# Patient Record
Sex: Female | Born: 1935
Health system: Southern US, Community
[De-identification: ages and names within clinical notes are randomized; demographics above are authoritative.]

## PROBLEM LIST (undated history)

## (undated) DIAGNOSIS — D219 Benign neoplasm of connective and other soft tissue, unspecified: Secondary | ICD-10-CM

## (undated) DIAGNOSIS — E559 Vitamin D deficiency, unspecified: Secondary | ICD-10-CM

## (undated) DIAGNOSIS — M1990 Unspecified osteoarthritis, unspecified site: Secondary | ICD-10-CM

## (undated) DIAGNOSIS — I10 Essential (primary) hypertension: Secondary | ICD-10-CM

## (undated) DIAGNOSIS — K5790 Diverticulosis of intestine, part unspecified, without perforation or abscess without bleeding: Secondary | ICD-10-CM

## (undated) DIAGNOSIS — E875 Hyperkalemia: Secondary | ICD-10-CM

## (undated) DIAGNOSIS — K2970 Gastritis, unspecified, without bleeding: Secondary | ICD-10-CM

## (undated) DIAGNOSIS — M25569 Pain in unspecified knee: Secondary | ICD-10-CM

## (undated) DIAGNOSIS — K219 Gastro-esophageal reflux disease without esophagitis: Secondary | ICD-10-CM

## (undated) HISTORY — DX: Vitamin D deficiency, unspecified: E55.9

## (undated) HISTORY — PX: CHOLECYSTECTOMY: SHX55

## (undated) HISTORY — DX: Gastro-esophageal reflux disease without esophagitis: K21.9

## (undated) HISTORY — PX: LITHOTRIPSY: SUR834

## (undated) HISTORY — DX: Hyperkalemia: E87.5

## (undated) HISTORY — DX: Unspecified osteoarthritis, unspecified site: M19.90

## (undated) HISTORY — PX: OTHER SURGICAL HISTORY: SHX169

## (undated) HISTORY — DX: Morbid (severe) obesity due to excess calories: E66.01

## (undated) HISTORY — DX: Diverticulosis of intestine, part unspecified, without perforation or abscess without bleeding: K57.90

## (undated) HISTORY — PX: TOTAL HIP ARTHROPLASTY: SHX124

## (undated) HISTORY — DX: Gastritis, unspecified, without bleeding: K29.70

## (undated) HISTORY — DX: Pain in unspecified knee: M25.569

## (undated) HISTORY — DX: Benign neoplasm of connective and other soft tissue, unspecified: D21.9

## (undated) HISTORY — DX: Essential (primary) hypertension: I10

---

## 1998-10-14 ENCOUNTER — Ambulatory Visit (HOSPITAL_COMMUNITY): Admission: RE | Admit: 1998-10-14 | Discharge: 1998-10-14 | Payer: Self-pay | Admitting: Internal Medicine

## 1998-10-14 ENCOUNTER — Encounter: Payer: Self-pay | Admitting: Internal Medicine

## 1999-11-24 ENCOUNTER — Other Ambulatory Visit: Admission: RE | Admit: 1999-11-24 | Discharge: 1999-11-24 | Payer: Self-pay | Admitting: *Deleted

## 2000-07-08 DIAGNOSIS — D219 Benign neoplasm of connective and other soft tissue, unspecified: Secondary | ICD-10-CM

## 2000-07-08 HISTORY — DX: Benign neoplasm of connective and other soft tissue, unspecified: D21.9

## 2000-08-02 ENCOUNTER — Encounter (INDEPENDENT_AMBULATORY_CARE_PROVIDER_SITE_OTHER): Payer: Self-pay | Admitting: Gastroenterology

## 2000-08-02 ENCOUNTER — Other Ambulatory Visit: Admission: RE | Admit: 2000-08-02 | Discharge: 2000-08-02 | Payer: Self-pay | Admitting: Gastroenterology

## 2001-10-06 ENCOUNTER — Encounter: Payer: Self-pay | Admitting: Gastroenterology

## 2001-10-06 ENCOUNTER — Ambulatory Visit (HOSPITAL_COMMUNITY): Admission: RE | Admit: 2001-10-06 | Discharge: 2001-10-06 | Payer: Self-pay | Admitting: Gastroenterology

## 2001-11-02 ENCOUNTER — Ambulatory Visit (HOSPITAL_COMMUNITY): Admission: RE | Admit: 2001-11-02 | Discharge: 2001-11-02 | Payer: Self-pay | Admitting: Gastroenterology

## 2001-11-02 ENCOUNTER — Encounter (INDEPENDENT_AMBULATORY_CARE_PROVIDER_SITE_OTHER): Payer: Self-pay | Admitting: Gastroenterology

## 2002-02-12 ENCOUNTER — Encounter (INDEPENDENT_AMBULATORY_CARE_PROVIDER_SITE_OTHER): Payer: Self-pay | Admitting: Gastroenterology

## 2002-08-29 ENCOUNTER — Encounter: Payer: Self-pay | Admitting: Orthopaedic Surgery

## 2002-09-04 ENCOUNTER — Inpatient Hospital Stay (HOSPITAL_COMMUNITY): Admission: RE | Admit: 2002-09-04 | Discharge: 2002-09-10 | Payer: Self-pay | Admitting: Orthopaedic Surgery

## 2002-09-10 ENCOUNTER — Inpatient Hospital Stay (HOSPITAL_COMMUNITY)
Admission: RE | Admit: 2002-09-10 | Discharge: 2002-09-17 | Payer: Self-pay | Admitting: Physical Medicine & Rehabilitation

## 2003-11-20 ENCOUNTER — Ambulatory Visit (HOSPITAL_COMMUNITY): Admission: RE | Admit: 2003-11-20 | Discharge: 2003-11-20 | Payer: Self-pay | Admitting: Internal Medicine

## 2004-03-16 ENCOUNTER — Ambulatory Visit (HOSPITAL_COMMUNITY): Admission: RE | Admit: 2004-03-16 | Discharge: 2004-03-16 | Payer: Self-pay | Admitting: Internal Medicine

## 2004-05-03 ENCOUNTER — Emergency Department (HOSPITAL_COMMUNITY): Admission: EM | Admit: 2004-05-03 | Discharge: 2004-05-03 | Payer: Self-pay | Admitting: Emergency Medicine

## 2004-05-18 ENCOUNTER — Ambulatory Visit: Payer: Self-pay | Admitting: Internal Medicine

## 2004-07-21 ENCOUNTER — Ambulatory Visit: Payer: Self-pay | Admitting: Internal Medicine

## 2004-08-26 ENCOUNTER — Ambulatory Visit: Payer: Self-pay | Admitting: Internal Medicine

## 2004-11-19 ENCOUNTER — Ambulatory Visit: Payer: Self-pay | Admitting: Internal Medicine

## 2004-11-26 ENCOUNTER — Ambulatory Visit: Payer: Self-pay | Admitting: Internal Medicine

## 2004-12-30 ENCOUNTER — Ambulatory Visit: Payer: Self-pay | Admitting: Internal Medicine

## 2005-01-05 ENCOUNTER — Ambulatory Visit: Payer: Self-pay

## 2005-01-12 ENCOUNTER — Ambulatory Visit: Payer: Self-pay

## 2005-02-23 ENCOUNTER — Inpatient Hospital Stay (HOSPITAL_COMMUNITY): Admission: RE | Admit: 2005-02-23 | Discharge: 2005-02-26 | Payer: Self-pay | Admitting: Orthopaedic Surgery

## 2005-02-26 ENCOUNTER — Ambulatory Visit: Payer: Self-pay | Admitting: Physical Medicine & Rehabilitation

## 2005-02-26 ENCOUNTER — Inpatient Hospital Stay
Admission: RE | Admit: 2005-02-26 | Discharge: 2005-03-06 | Payer: Self-pay | Admitting: Physical Medicine & Rehabilitation

## 2005-06-21 ENCOUNTER — Ambulatory Visit: Payer: Self-pay | Admitting: Internal Medicine

## 2005-06-21 ENCOUNTER — Inpatient Hospital Stay (HOSPITAL_COMMUNITY): Admission: EM | Admit: 2005-06-21 | Discharge: 2005-06-24 | Payer: Self-pay | Admitting: Emergency Medicine

## 2005-06-30 ENCOUNTER — Ambulatory Visit: Payer: Self-pay | Admitting: Internal Medicine

## 2005-08-04 ENCOUNTER — Ambulatory Visit: Payer: Self-pay | Admitting: Internal Medicine

## 2005-09-14 ENCOUNTER — Ambulatory Visit: Payer: Self-pay | Admitting: Internal Medicine

## 2005-12-02 ENCOUNTER — Ambulatory Visit: Payer: Self-pay | Admitting: Internal Medicine

## 2005-12-20 ENCOUNTER — Ambulatory Visit: Payer: Self-pay

## 2006-02-04 ENCOUNTER — Ambulatory Visit: Payer: Self-pay | Admitting: Internal Medicine

## 2006-04-06 ENCOUNTER — Ambulatory Visit: Payer: Self-pay | Admitting: Internal Medicine

## 2006-07-25 ENCOUNTER — Ambulatory Visit: Payer: Self-pay | Admitting: Internal Medicine

## 2006-07-25 LAB — CONVERTED CEMR LAB
BUN: 15 mg/dL (ref 6–23)
Calcium: 9.7 mg/dL (ref 8.4–10.5)
Creatinine, Ser: 0.7 mg/dL (ref 0.4–1.2)
GFR calc Af Amer: 105 mL/min
GFR calc non Af Amer: 87 mL/min

## 2006-08-10 ENCOUNTER — Encounter: Admission: RE | Admit: 2006-08-10 | Discharge: 2006-09-05 | Payer: Self-pay | Admitting: Internal Medicine

## 2006-10-12 ENCOUNTER — Ambulatory Visit: Payer: Self-pay | Admitting: Internal Medicine

## 2006-10-12 LAB — CONVERTED CEMR LAB
AST: 21 units/L (ref 0–37)
Albumin: 3.6 g/dL (ref 3.5–5.2)
BUN: 13 mg/dL (ref 6–23)
Basophils Relative: 1.9 % — ABNORMAL HIGH (ref 0.0–1.0)
Bilirubin, Direct: 0.2 mg/dL (ref 0.0–0.3)
CO2: 33 meq/L — ABNORMAL HIGH (ref 19–32)
Calcium: 9.5 mg/dL (ref 8.4–10.5)
Chloride: 106 meq/L (ref 96–112)
Eosinophils Absolute: 0.1 10*3/uL (ref 0.0–0.6)
GFR calc Af Amer: 90 mL/min
GFR calc non Af Amer: 74 mL/min
HCT: 37.6 % (ref 36.0–46.0)
Hgb A1c MFr Bld: 7.1 % — ABNORMAL HIGH (ref 4.6–6.0)
MCHC: 31.6 g/dL (ref 30.0–36.0)
MCV: 84.5 fL (ref 78.0–100.0)
Nitrite: NEGATIVE
Platelets: 204 10*3/uL (ref 150–400)
Potassium: 3.5 meq/L (ref 3.5–5.1)
Sodium: 145 meq/L (ref 135–145)
Specific Gravity, Urine: 1.02 (ref 1.000–1.03)
Total Bilirubin: 0.8 mg/dL (ref 0.3–1.2)
Total Protein, Urine: NEGATIVE mg/dL
Uric Acid, Serum: 10 mg/dL — ABNORMAL HIGH (ref 2.4–7.0)
Urobilinogen, UA: 0.2 (ref 0.0–1.0)

## 2006-10-18 ENCOUNTER — Emergency Department: Payer: Self-pay | Admitting: Emergency Medicine

## 2006-10-27 ENCOUNTER — Ambulatory Visit: Payer: Self-pay

## 2007-01-12 ENCOUNTER — Ambulatory Visit: Payer: Self-pay | Admitting: Internal Medicine

## 2007-01-12 LAB — CONVERTED CEMR LAB
BUN: 13 mg/dL (ref 6–23)
CO2: 33 meq/L — ABNORMAL HIGH (ref 19–32)
Chloride: 111 meq/L (ref 96–112)
Creatinine, Ser: 0.7 mg/dL (ref 0.4–1.2)
GFR calc non Af Amer: 86 mL/min
Glucose, Bld: 166 mg/dL — ABNORMAL HIGH (ref 70–99)
Magnesium: 1.8 mg/dL (ref 1.5–2.5)
Potassium: 3.9 meq/L (ref 3.5–5.1)
Sodium: 152 meq/L — ABNORMAL HIGH (ref 135–145)
Uric Acid, Serum: 11.7 mg/dL — ABNORMAL HIGH (ref 2.4–7.0)

## 2007-01-16 ENCOUNTER — Encounter: Payer: Self-pay | Admitting: Internal Medicine

## 2007-01-16 DIAGNOSIS — N183 Chronic kidney disease, stage 3 (moderate): Secondary | ICD-10-CM

## 2007-01-16 DIAGNOSIS — E119 Type 2 diabetes mellitus without complications: Secondary | ICD-10-CM | POA: Insufficient documentation

## 2007-01-16 DIAGNOSIS — M109 Gout, unspecified: Secondary | ICD-10-CM

## 2007-01-16 DIAGNOSIS — N39498 Other specified urinary incontinence: Secondary | ICD-10-CM | POA: Insufficient documentation

## 2007-01-16 DIAGNOSIS — Z860100 Personal history of colon polyps, unspecified: Secondary | ICD-10-CM | POA: Insufficient documentation

## 2007-01-16 DIAGNOSIS — E875 Hyperkalemia: Secondary | ICD-10-CM

## 2007-01-16 DIAGNOSIS — K219 Gastro-esophageal reflux disease without esophagitis: Secondary | ICD-10-CM | POA: Insufficient documentation

## 2007-01-16 DIAGNOSIS — E1122 Type 2 diabetes mellitus with diabetic chronic kidney disease: Secondary | ICD-10-CM

## 2007-01-16 DIAGNOSIS — I10 Essential (primary) hypertension: Secondary | ICD-10-CM

## 2007-01-16 DIAGNOSIS — E559 Vitamin D deficiency, unspecified: Secondary | ICD-10-CM

## 2007-01-16 DIAGNOSIS — M1990 Unspecified osteoarthritis, unspecified site: Secondary | ICD-10-CM

## 2007-03-21 ENCOUNTER — Encounter: Payer: Self-pay | Admitting: Internal Medicine

## 2007-03-21 DIAGNOSIS — R32 Unspecified urinary incontinence: Secondary | ICD-10-CM

## 2007-03-23 ENCOUNTER — Encounter: Payer: Self-pay | Admitting: Internal Medicine

## 2007-03-23 ENCOUNTER — Ambulatory Visit: Payer: Self-pay | Admitting: Internal Medicine

## 2007-03-23 DIAGNOSIS — H9319 Tinnitus, unspecified ear: Secondary | ICD-10-CM | POA: Insufficient documentation

## 2007-03-23 LAB — CONVERTED CEMR LAB
ALT: 13 units/L (ref 0–35)
Alkaline Phosphatase: 62 units/L (ref 39–117)
Chloride: 104 meq/L (ref 96–112)
Creatinine, Ser: 0.7 mg/dL (ref 0.4–1.2)
GFR calc Af Amer: 105 mL/min
Glucose, Bld: 177 mg/dL — ABNORMAL HIGH (ref 70–99)
Potassium: 3 meq/L — ABNORMAL LOW (ref 3.5–5.1)
Total Bilirubin: 1 mg/dL (ref 0.3–1.2)
Total Protein: 7.5 g/dL (ref 6.0–8.3)

## 2007-05-24 ENCOUNTER — Telehealth: Payer: Self-pay | Admitting: Internal Medicine

## 2007-06-08 HISTORY — PX: TOTAL HIP ARTHROPLASTY: SHX124

## 2007-07-05 ENCOUNTER — Encounter: Payer: Self-pay | Admitting: Internal Medicine

## 2007-07-05 ENCOUNTER — Ambulatory Visit: Payer: Self-pay | Admitting: Internal Medicine

## 2007-07-05 DIAGNOSIS — R209 Unspecified disturbances of skin sensation: Secondary | ICD-10-CM

## 2007-07-05 DIAGNOSIS — E538 Deficiency of other specified B group vitamins: Secondary | ICD-10-CM | POA: Insufficient documentation

## 2007-07-07 LAB — CONVERTED CEMR LAB
ALT: 13 units/L (ref 0–35)
AST: 18 units/L (ref 0–37)
Basophils Relative: 0.1 % (ref 0.0–1.0)
CO2: 32 meq/L (ref 19–32)
Chloride: 100 meq/L (ref 96–112)
Eosinophils Absolute: 0.1 10*3/uL (ref 0.0–0.6)
GFR calc Af Amer: 90 mL/min
Hemoglobin: 12 g/dL (ref 12.0–15.0)
Lymphocytes Relative: 32.2 % (ref 12.0–46.0)
MCHC: 31.6 g/dL (ref 30.0–36.0)
Monocytes Absolute: 0.5 10*3/uL (ref 0.2–0.7)
Monocytes Relative: 9 % (ref 3.0–11.0)
Neutro Abs: 2.9 10*3/uL (ref 1.4–7.7)
Potassium: 3.9 meq/L (ref 3.5–5.1)
RDW: 12.4 % (ref 11.5–14.6)
TSH: 2.78 microintl units/mL (ref 0.35–5.50)
Uric Acid, Serum: 8.7 mg/dL — ABNORMAL HIGH (ref 2.4–7.0)
Vitamin B-12: 211 pg/mL (ref 211–911)

## 2007-07-17 LAB — CONVERTED CEMR LAB: Vit D, 1,25-Dihydroxy: 12 — ABNORMAL LOW (ref 30–89)

## 2007-08-04 ENCOUNTER — Encounter: Payer: Self-pay | Admitting: Internal Medicine

## 2007-09-07 ENCOUNTER — Ambulatory Visit: Payer: Self-pay | Admitting: Internal Medicine

## 2007-09-07 DIAGNOSIS — M79609 Pain in unspecified limb: Secondary | ICD-10-CM | POA: Insufficient documentation

## 2007-09-11 LAB — CONVERTED CEMR LAB
BUN: 19 mg/dL (ref 6–23)
Creatinine, Ser: 0.9 mg/dL (ref 0.4–1.2)
Glucose, Bld: 159 mg/dL — ABNORMAL HIGH (ref 70–99)
Hgb A1c MFr Bld: 6.1 % — ABNORMAL HIGH (ref 4.6–6.0)

## 2007-10-02 ENCOUNTER — Telehealth: Payer: Self-pay | Admitting: Internal Medicine

## 2007-10-04 ENCOUNTER — Encounter: Payer: Self-pay | Admitting: Internal Medicine

## 2007-10-10 ENCOUNTER — Ambulatory Visit: Payer: Self-pay | Admitting: Internal Medicine

## 2007-10-19 ENCOUNTER — Inpatient Hospital Stay (HOSPITAL_COMMUNITY): Admission: RE | Admit: 2007-10-19 | Discharge: 2007-10-25 | Payer: Self-pay | Admitting: Orthopaedic Surgery

## 2007-10-20 ENCOUNTER — Ambulatory Visit: Payer: Self-pay | Admitting: Physical Medicine & Rehabilitation

## 2007-11-21 ENCOUNTER — Telehealth: Payer: Self-pay | Admitting: Internal Medicine

## 2007-11-27 ENCOUNTER — Encounter: Payer: Self-pay | Admitting: Internal Medicine

## 2007-11-29 ENCOUNTER — Ambulatory Visit: Payer: Self-pay | Admitting: Internal Medicine

## 2008-01-24 ENCOUNTER — Encounter: Payer: Self-pay | Admitting: Internal Medicine

## 2008-02-05 ENCOUNTER — Ambulatory Visit: Payer: Self-pay | Admitting: Internal Medicine

## 2008-02-05 LAB — CONVERTED CEMR LAB
BUN: 19 mg/dL (ref 6–23)
Calcium: 9.2 mg/dL (ref 8.4–10.5)
GFR calc non Af Amer: 86 mL/min
Glucose, Bld: 139 mg/dL — ABNORMAL HIGH (ref 70–99)
Potassium: 3.9 meq/L (ref 3.5–5.1)
Sodium: 144 meq/L (ref 135–145)

## 2008-02-14 ENCOUNTER — Ambulatory Visit: Payer: Self-pay | Admitting: Internal Medicine

## 2008-02-19 LAB — CONVERTED CEMR LAB
Ketones, ur: NEGATIVE mg/dL
Nitrite: NEGATIVE
Specific Gravity, Urine: >=1.03 (ref 1.000–1.03)
pH: 6 (ref 5.0–8.0)

## 2008-02-21 ENCOUNTER — Encounter: Payer: Self-pay | Admitting: Internal Medicine

## 2008-03-05 ENCOUNTER — Ambulatory Visit: Payer: Self-pay | Admitting: Internal Medicine

## 2008-05-10 ENCOUNTER — Ambulatory Visit: Payer: Self-pay | Admitting: Internal Medicine

## 2008-05-10 LAB — CONVERTED CEMR LAB
Alkaline Phosphatase: 70 units/L (ref 39–117)
BUN: 15 mg/dL (ref 6–23)
Chloride: 106 meq/L (ref 96–112)
GFR calc Af Amer: 125 mL/min
Hgb A1c MFr Bld: 6.2 % — ABNORMAL HIGH (ref 4.6–6.0)
Potassium: 4.3 meq/L (ref 3.5–5.1)
Total Bilirubin: 0.8 mg/dL (ref 0.3–1.2)

## 2008-05-16 ENCOUNTER — Ambulatory Visit: Payer: Self-pay | Admitting: Internal Medicine

## 2008-06-20 ENCOUNTER — Encounter: Admission: RE | Admit: 2008-06-20 | Discharge: 2008-06-20 | Payer: Self-pay | Admitting: Otolaryngology

## 2008-06-26 ENCOUNTER — Encounter (INDEPENDENT_AMBULATORY_CARE_PROVIDER_SITE_OTHER): Payer: Self-pay | Admitting: *Deleted

## 2008-06-26 ENCOUNTER — Ambulatory Visit (HOSPITAL_COMMUNITY): Admission: RE | Admit: 2008-06-26 | Discharge: 2008-06-26 | Payer: Self-pay | Admitting: Otolaryngology

## 2008-08-05 ENCOUNTER — Encounter: Payer: Self-pay | Admitting: Internal Medicine

## 2008-09-12 ENCOUNTER — Ambulatory Visit: Payer: Self-pay | Admitting: Internal Medicine

## 2008-09-12 LAB — CONVERTED CEMR LAB
BUN: 14 mg/dL (ref 6–23)
CO2: 29 meq/L (ref 19–32)
Calcium: 9.5 mg/dL (ref 8.4–10.5)
Chloride: 106 meq/L (ref 96–112)
GFR calc non Af Amer: 104.05 mL/min (ref >60)
Potassium: 3.8 meq/L (ref 3.5–5.1)

## 2008-09-17 ENCOUNTER — Ambulatory Visit: Payer: Self-pay | Admitting: Internal Medicine

## 2008-09-17 DIAGNOSIS — R1319 Other dysphagia: Secondary | ICD-10-CM | POA: Insufficient documentation

## 2008-09-17 DIAGNOSIS — L290 Pruritus ani: Secondary | ICD-10-CM | POA: Insufficient documentation

## 2008-09-17 DIAGNOSIS — R609 Edema, unspecified: Secondary | ICD-10-CM

## 2008-09-18 ENCOUNTER — Encounter (INDEPENDENT_AMBULATORY_CARE_PROVIDER_SITE_OTHER): Payer: Self-pay | Admitting: *Deleted

## 2008-10-16 ENCOUNTER — Ambulatory Visit: Payer: Self-pay | Admitting: Gastroenterology

## 2008-10-16 DIAGNOSIS — R932 Abnormal findings on diagnostic imaging of liver and biliary tract: Secondary | ICD-10-CM | POA: Insufficient documentation

## 2008-10-16 DIAGNOSIS — R933 Abnormal findings on diagnostic imaging of other parts of digestive tract: Secondary | ICD-10-CM

## 2008-10-17 ENCOUNTER — Encounter: Payer: Self-pay | Admitting: Gastroenterology

## 2008-10-17 ENCOUNTER — Ambulatory Visit: Payer: Self-pay | Admitting: Gastroenterology

## 2008-10-21 ENCOUNTER — Encounter: Payer: Self-pay | Admitting: Gastroenterology

## 2008-12-12 ENCOUNTER — Ambulatory Visit: Payer: Self-pay | Admitting: Internal Medicine

## 2008-12-13 LAB — CONVERTED CEMR LAB
BUN: 17 mg/dL (ref 6–23)
Calcium: 9.5 mg/dL (ref 8.4–10.5)
Chloride: 105 meq/L (ref 96–112)
Creatinine, Ser: 0.7 mg/dL (ref 0.4–1.2)
Creatinine,U: 143 mg/dL
GFR calc non Af Amer: 103.98 mL/min (ref >60)
Glucose, Bld: 146 mg/dL — ABNORMAL HIGH (ref 70–99)
Potassium: 3.8 meq/L (ref 3.5–5.1)

## 2008-12-17 ENCOUNTER — Ambulatory Visit: Payer: Self-pay | Admitting: Internal Medicine

## 2008-12-25 ENCOUNTER — Ambulatory Visit: Payer: Self-pay | Admitting: Gastroenterology

## 2008-12-25 DIAGNOSIS — K2990 Gastroduodenitis, unspecified, without bleeding: Secondary | ICD-10-CM

## 2008-12-25 DIAGNOSIS — K2970 Gastritis, unspecified, without bleeding: Secondary | ICD-10-CM | POA: Insufficient documentation

## 2009-04-14 ENCOUNTER — Ambulatory Visit: Payer: Self-pay | Admitting: Internal Medicine

## 2009-04-15 LAB — CONVERTED CEMR LAB
Albumin: 3.5 g/dL (ref 3.5–5.2)
Alkaline Phosphatase: 67 units/L (ref 39–117)
BUN: 15 mg/dL (ref 6–23)
CO2: 29 meq/L (ref 19–32)
Calcium: 9.3 mg/dL (ref 8.4–10.5)
Chloride: 105 meq/L (ref 96–112)
Creatinine, Ser: 0.7 mg/dL (ref 0.4–1.2)
Glucose, Bld: 167 mg/dL — ABNORMAL HIGH (ref 70–99)
Hgb A1c MFr Bld: 6.7 % — ABNORMAL HIGH (ref 4.6–6.5)
Sodium: 144 meq/L (ref 135–145)
Total Protein: 6.7 g/dL (ref 6.0–8.3)

## 2009-04-17 ENCOUNTER — Ambulatory Visit: Payer: Self-pay | Admitting: Internal Medicine

## 2009-04-17 DIAGNOSIS — Z87891 Personal history of nicotine dependence: Secondary | ICD-10-CM

## 2009-04-17 DIAGNOSIS — M25519 Pain in unspecified shoulder: Secondary | ICD-10-CM | POA: Insufficient documentation

## 2009-05-12 ENCOUNTER — Telehealth: Payer: Self-pay | Admitting: Gastroenterology

## 2009-05-12 ENCOUNTER — Encounter: Payer: Self-pay | Admitting: Internal Medicine

## 2009-05-21 ENCOUNTER — Ambulatory Visit: Payer: Self-pay | Admitting: Internal Medicine

## 2009-05-26 ENCOUNTER — Telehealth: Payer: Self-pay | Admitting: Internal Medicine

## 2009-05-26 ENCOUNTER — Emergency Department (HOSPITAL_COMMUNITY): Admission: EM | Admit: 2009-05-26 | Discharge: 2009-05-26 | Payer: Self-pay | Admitting: Emergency Medicine

## 2009-05-27 ENCOUNTER — Ambulatory Visit: Payer: Self-pay | Admitting: Internal Medicine

## 2009-06-11 ENCOUNTER — Encounter: Payer: Self-pay | Admitting: Internal Medicine

## 2009-06-12 ENCOUNTER — Ambulatory Visit: Payer: Self-pay | Admitting: Internal Medicine

## 2009-06-16 ENCOUNTER — Ambulatory Visit: Payer: Self-pay | Admitting: Internal Medicine

## 2009-06-17 ENCOUNTER — Encounter: Payer: Self-pay | Admitting: Internal Medicine

## 2009-06-20 ENCOUNTER — Encounter: Admission: RE | Admit: 2009-06-20 | Discharge: 2009-06-20 | Payer: Self-pay | Admitting: Neurosurgery

## 2009-07-01 ENCOUNTER — Telehealth: Payer: Self-pay | Admitting: Internal Medicine

## 2009-07-15 ENCOUNTER — Ambulatory Visit (HOSPITAL_COMMUNITY): Admission: RE | Admit: 2009-07-15 | Discharge: 2009-07-15 | Payer: Self-pay | Admitting: Neurosurgery

## 2009-08-21 ENCOUNTER — Ambulatory Visit: Payer: Self-pay | Admitting: Internal Medicine

## 2009-08-21 LAB — CONVERTED CEMR LAB
AST: 18 units/L (ref 0–37)
Alkaline Phosphatase: 70 units/L (ref 39–117)
BUN: 15 mg/dL (ref 6–23)
CO2: 30 meq/L (ref 19–32)
GFR calc non Af Amer: 103.79 mL/min (ref >60)
Glucose, Bld: 179 mg/dL — ABNORMAL HIGH (ref 70–99)
Hgb A1c MFr Bld: 7 % — ABNORMAL HIGH (ref 4.6–6.5)
LDL Cholesterol: 100 mg/dL — ABNORMAL HIGH (ref 0–99)

## 2009-08-25 ENCOUNTER — Ambulatory Visit: Payer: Self-pay | Admitting: Internal Medicine

## 2009-08-27 ENCOUNTER — Encounter: Payer: Self-pay | Admitting: Internal Medicine

## 2009-09-02 ENCOUNTER — Encounter: Payer: Self-pay | Admitting: Internal Medicine

## 2009-10-27 ENCOUNTER — Encounter: Payer: Self-pay | Admitting: Internal Medicine

## 2009-10-28 ENCOUNTER — Telehealth: Payer: Self-pay | Admitting: Internal Medicine

## 2009-11-25 ENCOUNTER — Ambulatory Visit: Payer: Self-pay | Admitting: Internal Medicine

## 2009-11-25 LAB — CONVERTED CEMR LAB
CO2: 28 meq/L (ref 19–32)
Glucose, Bld: 174 mg/dL — ABNORMAL HIGH (ref 70–99)
TSH: 2.61 microintl units/mL (ref 0.35–5.50)

## 2009-11-28 ENCOUNTER — Ambulatory Visit: Payer: Self-pay | Admitting: Internal Medicine

## 2010-03-31 ENCOUNTER — Ambulatory Visit: Payer: Self-pay | Admitting: Internal Medicine

## 2010-03-31 LAB — CONVERTED CEMR LAB
Alkaline Phosphatase: 63 units/L (ref 39–117)
BUN: 14 mg/dL (ref 6–23)
Basophils Absolute: 0 10*3/uL (ref 0.0–0.1)
Basophils Relative: 0 % (ref 0.0–3.0)
Bilirubin, Direct: 0.1 mg/dL (ref 0.0–0.3)
Calcium: 9.5 mg/dL (ref 8.4–10.5)
Chloride: 103 meq/L (ref 96–112)
Direct LDL: 126.1 mg/dL
GFR calc non Af Amer: 105.37 mL/min (ref >60)
Glucose, Bld: 205 mg/dL — ABNORMAL HIGH (ref 70–99)
Ketones, ur: NEGATIVE mg/dL
Lymphs Abs: 1.9 10*3/uL (ref 0.7–4.0)
Neutrophils Relative %: 55.5 % (ref 43.0–77.0)
Nitrite: NEGATIVE
Potassium: 3.7 meq/L (ref 3.5–5.1)
Total CHOL/HDL Ratio: 4
Total Protein, Urine: 30 mg/dL
Triglycerides: 132 mg/dL (ref 0.0–149.0)
Urine Glucose: NEGATIVE mg/dL
VLDL: 26.4 mg/dL (ref 0.0–40.0)
Vitamin B-12: 1226 pg/mL — ABNORMAL HIGH (ref 211–911)

## 2010-04-03 ENCOUNTER — Ambulatory Visit: Payer: Self-pay | Admitting: Internal Medicine

## 2010-04-21 ENCOUNTER — Encounter: Payer: Self-pay | Admitting: Internal Medicine

## 2010-04-23 ENCOUNTER — Encounter: Payer: Self-pay | Admitting: Internal Medicine

## 2010-04-27 ENCOUNTER — Encounter: Payer: Self-pay | Admitting: Internal Medicine

## 2010-04-28 ENCOUNTER — Encounter: Payer: Self-pay | Admitting: Internal Medicine

## 2010-06-30 ENCOUNTER — Ambulatory Visit
Admission: RE | Admit: 2010-06-30 | Discharge: 2010-06-30 | Payer: Self-pay | Source: Home / Self Care | Attending: Internal Medicine | Admitting: Internal Medicine

## 2010-06-30 ENCOUNTER — Other Ambulatory Visit: Payer: Self-pay | Admitting: Internal Medicine

## 2010-06-30 LAB — MICROALBUMIN / CREATININE URINE RATIO
Microalb Creat Ratio: 21.9 mg/g (ref 0.0–30.0)
Microalb, Ur: 26.2 mg/dL — ABNORMAL HIGH (ref 0.0–1.9)

## 2010-06-30 LAB — BASIC METABOLIC PANEL
Chloride: 107 mEq/L (ref 96–112)
GFR: 114.85 mL/min (ref >60.00)

## 2010-07-07 ENCOUNTER — Ambulatory Visit
Admission: RE | Admit: 2010-07-07 | Discharge: 2010-07-07 | Payer: Self-pay | Source: Home / Self Care | Attending: Internal Medicine | Admitting: Internal Medicine

## 2010-07-07 NOTE — Medication Information (Signed)
[facility]   Imported By: Lester Eatonton 05/04/2010 07:52:23  _____________________________________________________________________  External Attachment:    Type:   Image     Comment:   External Document

## 2010-07-07 NOTE — Letter (Signed)
Summary: [facility]  [facility]   Imported By: Lester Truxton 09/09/2009 11:02:13  _____________________________________________________________________  External Attachment:    Type:   Image     Comment:   External Document

## 2010-07-07 NOTE — Progress Notes (Signed)
  Phone Note Refill Request  on Oct 28, 2009 11:02 AM  Refills Requested: Medication #1:  TOPROL XL 100 MG TB24 Take 1 tablet by mouth once a day   Dosage confirmed as above?Dosage Confirmed   Notes: [facility]  Medication #2:  VERAPAMIL HCL CR 360 MG  CP24 once daily   Dosage confirmed as above?Dosage Confirmed   Notes: [facility]  Medication #3:  KLOR-CON M10 10 MEQ CR-TABS 1 by mouth qd.   Dosage confirmed as above?Dosage Confirmed   Notes: [facility] Initial call taken by: Scharlene Gloss,  Oct 28, 2009 11:02 AM    Prescriptions: TOPROL XL 100 MG TB24 (METOPROLOL SUCCINATE) Take 1 tablet by mouth once a day  #90 x 0   Entered by:   Scharlene Gloss   Authorized by:   Tresa Garter MD   Signed by:   Scharlene Gloss on 10/28/2009   Method used:   Faxed to ...       [facility] (mail-order)             , [place]         Ph: [phone]       Fax: [phone]   RxID:   [id number] KLOR-CON M10 10 MEQ CR-TABS (POTASSIUM CHLORIDE CRYS CR) 1 by mouth qd  #90 x 0   Entered by:   Scharlene Gloss   Authorized by:   Tresa Garter MD   Signed by:   Scharlene Gloss on 10/28/2009   Method used:   Faxed to ...       [facility] (mail-order)             , [place]         Ph: [phone]       Fax: [phone]   RxID:   [id number] VERAPAMIL HCL CR 360 MG  CP24 (VERAPAMIL HCL) once daily  #90 x 0   Entered by:   Zella Ball Ewing   Authorized by:   Tresa Garter MD   Signed by:   Scharlene Gloss on 10/28/2009   Method used:   Faxed to ...       [facility] (mail-order)             , [place]         Ph: [phone]       Fax: [phone]   RxID:   [id number]

## 2010-07-07 NOTE — Assessment & Plan Note (Signed)
Summary: 4 MO ROV /NWS  #   Vital Signs:  Patient profile:   77 year old female Height:      62.5 inches Weight:      259 pounds BMI:     46.79 Temp:     98.7 degrees F oral Pulse rate:   88 / minute Pulse rhythm:   regular Resp:     16 per minute BP sitting:   140 / 84  (left arm) Cuff size:   large  Vitals Entered By: Lanier Prude, CMA(AAMA) (April 03, 2010 10:09 AM) CC: MWV Is Patient Diabetic? Yes   Primary Care Provider:  Sonda Primes, MD  CC:  [facility].  History of Present Illness: The patient presents for a preventive health examination Patient past medical history, social history, and family history reviewed in detail no significant changes.  Patient is physically not very active. Depression is negative and mood is good. Hearing is normal, and able to perform activities of daily living. Risk of falling is low and home safety has been reviewed and is appropriate. Patient has normal height, she is overweight, and visual acuity is decreased. Patient has been counseled on age-appropriate routine health concerns for screening and prevention. Education, counseling done.  The patient presents for a follow up of hypertension, diabetes, hyperlipidemia   Preventive Screening-Counseling & Management  Alcohol-Tobacco     Alcohol drinks/day: 0     Smoking Status: never  Caffeine-Diet-Exercise     Caffeine Counseling: not indicated; caffeine use is not excessive or problematic     Diet Counseling: to improve diet; diet is suboptimal     Does Patient Exercise: no     Exercise Counseling: to improve exercise regimen     Depression Counseling: not indicated; screening negative for depression  Hep-HIV-STD-Contraception     Hepatitis Risk: no risk noted     Sun Exposure-Excessive: no  Safety-Violence-Falls     Seat Belt Use: yes     Violence in the Home: no risk noted     Sexual Abuse Counseling: no     Fall Risk Counseling: counseling provided; falls with injury  noted  Current Medications (verified): 1)  Amaryl 4 Mg  Tabs (Glimepiride) .[name].. 1 Tab Two Times A Day 2)  Verapamil Hcl Cr 360 Mg  Cp24 (Verapamil Hcl) .... Once Daily 3)  Toprol Xl 100 Mg Tb24 (Metoprolol Succinate) .... Take 1 Tablet By Mouth Once A Day 4)  Allopurinol 300 Mg Tabs (Allopurinol) .... Take 1 Tab Every Day 5)  Vitamin B-12 1000 Mcg  Tabs (Cyanocobalamin) .[name].. 1 Qd 6)  Vitamin D3 1000 Unit  Tabs (Cholecalciferol) .[name].. 1 Qd 7)  Baby Aspirin 81 Mg  Chew (Aspirin) .... Once Daily 8)  Klor-Con M10 10 Meq Cr-Tabs (Potassium Chloride Crys Cr) .[name].. 1 By Mouth Qd  Allergies (verified): 1)  ! 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  268.9 hyperkalemia Gastritis with hx of H. Pylori Diverticulosis  Family History: Last updated: 12/25/2008 Family History Diabetes 1st degree relative Family History of Breast Cancer: Sister No FH of Colon Cancer: Family History of Stomach Cancer: Sister Family History of Diabetes: Sister  Social History: Last updated: 12/25/2008 Retired Single Alcohol Use - no Illicit Drug Use - no Patient is a former smoker. -stopped over 50 years ago Patient does not get regular  exercise.   Past Surgical History: right tkr thr left  3/4 colonoscopy  02 Total hip replacement R 2009  Cholecystectomy Hip Replacement(L) Lithotripsy Colonosc 2011 Dr Juanda Chance  Social History: Smoking Status:  never Hepatitis Risk:  no risk noted Sun Exposure-Excessive:  no Seat Belt Use:  yes  Review of Systems       The patient complains of weight gain and difficulty walking.  The patient denies anorexia, fever, weight loss, vision loss, decreased hearing, hoarseness, chest pain, syncope, dyspnea on exertion, peripheral edema, prolonged  cough, headaches, hemoptysis, abdominal pain, melena, hematochezia, severe indigestion/heartburn, hematuria, incontinence, genital sores, muscle weakness, suspicious skin lesions, transient blindness, depression, unusual weight change, abnormal bleeding, enlarged lymph nodes, angioedema, and breast masses.    Physical Exam  General:  Well developed, no acute distress. obese.   Head:  Normocephalic and atraumatic without obvious abnormalities. No apparent alopecia or balding. Eyes:  No corneal or conjunctival inflammation noted. EOMI. Perrla. Ears:  External ear exam shows no significant lesions or deformities.  Otoscopic examination reveals clear canals, tympanic membranes are intact bilaterally without bulging, retraction, inflammation or discharge. Hearing is grossly normal bilaterally. Nose:  External nasal examination shows no deformity or inflammation. Nasal mucosa are pink and moist without lesions or exudates. Mouth:  Oral mucosa and oropharynx without lesions or exudates.  Teeth in good repair. Neck:  supple.   Lungs:  Clear throughout to auscultation. Heart:  Regular rate and rhythm; no murmurs, rubs,  or bruits. Abdomen:  Soft, nontender and nondistended. No masses, hepatosplenomegaly or hernias noted. Normal bowel sounds. Msk:  B knees w/OA deformities, using a cane Neck, L shouder NT w/ROM Extremities:  trace left pedal edema and trace right pedal edema.   Neurologic:  Alert and  oriented x4;  grossly normal neurologically; L grip (?) 5-/5 Skin:  Clear Cervical Nodes:  No lymphadenopathy noted Psych:  Alert and cooperative. Normal mood and affect.  Diabetes Management Exam:    Foot Exam (with socks and/or shoes not present):       Sensory-Pinprick/Light touch:          Left medial foot (L-4): normal          Left dorsal foot (L-5): normal          Left lateral foot (S-1): normal          Right medial foot (L-4): normal          Right dorsal foot (L-5): normal          Right  lateral foot (S-1): normal       Sensory-Monofilament:          Left foot: normal          Right foot: normal       Inspection:          Left foot: normal          Right foot: normal       Nails:          Left foot: normal          Right foot: normal   Impression & Recommendations:  Problem # 1:  Preventive Health Care (ICD-V70.0) Assessment New Overall doing well, age appropriate education and counseling updated and referral for appropriate preventive services done unless declined, immunizations up to date or declined, diet counseling done if overweight, urged to quit smoking if smokes, most recent labs reviewed and current ordered if appropriate, ecg reviewed or declined (interpretation per ECG scanned  in the EMR if done); information regarding Medicare Preventation requirements given if appropriate. Colon up to date The labs were reviewed with the patient.   Problem # 2:  B12 DEFICIENCY (ICD-266.2) Assessment: Improved On the regimen of medicine(s) reflected in the chart    Problem # 3:  DIABETES MELLITUS, TYPE II (ICD-250.00) Assessment: Unchanged  Her updated medication list for this problem includes:    Metformin Hcl 500 Mg Tabs (Metformin hcl) .[name]... 1 by mouth two times a day for diabetes    Amaryl 4 Mg Tabs (Glimepiride) .[name]... 1 tab two times a day    Baby Aspirin 81 Mg Chew (Aspirin) ..... Once daily  Labs Reviewed: Creat: 0.7 (03/31/2010)    Reviewed HgBA1c results: 7.7 (03/31/2010)  6.8 (11/25/2009)  Problem # 4:  HYPERTENSION (ICD-401.9) Assessment: Unchanged  Her updated medication list for this problem includes:    Verapamil Hcl Cr 360 Mg Cp24 (Verapamil hcl) ..... Once daily    Toprol Xl 100 Mg Tb24 (Metoprolol succinate) .[name]... Take 1 tablet by mouth once a day  BP today: 140/84 Prior BP: 120/82 (11/28/2009)  Prior 10 Yr Risk Heart Disease: Not enough information (10/16/2008)  Labs Reviewed: K+: 3.7 (03/31/2010) Creat: : 0.7 (03/31/2010)   Chol: 209  (03/31/2010)   HDL: 58.70 (03/31/2010)   LDL: 100 (08/21/2009)   TG: 132.0 (03/31/2010)  Complete Medication List: 1)  Metformin Hcl 500 Mg Tabs (Metformin hcl) .[name].. 1 by mouth two times a day for diabetes 2)  Amaryl 4 Mg Tabs (Glimepiride) .[name].. 1 tab two times a day 3)  Verapamil Hcl Cr 360 Mg Cp24 (Verapamil hcl) .... Once daily 4)  Toprol Xl 100 Mg Tb24 (Metoprolol succinate) .... Take 1 tablet by mouth once a day 5)  Allopurinol 300 Mg Tabs (Allopurinol) .... Take 1 tab every day 6)  Vitamin B-12 1000 Mcg Tabs (Cyanocobalamin) .[name].. 1 qd 7)  Vitamin D3 1000 Unit Tabs (Cholecalciferol) .[name].. 1 qd 8)  Baby Aspirin 81 Mg Chew (Aspirin) .... Once daily 9)  Klor-con M10 10 Meq Cr-tabs (Potassium chloride crys cr) .[name].. 1 by mouth qd  Other Orders: Tdap => 4yrs IM (24401) Admin 1st Vaccine (02725) Flu Vaccine 75yrs + MEDICARE PATIENTS [phone]) Administration Flu vaccine - MCR (I3474) Medicare -1st Annual Wellness Visit [phone])  Patient Instructions: 1)  Please schedule a follow-up appointment in 3 months. 2)  BMP prior to visit, ICD-9: 3)  HbgA1C prior to visit, ICD-9:250.00 4)  Urine Microalbumin prior to visit, ICD-9: Prescriptions: METFORMIN HCL 500 MG TABS (METFORMIN HCL) 1 by mouth two times a day for diabetes  #60 x 12   Entered and Authorized by:   Tresa Garter MD   Signed by:   Tresa Garter MD on 04/03/2010   Method used:   Electronically to        [facility] [address] #[id number]* (retail)       [address] [place], [place]  [address]       Ph: [phone]       Fax: [phone]   RxID:   [id number] KLOR-CON M10 10 MEQ CR-TABS (POTASSIUM CHLORIDE CRYS CR) 1 by mouth qd  #90 x 3   Entered and Authorized by:   Tresa Garter MD   Signed by:   Tresa Garter MD on 04/03/2010   Method used:   Electronically to        [facility] [address] #[id number]* (retail)  [address]       [place], [place]  [address]       Ph: [phone]       Fax:  [phone]   RxID:   [id number]    Orders Added: 1)  Tdap => 74yrs IM [90715] 2)  Admin 1st Vaccine [90471] 3)  Flu Vaccine 76yrs + MEDICARE PATIENTS [Q2039] 4)  Administration Flu vaccine - MCR [G0008] 5)  Medicare -1st Annual Wellness Visit [G0438] 6)  Est. Patient Level IV [[address]]   Immunizations Administered:  Tetanus Vaccine:    Vaccine Type: Tdap    Site: left deltoid    Mfr: GlaxoSmithKline    Dose: 0.5 ml    Route: IM    Given by: Lanier Prude, CMA(AAMA)    Exp. Date: 03/26/2012    Lot #: [id number]    VIS given: 04/24/08 version given April 03, 2010.   Immunizations Administered:  Tetanus Vaccine:    Vaccine Type: Tdap    Site: left deltoid    Mfr: GlaxoSmithKline    Dose: 0.5 ml    Route: IM    Given by: Lanier Prude, CMA(AAMA)    Exp. Date: 03/26/2012    Lot #: [id number]    VIS given: 04/24/08 version given April 03, 2010.  Influenza Vaccine (to be given today)  Flu Vaccine Consent Questions     Do you have a history of severe allergic reactions to this vaccine? no    Any prior history of allergic reactions to egg and/or gelatin? no    Do you have a sensitivity to the preservative Thimersol? no    Do you have a past history of Guillan-Barre Syndrome? no    Do you currently have an acute febrile illness? no    Have you ever had a severe reaction to latex? no    Vaccine information given and explained to patient? yes    Are you currently pregnant? no    Lot Number:[id number]   Exp Date:12/05/2010   Site Given  Left Right Deltoid IM Lanier Prude, [facility])  April 03, 2010 10:46 AM

## 2010-07-07 NOTE — Medication Information (Signed)
Summary: [facility]  [facility]   Imported By: Sherian Rein 10/31/2009 15:15:41  _____________________________________________________________________  External Attachment:    Type:   Image     Comment:   External Document

## 2010-07-07 NOTE — Medication Information (Signed)
Summary: [facility]  [facility]   Imported By: Sherian Rein 04/28/2010 07:42:14  _____________________________________________________________________  External Attachment:    Type:   Image     Comment:   External Document

## 2010-07-07 NOTE — Assessment & Plan Note (Signed)
Summary: 2 WK ROV Natale Milch  PT WANTED THIS WEEK/NWS  #   Vital Signs:  Patient profile:   77 year old female Weight:      249 pounds Temp:     97.3 degrees F oral Pulse rate:   57 / minute BP sitting:   145 / 76  (left arm)  Vitals Entered By: Tora Perches (June 16, 2009 1:19 PM) CC: f/u Is Patient Diabetic? Yes   Primary Care Provider:  Sonda Primes, MD  CC:  f/u.  History of Present Illness: F/u L arm tingling and L hand tingling in fingers 4-5 - not better. C/o pain in L upper shoulder blade when would lie down.  Preventive Screening-Counseling & Management  Alcohol-Tobacco     Smoking Status: quit  Current Medications (verified): 1)  Amaryl 4 Mg  Tabs (Glimepiride) .... Two Times A Day 2)  Verapamil Hcl Cr 360 Mg  Cp24 (Verapamil Hcl) .... Once Daily 3)  Toprol Xl 100 Mg Tb24 (Metoprolol Succinate) .... Take 1 Tablet By Mouth Once A Day 4)  Allopurinol 300 Mg Tabs (Allopurinol) .... Take 1 Tab Every Day 5)  Vitamin B-12 1000 Mcg  Tabs (Cyanocobalamin) .[name].. 1 Qd 6)  Vitamin D3 1000 Unit  Tabs (Cholecalciferol) .[name].. 1 Qd 7)  Baby Aspirin 81 Mg  Chew (Aspirin) .... Once Daily 8)  Oxybutynin Chloride 5 Mg Xr24h-Tab (Oxybutynin Chloride) .[name].. 1 By Mouth Qd 9)  Colchicine 0.6 Mg Tabs (Colchicine) .[name].. 1 By Mouth Qid As Needed Gout Attack 10)  Omeprazole 40 Mg  Cpdr (Omeprazole) .[name].. 1 Each Day 30 Minutes Before Meal 11)  Potassium Chloride Cr 10 Meq  Tbcr (Potassium Chloride) .[name].. 1 By Mouth Qd 12)  Hydrocortisone 1 % Crea (Hydrocortisone) .... Apply To Rectum 2-3 Times Daily As Needed 13)  Reglan 10 Mg  Tabs (Metoclopramide Hcl) .... As Per Prep Instructions. 14)  Dulcolax 5 Mg  Tbec (Bisacodyl) .... Day Before Procedure Take 2 At 3pm and 2 At 8pm. 15)  Naproxen 250 Mg Tabs (Naproxen) .[name].. 1 By Mouth Two Times A Day Prn 16)  Prednisone 10 Mg Tabs (Prednisone) .... Take 40mg  Qd For 3 Days, Then 20 Mg Qd For 3 Days, Then 10mg  Qd For 6 Days, Then Stop. Take  Pc.  Allergies: 1)  ! 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  268.9 hyperkalemia Gastritis with hx of H. Pylori Diverticulosis  Social History: Last updated: 12/25/2008 Retired Single Alcohol Use - no Illicit Drug Use - no Patient is a former smoker. -stopped over 50 years ago Patient does not get regular exercise.   Review of Systems  The patient denies fever, chest pain, dyspnea on exertion, and abdominal pain.    Physical Exam  General:  Well developed, no acute distress. obese.   Nose:  External nasal examination shows no deformity or inflammation. Nasal mucosa are pink and moist without lesions or exudates. Mouth:  Oral mucosa and oropharynx without lesions or exudates.  Teeth in good repair. Neck:  supple.   Chest Wall:  No deformities, masses, or tenderness noted. Lungs:  Clear throughout to auscultation. Heart:  Regular rate and rhythm; no murmurs, rubs,  or bruits. Abdomen:  Soft, nontender and nondistended. No masses, hepatosplenomegaly or hernias noted. Normal bowel sounds. Msk:  B knees w/OA deformities, using a cane  Neck, L shouder NT w/ROM Neurologic:  Alert and  oriented x4;  grossly normal neurologically; L grip (?) 5-/5 Skin:  Clear Psych:  Alert and cooperative. Normal mood and affect.   Impression & Recommendations:  Problem # 1:  SHOULDER PAIN (ICD-719.41) L Assessment Unchanged MRI is pending and  a f/up appt  w/Dr Jordan Likes is pending  The following medications were removed from the medication list:    Naproxen 250 Mg Tabs (Naproxen) .[name]... 1 by mouth two times a day prn Her updated medication list for this problem includes:    Baby Aspirin 81 Mg Chew (Aspirin) ..... Once daily    Tramadol Hcl 50 Mg Tabs (Tramadol hcl) .[name]... 1-2  tabs by mouth two times a day as needed pain  Problem # 2:  PARESTHESIA (ICD-782.0) L hand Assessment: Unchanged  Problem # 3:  B12 DEFICIENCY (ICD-266.2) Assessment: Comment Only On prescription therapy   Problem # 4:  DIABETES MELLITUS, TYPE II (ICD-250.00) Assessment: Comment Only  Her updated medication list for this problem includes:    Amaryl 4 Mg Tabs (Glimepiride) .[name]..[name] Two times a day    Baby Aspirin 81 Mg Chew (Aspirin) ..... Once daily  Labs Reviewed: Creat: 0.7 (04/14/2009)    Reviewed HgBA1c results: 6.7 (04/14/2009)  6.6 (12/12/2008)  Complete Medication List: 1)  Amaryl 4 Mg Tabs (Glimepiride) .... Two times a day 2)  Verapamil Hcl Cr 360 Mg Cp24 (Verapamil hcl) .... Once daily 3)  Toprol Xl 100 Mg Tb24 (Metoprolol succinate) .... Take 1 tablet by mouth once a day 4)  Allopurinol 300 Mg Tabs (Allopurinol) .... Take 1 tab every day 5)  Vitamin B-12 1000 Mcg Tabs (Cyanocobalamin) .[name].. 1 qd 6)  Vitamin D3 1000 Unit Tabs (Cholecalciferol) .[name].. 1 qd 7)  Baby Aspirin 81 Mg Chew (Aspirin) .... Once daily 8)  Oxybutynin Chloride 5 Mg Xr24h-tab (Oxybutynin chloride) .[name].. 1 by mouth qd 9)  Colchicine 0.6 Mg Tabs (Colchicine) .[name].. 1 by mouth qid as needed gout attack 10)  Omeprazole 40 Mg Cpdr (Omeprazole) .[name].. 1 each day 30 minutes before meal 11)  Potassium Chloride Cr 10 Meq Tbcr (Potassium chloride) .[name].. 1 by mouth qd 12)  Hydrocortisone 1 % Crea (Hydrocortisone) .... Apply to rectum 2-3 times daily as needed 13)  Reglan 10 Mg Tabs (Metoclopramide hcl) .... As per prep instructions. 14)  Dulcolax 5 Mg Tbec (Bisacodyl) .... Day before procedure take 2 at 3pm and 2 at 8pm. 15)  Tramadol Hcl 50 Mg Tabs (Tramadol hcl) .[name].. 1-2 tabs by mouth two times a day as needed pain  Patient Instructions: 1)  Please schedule a follow-up appointment in 2 months. 2)  BMP prior to visit, ICD-9: 3)  Hepatic Panel prior to visit, ICD-9: 4)  Lipid Panel prior to visit, ICD-9: 5)  HbgA1C  prior to visit, ICD-9: 6)  Vit B12  266.20 250.00 7)  Call if you are not better in a reasonable amount of time or if worse. Go to ER if feeling really bad! Prescriptions: AMARYL 4 MG  TABS (GLIMEPIRIDE) two times a day  #60.0 Each x 12   Entered and Authorized by:   Tresa Garter MD   Signed by:   Tresa Garter MD on 06/16/2009   Method used:   Electronically to        [facility] #[id number]* (retail)       [address]       [place], [place]  [address]  Ph: [phone]       Fax: [phone]   RxID:   [id number] TRAMADOL HCL 50 MG TABS (TRAMADOL HCL) 1-2 tabs by mouth two times a day as needed pain  #60 x 3   Entered and Authorized by:   Tresa Garter MD   Signed by:   Tresa Garter MD on 06/16/2009   Method used:   Print then Give to Patient   RxID:   [id number]

## 2010-07-07 NOTE — Progress Notes (Signed)
Summary: JURY DUTY  Phone Note Call from Patient   Summary of Call: Pt's daughter called regarding jury duty summons and wanted a note from dr. I informed her that pt only needs to check the back of the form and write her own letter requesting permanent removal from list. Pt is over 77 yrs old and this will excuse her w/o a dr's note. Daughter understands.  Initial call taken by: Lamar Sprinkles, CMA,  July 01, 2009 11:49 AM  Follow-up for Phone Call        Thx Follow-up by: Tresa Garter MD,  July 01, 2009 1:11 PM

## 2010-07-07 NOTE — Procedures (Signed)
Summary: Colonoscopy  Patient: Holly Banks Note: All result statuses are Final unless otherwise noted.  Tests: (1) Colonoscopy (COL)   COL Colonoscopy           DONE     [facility]     [address]     [place], [place]  [address]           COLONOSCOPY PROCEDURE REPORT           PATIENT:  Holly, Banks  MR#:  [id number]     BIRTHDATE:  [birth year], 80 yrs. old  GENDER:  female           ENDOSCOPIST:  Hedwig Morton. Juanda Chance, MD     Referred by:           PROCEDURE DATE:  06/12/2009     PROCEDURE:  Colonoscopy [id number]     ASA CLASS:  Class II     INDICATIONS:  hematochezia rectal pruritus     last colonoscopy 2002 rectal polyp /leiomyoma           MEDICATIONS:   Versed 6 mg, Fentanyl 75 mcg           DESCRIPTION OF PROCEDURE:   After the risks benefits and     alternatives of the procedure were thoroughly explained, informed     consent was obtained.  Digital rectal exam was performed and     revealed no rectal masses.   The LB [id number] endoscope     was introduced through the anus and advanced to the cecum, which     was identified by both the appendix and ileocecal valve, without     limitations.  The quality of the prep was good, using MiraLax.     The instrument was then slowly withdrawn as the colon was fully     examined.     <<PROCEDUREIMAGES>>           FINDINGS:  Mild diverticulosis was found throughout the colon (see     image1, image2, and image7).  A sessile polyp was found in the     descending colon. at 40 cm 1 cm sessile polyp Polyp was snared,     then cauterized with monopolar cautery. Retrieval was successful     (see image8). snare polyp  This was otherwise a normal examination     of the colon (see image9, image6, image5, and image4). no internal     hemorrhoids   Retroflexed views in the rectum revealed no     abnormalities.    The scope was then withdrawn from the patient     and the procedure completed.           COMPLICATIONS:   None           ENDOSCOPIC IMPRESSION:     1) Mild diverticulosis throughout the colon     2) Sessile polyp in the descending colon     3) Otherwise normal examination     RECOMMENDATIONS:     1) Await pathology results     continue Hydrocortisone 1% cream prn rectal irritation           REPEAT EXAM:  In 7 year(s) for.           ______________________________     Hedwig Morton. Juanda Chance, MD           CC:           n.     eSIGNED:  Hedwig Morton. Temeka Pore at 06/12/2009 04:57 PM           [name], [name], [id number]  Note: An exclamation mark (!) indicates a result that was not dispersed into the flowsheet. Document Creation Date: 06/12/2009 4:57 PM _______________________________________________________________________  (1) Order result status: Final Collection or observation date-time: 06/12/2009 16:48 Requested date-time:  Receipt date-time:  Reported date-time:  Referring Physician:   Ordering Physician: Lina Sar [phone]) Specimen Source:  Source: Launa Grill Order Number: [id number] Lab site:   Appended Document: Colonoscopy     Procedures Next Due Date:    Colonoscopy: 06/2014

## 2010-07-07 NOTE — Assessment & Plan Note (Signed)
Summary: 3 mo rov /nws   Vital Signs:  Patient profile:   77 year old female Weight:      254 pounds BMI:     45.88 O2 Sat:      94 % on Room air Temp:     98.6 degrees F oral Pulse rate:   70 / minute Resp:     16 per minute BP sitting:   120 / 82  (left arm) Cuff size:   large  Vitals Entered By: Lanier Prude, CMA(AAMA) (November 28, 2009 11:04 AM)  O2 Flow:  Room air CC: f/u Comments has questions re: sig on Amaryl   Primary Care Provider:  Sonda Primes, MD  CC:  f/u.  History of Present Illness: The patient presents for a follow up of hypertension, diabetes, hyperlipidemia, OA   Current Medications (verified): 1)  Amaryl 4 Mg  Tabs (Glimepiride) .... Two Times A Day 2)  Verapamil Hcl Cr 360 Mg  Cp24 (Verapamil Hcl) .... Once Daily 3)  Toprol Xl 100 Mg Tb24 (Metoprolol Succinate) .... Take 1 Tablet By Mouth Once A Day 4)  Allopurinol 300 Mg Tabs (Allopurinol) .... Take 1 Tab Every Day 5)  Vitamin B-12 1000 Mcg  Tabs (Cyanocobalamin) .[name].. 1 Qd 6)  Vitamin D3 1000 Unit  Tabs (Cholecalciferol) .[name].. 1 Qd 7)  Baby Aspirin 81 Mg  Chew (Aspirin) .... Once Daily 8)  Klor-Con M10 10 Meq Cr-Tabs (Potassium Chloride Crys Cr) .[name].. 1 By Mouth Qd  Allergies (verified): 1)  ! 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  268.9 hyperkalemia Gastritis with hx of H. Pylori Diverticulosis  Social History: Last updated: 12/25/2008 Retired Single Alcohol Use - no Illicit Drug Use - no Patient is a former smoker. -stopped over 50 years ago Patient does not get regular exercise.   Review of Systems  The patient denies fever, weight gain, syncope, and abdominal pain.    Physical Exam  General:  Well developed, no acute distress. obese.   Mouth:  Oral  mucosa and oropharynx without lesions or exudates.  Teeth in good repair. Neck:  supple.   Lungs:  Clear throughout to auscultation. Heart:  Regular rate and rhythm; no murmurs, rubs,  or bruits. Abdomen:  Soft, nontender and nondistended. No masses, hepatosplenomegaly or hernias noted. Normal bowel sounds. Msk:  B knees w/OA deformities, using a cane Neck, L shouder NT w/ROM Extremities:  trace left pedal edema and trace right pedal edema.   Neurologic:  Alert and  oriented x4;  grossly normal neurologically; L grip (?) 5-/5 Skin:  Clear Psych:  Alert and cooperative. Normal mood and affect.   Impression & Recommendations:  Problem # 1:  HYPERTENSION (ICD-401.9) Assessment Improved  Her updated medication list for this problem includes:    Verapamil Hcl Cr 360 Mg Cp24 (Verapamil hcl) ..... Once daily    Toprol Xl 100 Mg Tb24 (Metoprolol succinate) .[name]... Take 1 tablet by mouth once a day  BP today: 120/82 Prior BP: 166/86 (08/25/2009)  Prior 10 Yr Risk Heart Disease: Not enough information (10/16/2008)  Labs Reviewed: K+: 4.0 (11/25/2009) Creat: : 0.7 (11/25/2009)   Chol: 194 (08/21/2009)   HDL: 66.70 (08/21/2009)   LDL: 100 (08/21/2009)   TG: 136.0 (08/21/2009)  Problem # 2:  B12 DEFICIENCY (ICD-266.2) Assessment: Improved On prescription drug  therapy  Problem # 3:  OSTEOARTHRITIS (ICD-715.90) Assessment: Unchanged  Her updated medication list for this problem includes:    Baby Aspirin 81 Mg Chew (Aspirin) ..... Once daily  Problem # 4:  DIABETES MELLITUS, TYPE II (ICD-250.00) Assessment: Comment Only  Her updated medication list for this problem includes:    Amaryl 4 Mg Tabs (Glimepiride) .[name]... 1 tab two times a day    Baby Aspirin 81 Mg Chew (Aspirin) ..... Once daily  Labs Reviewed: Creat: 0.7 (11/25/2009)    Reviewed HgBA1c results: 6.8 (11/25/2009)  7.0 (08/21/2009)  Complete Medication List: 1)  Amaryl 4 Mg Tabs (Glimepiride) .[name].. 1 tab two times a  day 2)  Verapamil Hcl Cr 360 Mg Cp24 (Verapamil hcl) .... Once daily 3)  Toprol Xl 100 Mg Tb24 (Metoprolol succinate) .... Take 1 tablet by mouth once a day 4)  Allopurinol 300 Mg Tabs (Allopurinol) .... Take 1 tab every day 5)  Vitamin B-12 1000 Mcg Tabs (Cyanocobalamin) .[name].. 1 qd 6)  Vitamin D3 1000 Unit Tabs (Cholecalciferol) .[name].. 1 qd 7)  Baby Aspirin 81 Mg Chew (Aspirin) .... Once daily 8)  Klor-con M10 10 Meq Cr-tabs (Potassium chloride crys cr) .[name].. 1 by mouth qd  Patient Instructions: 1)  Please schedule a follow-up appointment in 4 months well w/labs and A1c vit B12 v70.0  266.20  250.00. Prescriptions: AMARYL 4 MG  TABS (GLIMEPIRIDE) two times a day  #180 x 3   Entered and Authorized by:   Tresa Garter MD   Signed by:   Tresa Garter MD on 11/28/2009   Method used:   Faxed to ...       right source (mail-order)             , [place]         Ph:        Fax: [phone]   RxID:   [id number]

## 2010-07-07 NOTE — Medication Information (Signed)
Summary: [facility]   Imported By: Sherian Rein 04/27/2010 10:44:36  _____________________________________________________________________  External Attachment:    Type:   Image     Comment:   External Document

## 2010-07-07 NOTE — Letter (Signed)
Summary: Patient Notice- Colon Biospy Results  [facility]  [address] [place], [place] [address]   Phone: [phone]  Fax: [phone]        June 17, 2009 MRN: [id number]    [facility] [address] [place], [place]  [address]    Dear Holly Banks,  I am pleased to inform you that the biopsies taken during your recent colonoscopy did not show any evidence of cancer upon pathologic examination.The polyp was adenomatous ( precanceous).  Additional information/recommendations:  x__No further action is needed at this time.  Please follow-up with      your primary care physician for your other healthcare needs.  __Please call [phone] to schedule a return visit to review      your condition.  __Continue with the treatment plan as outlined on the day of your      exam.  _x_You should have a repeat colonoscopy examination for this problem           in _5 years.  Please call us if you are having persistent problems or have questions about your condition that have not been fully answered at this time.  Sincerely,  Hart Carwin MD   This letter has been electronically signed by your physician.  Appended Document: Patient Notice- Colon Biospy Results Letter mailed 01.12.11

## 2010-07-07 NOTE — Assessment & Plan Note (Signed)
Summary: 2 MO ROV /NWS  #   Vital Signs:  Patient profile:   77 year old female Weight:      250 pounds Temp:     98.2 degrees F oral Pulse rate:   77 / minute BP sitting:   166 / 86  (left arm)  Vitals Entered By: Tora Perches (August 25, 2009 10:45 AM) CC: f/u Is Patient Diabetic? Yes   Primary Care Provider:  Sonda Primes, MD  CC:  f/u.  History of Present Illness: The patient presents for a follow up of hypertension, OA w/stiffness - no pain, hyperlipidemia   Preventive Screening-Counseling & Management  Alcohol-Tobacco     Smoking Status: quit  Current Medications (verified): 1)  Amaryl 4 Mg  Tabs (Glimepiride) .... Two Times A Day 2)  Verapamil Hcl Cr 360 Mg  Cp24 (Verapamil Hcl) .... Once Daily 3)  Toprol Xl 100 Mg Tb24 (Metoprolol Succinate) .... Take 1 Tablet By Mouth Once A Day 4)  Allopurinol 300 Mg Tabs (Allopurinol) .... Take 1 Tab Every Day 5)  Vitamin B-12 1000 Mcg  Tabs (Cyanocobalamin) .[name].. 1 Qd 6)  Vitamin D3 1000 Unit  Tabs (Cholecalciferol) .[name].. 1 Qd 7)  Baby Aspirin 81 Mg  Chew (Aspirin) .... Once Daily  Allergies: 1)  ! 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  268.9 hyperkalemia Gastritis with hx of H. Pylori Diverticulosis  Past Surgical History: Last updated: 12/25/2008 right tkr thr left  3/4 colonoscopy  02 Total hip replacement R 2009  Cholecystectomy Hip Replacement(L) Lithotripsy  Social History: Last updated: 12/25/2008 Retired Single Alcohol Use - no Illicit Drug Use - no Patient is a former smoker. -stopped over 50 years ago Patient does not get regular exercise.   Review of Systems       The patient complains of dyspnea on exertion and difficulty walking.  The patient denies fever, chest pain,  melena, and depression.    Physical Exam  General:  Well developed, no acute distress. obese.   Nose:  External nasal examination shows no deformity or inflammation. Nasal mucosa are pink and moist without lesions or exudates. Mouth:  Oral mucosa and oropharynx without lesions or exudates.  Teeth in good repair. Lungs:  Clear throughout to auscultation. Heart:  Regular rate and rhythm; no murmurs, rubs,  or bruits. Abdomen:  Soft, nontender and nondistended. No masses, hepatosplenomegaly or hernias noted. Normal bowel sounds. Msk:  B knees w/OA deformities, using a cane Neck, L shouder NT w/ROM Extremities:  trace left pedal edema and trace right pedal edema.   Neurologic:  Alert and  oriented x4;  grossly normal neurologically; L grip (?) 5-/5 Skin:  Clear Psych:  Alert and cooperative. Normal mood and affect.   Impression & Recommendations:  Problem # 1:  OSTEOARTHRITIS (ICD-715.90) w/stiffness - no pain Assessment Unchanged  The following medications were removed from the medication list:    Tramadol Hcl 50 Mg Tabs (Tramadol hcl) .[name]... 1-2 tabs by mouth two times a day as needed pain Her updated medication list for this problem includes:    Baby Aspirin 81 Mg Chew (Aspirin) ..... Once daily  Problem # 2:  B12 DEFICIENCY (ICD-266.2) Assessment: Improved On prescription therapy   Problem # 3:  EDEMA (ICD-782.3) Assessment: Unchanged  Problem # 4:  HYPERTENSION (ICD-401.9) Assessment: Unchanged Loose  wt Her updated medication list for this problem includes:    Verapamil Hcl Cr 360 Mg Cp24 (Verapamil hcl) ..... Once daily    Toprol Xl 100 Mg Tb24 (Metoprolol succinate) .[name]... Take 1 tablet by mouth once a day  BP today: 166/86 Prior BP: 145/76 (06/16/2009)  Prior 10 Yr Risk Heart Disease: Not enough information (10/16/2008)  Labs Reviewed: K+: 3.3 (08/21/2009) Creat: : 0.7 (08/21/2009)   Chol: 194 (08/21/2009)   HDL: 66.70 (08/21/2009)   LDL: 100 (08/21/2009)   TG:  136.0 (08/21/2009)  Problem # 5:  DIABETES MELLITUS, TYPE II (ICD-250.00) Assessment: Unchanged  Her updated medication list for this problem includes:    Amaryl 4 Mg Tabs (Glimepiride) .[name]..[name] Two times a day    Baby Aspirin 81 Mg Chew (Aspirin) ..... Once daily  Complete Medication List: 1)  Amaryl 4 Mg Tabs (Glimepiride) .... Two times a day 2)  Verapamil Hcl Cr 360 Mg Cp24 (Verapamil hcl) .... Once daily 3)  Toprol Xl 100 Mg Tb24 (Metoprolol succinate) .... Take 1 tablet by mouth once a day 4)  Allopurinol 300 Mg Tabs (Allopurinol) .... Take 1 tab every day 5)  Vitamin B-12 1000 Mcg Tabs (Cyanocobalamin) .[name].. 1 qd 6)  Vitamin D3 1000 Unit Tabs (Cholecalciferol) .[name].. 1 qd 7)  Baby Aspirin 81 Mg Chew (Aspirin) .... Once daily 8)  Klor-con M10 10 Meq Cr-tabs (Potassium chloride crys cr) .[name].. 1 by mouth qd  Patient Instructions: 1)  Try Advil 2 tabs two times a day x 1 wk  2)  Please schedule a follow-up appointment in 3 months. 3)  BMP prior to visit, ICD-9: 4)  TSH prior to visit, ICD-9:995.20 250.00 5)  HbgA1C prior to visit, ICD-9: Prescriptions: KLOR-CON M10 10 MEQ CR-TABS (POTASSIUM CHLORIDE CRYS CR) 1 by mouth qd  #30 x 3   Entered and Authorized by:   Tresa Garter MD   Signed by:   Tresa Garter MD on 08/25/2009   Method used:   Electronically to        [facility] [address] #[id number]* (retail)       [address] [place], [place]  [address]       Ph: [phone]       Fax: [phone]   RxID:   [id number] VERAPAMIL HCL CR 360 MG  CP24 (VERAPAMIL HCL) once daily  #30.0 Each x 12   Entered and Authorized by:   Tresa Garter MD   Signed by:   Tresa Garter MD on 08/25/2009   Method used:   Electronically to        [id number]* (retail)       [address] [place], [place]  [address]       Ph: [phone]       Fax: [phone]   RxID:   [id number]

## 2010-07-07 NOTE — Letter (Signed)
Summary: [facility]  [facility]   Imported By: Lennie Odor 07/16/2009 14:27:45  _____________________________________________________________________  External Attachment:    Type:   Image     Comment:   External Document

## 2010-07-10 NOTE — Medication Information (Signed)
[facility]   Imported By: Lester Iva 04/28/2010 11:05:38  _____________________________________________________________________  External Attachment:    Type:   Image     Comment:   External Document

## 2010-07-15 NOTE — Assessment & Plan Note (Signed)
Summary: 3 mo rov /nws  #   Vital Signs:  Patient profile:   77 year old female Height:      62.5 inches Weight:      252 pounds BMI:     45.52 Temp:     98.9 degrees F oral Pulse rate:   72 / minute Pulse rhythm:   regular Resp:     16 per minute BP sitting:   140 / 80  (left arm) Cuff size:   large  Vitals Entered By: Lanier Prude, CMA(AAMA) (July 07, 2010 11:06 AM) CC: 3 mo f/u  Is Patient Diabetic? Yes   Primary Care Provider:  Sonda Primes, MD  CC:  3 mo f/u .  History of Present Illness: The patient presents for a follow up of hypertension, diabetes, hyperlipidemia and B12 def  Current Medications (verified): 1)  Metformin Hcl 500 Mg Tabs (Metformin Hcl) .[name].. 1 By Mouth Two Times A Day For Diabetes 2)  Amaryl 4 Mg  Tabs (Glimepiride) .[name].. 1 Tab Two Times A Day 3)  Verapamil Hcl Cr 360 Mg  Cp24 (Verapamil Hcl) .... Once Daily 4)  Toprol Xl 100 Mg Tb24 (Metoprolol Succinate) .... Take 1 Tablet By Mouth Once A Day 5)  Allopurinol 300 Mg Tabs (Allopurinol) .... Take 1 Tab Every Day 6)  Vitamin B-12 1000 Mcg  Tabs (Cyanocobalamin) .[name].. 1 Qd 7)  Vitamin D3 1000 Unit  Tabs (Cholecalciferol) .[name].. 1 Qd 8)  Baby Aspirin 81 Mg  Chew (Aspirin) .... Once Daily 9)  Klor-Con M10 10 Meq Cr-Tabs (Potassium Chloride Crys Cr) .[name].. 1 By Mouth Qd  Allergies (verified): 1)  ! 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  268.9 hyperkalemia Gastritis with hx of H. Pylori Diverticulosis  Social History: Last updated: 12/25/2008 Retired Single Alcohol Use - no Illicit Drug Use - no Patient is a former smoker. -stopped over 50 years ago Patient does not get regular exercise.   Review of Systems  The patient denies weight loss, weight gain, chest pain,  dyspnea on exertion, abdominal pain, and hematochezia.    Physical Exam  General:  Well developed, no acute distress. obese.   Nose:  External nasal examination shows no deformity or inflammation. Nasal mucosa are pink and moist without lesions or exudates. Mouth:  Oral mucosa and oropharynx without lesions or exudates.  Teeth in good repair. Lungs:  Clear throughout to auscultation. Heart:  Regular rate and rhythm; no murmurs, rubs,  or bruits. Abdomen:  Soft, nontender and nondistended. No masses, hepatosplenomegaly or hernias noted. Normal bowel sounds. Msk:  B knees w/OA deformities, using a cane Neck, L shouder NT w/ROM Extremities:  trace left pedal edema and trace right pedal edema.   Neurologic:  Alert and  oriented x4;  grossly normal neurologically; L grip (?) 5-/5 Skin:  Clear Psych:  Alert and cooperative. Normal mood and affect.   Impression & Recommendations:  Problem # 1:  OSTEOARTHRITIS (ICD-715.90) Assessment Unchanged  Her updated medication list for this problem includes:    Baby Aspirin 81 Mg Chew (Aspirin) ..... Once daily  Problem # 2:  B12 DEFICIENCY (ICD-266.2) Assessment: Improved On the regimen of medicine(s) reflected in the chart    Problem # 3:  HYPERTENSION (ICD-401.9) Assessment: Unchanged  Her updated medication list for this problem includes:  Verapamil Hcl Cr 360 Mg Cp24 (Verapamil hcl) ..... Once daily    Toprol Xl 100 Mg Tb24 (Metoprolol succinate) .[name]... Take 1 tablet by mouth once a day    Cozaar 50 Mg Tabs (Losartan potassium) .[name]... 1 by mouth qd  BP today: 140/80 Prior BP: 140/84 (04/03/2010)  Prior 10 Yr Risk Heart Disease: Not enough information (10/16/2008)  Labs Reviewed: K+: 3.9 (06/30/2010) Creat: : 0.6 (06/30/2010)   Chol: 209 (03/31/2010)   HDL: 58.70 (03/31/2010)   LDL: 100 (08/21/2009)   TG: 132.0 (03/31/2010)  Problem # 4:  DIABETES MELLITUS, TYPE II (ICD-250.00) Assessment: Improved  Her updated medication list for  this problem includes:    Metformin Hcl 500 Mg Tabs (Metformin hcl) .[name]... 1 by mouth two times a day for diabetes    Amaryl 4 Mg Tabs (Glimepiride) .[name]... 1 tab two times a day    Baby Aspirin 81 Mg Chew (Aspirin) ..... Once daily    Cozaar 50 Mg Tabs (Losartan potassium) .[name]... 1 by mouth qd  Labs Reviewed: Creat: 0.6 (06/30/2010)    Reviewed HgBA1c results: 6.6 (06/30/2010)  7.7 (03/31/2010)  Complete Medication List: 1)  Metformin Hcl 500 Mg Tabs (Metformin hcl) .[name].. 1 by mouth two times a day for diabetes 2)  Amaryl 4 Mg Tabs (Glimepiride) .[name].. 1 tab two times a day 3)  Verapamil Hcl Cr 360 Mg Cp24 (Verapamil hcl) .... Once daily 4)  Toprol Xl 100 Mg Tb24 (Metoprolol succinate) .... Take 1 tablet by mouth once a day 5)  Allopurinol 300 Mg Tabs (Allopurinol) .... Take 1 tab every day 6)  Vitamin B-12 1000 Mcg Tabs (Cyanocobalamin) .[name].. 1 qd 7)  Vitamin D3 1000 Unit Tabs (Cholecalciferol) .[name].. 1 qd 8)  Baby Aspirin 81 Mg Chew (Aspirin) .... Once daily 9)  Klor-con M10 10 Meq Cr-tabs (Potassium chloride crys cr) .[name].. 1 by mouth qd 10)  Cozaar 50 Mg Tabs (Losartan potassium) .[name].. 1 by mouth qd  Patient Instructions: 1)  Please schedule a follow-up appointment in 4 months. 2)  BMP prior to visit, ICD-9: 3)  HbgA1C prior to visit, ICD-9:250.00 4)  See your eye doctor please Prescriptions: COZAAR 50 MG TABS (LOSARTAN POTASSIUM) 1 by mouth qd  #30 x 11   Entered and Authorized by:   Tresa Garter MD   Signed by:   Tresa Garter MD on 07/07/2010   Method used:   Print then Give to Patient   RxID:   [id number]    Orders Added: 1)  Est. Patient Level IV [[address]]

## 2010-08-23 LAB — GLUCOSE, CAPILLARY

## 2010-08-26 LAB — CBC
HCT: 39.8 % (ref 36.0–46.0)
Hemoglobin: 12.7 g/dL (ref 12.0–15.0)
MCHC: 32 g/dL (ref 30.0–36.0)
MCV: 88.7 fL (ref 78.0–100.0)
RBC: 4.49 MIL/uL (ref 3.87–5.11)
WBC: 6.2 10*3/uL (ref 4.0–10.5)

## 2010-08-26 LAB — BASIC METABOLIC PANEL
CO2: 27 mEq/L (ref 19–32)
Chloride: 106 mEq/L (ref 96–112)
Creatinine, Ser: 0.61 mg/dL (ref 0.4–1.2)
GFR calc Af Amer: >60 mL/min (ref >60)
Potassium: 3.6 mEq/L (ref 3.5–5.1)
Sodium: 143 mEq/L (ref 135–145)

## 2010-08-26 LAB — DIFFERENTIAL
Basophils Absolute: 0 10*3/uL (ref 0.0–0.1)
Neutrophils Relative %: 62 % (ref 43–77)

## 2010-08-26 LAB — GLUCOSE, CAPILLARY: Glucose-Capillary: 190 mg/dL — ABNORMAL HIGH (ref 70–99)

## 2010-09-07 LAB — POCT I-STAT, CHEM 8
Calcium, Ion: 1.21 mmol/L (ref 1.12–1.32)
Creatinine, Ser: 0.6 mg/dL (ref 0.4–1.2)
Hemoglobin: 13.6 g/dL (ref 12.0–15.0)
Sodium: 140 mEq/L (ref 135–145)
TCO2: 30 mmol/L (ref 0–100)

## 2010-09-07 LAB — CBC
HCT: 38 % (ref 36.0–46.0)
MCV: 87.8 fL (ref 78.0–100.0)
Platelets: 167 10*3/uL (ref 150–400)
RDW: 13.9 % (ref 11.5–15.5)
WBC: 5.9 10*3/uL (ref 4.0–10.5)

## 2010-09-07 LAB — DIFFERENTIAL
Eosinophils Absolute: 0.1 10*3/uL (ref 0.0–0.7)
Eosinophils Relative: 2 % (ref 0–5)
Lymphs Abs: 1.6 10*3/uL (ref 0.7–4.0)

## 2010-09-15 LAB — GLUCOSE, CAPILLARY: Glucose-Capillary: 160 mg/dL — ABNORMAL HIGH (ref 70–99)

## 2010-09-22 ENCOUNTER — Encounter: Payer: Self-pay | Admitting: Internal Medicine

## 2010-10-03 ENCOUNTER — Other Ambulatory Visit: Payer: Self-pay | Admitting: *Deleted

## 2010-10-03 MED ORDER — LOSARTAN POTASSIUM 50 MG PO TABS: 50.0000 mg | ORAL_TABLET | Freq: Every day | ORAL | Status: DC

## 2010-10-03 MED ORDER — METFORMIN HCL 500 MG PO TABS: 500.0000 mg | ORAL_TABLET | Freq: Two times a day (BID) | ORAL | Status: DC

## 2010-10-03 MED ORDER — POTASSIUM CHLORIDE 10 MEQ PO TBCR: 10.0000 meq | EXTENDED_RELEASE_TABLET | Freq: Every day | ORAL | Status: DC

## 2010-10-20 NOTE — Discharge Summary (Signed)
NAMECUMI, SANAGUSTIN NO.:  [id number]   MEDICAL RECORD NO.:  [id number]          PATIENT TYPE:  INP   LOCATION:  5035                         FACILITY:  [facility]   PHYSICIAN:  Lindwood Qua, P.A. DATE OF BIRTH:  September 04, 1928   DATE OF ADMISSION:  10/19/2007  DATE OF DISCHARGE:  10/24/2007                               DISCHARGE SUMMARY   ADMISSION DIAGNOSES:  1. Right hip end-stage degenerative joint disease.  2. Diabetes mellitus.  3. Hypertension.  4. History of other joint replacements   DISCHARGE DIAGNOSIS:  1. Right hip end-stage degenerative joint disease.  2. Diabetes mellitus.  3. Hypertension.  4. History of other joint replacements   OPERATION:  Right total hip replacement.   BRIEF HISTORY:  Ms. Kniskern is a 77 year old black female patient well-  known to our practice who has had other joint replacements and has done  well.  Her right hip continues to bother her, she is having pain with  every step and x-rays reveal end-stage bone-on-bone DJD.  We discussed  treatment options with her that being total hip replacement.  Risks of  anesthesia, infection, DVT, and possible death.   PERTINENT LABORATORY AND X-RAY FINDINGS:  Her hemoglobin was 9.5,  hematocrit 28.9, platelets 152.  WBCs 8.9.  Chem-7; sodium 139,  potassium 3.6, BUN 11, creatinine 0.68, glucose 145 and this was checked  regularly as she was a diabetic.  INR therapeutic between 2 and 3 range,  regulated by pharmacy.   X-ray:  No active chest process on x-ray.   HOSPITAL COURSE:  She was admitted postoperatively, placed on antibiotic  care and analgesics for pain, kept on her home medications which will be  outlined at the end of this dictation.  IV Ancef a gram q.8 h. x3 doses,  PCA pump for pain, and then pharmacy for regulation of Coumadin and  Lovenox for DVT prophylaxis, also had incentive spirometry, knee-high  TEDs and then physical therapy out of bed, weightbearing as  tolerated  with appropriate oral pain medicines, antiemetics, iron supplement,  laxatives, Foley catheter etc.  On the first day postop, she was  comfortable.  Wound was benign.  No sign of infection or irritation.  Lungs were clear.  Abdomen was soft.  Foley catheter was discontinued.  The second day postop, she was eating and voiding well, but was working  slow with physical therapy.  Weightbearing as tolerated.  Her dressing  was changed.  Her wound was noted to be benign.  No sign of infection or  irritation.  Good neurovascular status to her legs.  We discussed  placement with the patient and family of that being if she continued to  go slow on physical therapy, that the skilled nursing facility placement  may be to her advantage versus being discharged home.   CONDITION ON DISCHARGE:  Improved.   FOLLOW UP:  She will remain on Percocet 1 or 2 q.4-6 h. p.r.n. pain as  needed, Coumadin dose regulated by pharmacy, a low-sodium heart-healthy  diet, may change the dressing daily, weightbearing as tolerated.  Any  sign of infection, she is  to call our office [phone] and report this  and then be seen.  If she goes home, she will have home physical therapy  and blood draw INRs and if she does not, these will be done in the  skilled nursing facility.   HOME MEDICATIONS:  1. Amaryl 4 mg 2 pills a day.  2. Verapamil 360 once a day.  3. VESIcare 5 mg once daily.  4. Toprol XL 100 mg one daily.  5. Colchicine 0.6 by mouth q. 6 p.r.n. gout attack.  6. Allopurinol 300 mg one daily.  7. Vitamin B 12,000 mcg one daily.  8. Vitamin D 3,000 units one daily.      Lindwood Qua, P.A.     MC/MEDQ  D:  10/23/2007  T:  10/23/2007  Job:  [id number]

## 2010-10-20 NOTE — Op Note (Signed)
NAMEMURNA, BACKER NO.:  [id number]   MEDICAL RECORD NO.:  [id number]          PATIENT TYPE:  INP   LOCATION:  5035                         FACILITY:  [facility]   PHYSICIAN:  Lubertha Basque. Dalldorf, M.D.DATE OF BIRTH:  [birth year]   DATE OF PROCEDURE:  10/19/2007  DATE OF DISCHARGE:                               OPERATIVE REPORT   PREOPERATIVE DIAGNOSIS:  Right hip degenerative arthritis.   POSTOPERATIVE DIAGNOSIS:  Right hip degenerative arthritis.   PROCEDURE:  Right total hip replacement.   ANESTHESIA:  General.   ATTENDING SURGEON:  Lubertha Basque. Jerl Santos, MD   ASSISTANT:  Lindwood Qua, PA   INDICATIONS FOR PROCEDURE:  The patient is a 77 year old woman with a  long history of right hip pain.  This has persisted despite oral anti-  inflammatories and walking appliances.  At this point, she cannot even  sit comfortably.  Walking and resting have become very difficult.  She  is status post successful hip replacement on the opposite side about 10  years back and is offered the same procedure on this side.  Informed  operative consent was obtained after discussion of possible  complications of reaction to anesthesia, infection, DVT, PE,  dislocation, and death.   SUMMARY/FINDINGS AND PROCEDURE:  Under general anesthesia and a  posterior approach a right total hip replacement was performed.  She had  advanced degenerative change and good bone quality.  We addressed her  problem with a cemented DePuy ASR system.  We placed a size 4 high  offset basic cemented Summit stem in appropriate anteversion.  At the  acetabulum, we used a size 52 ASR cup.  In between, we placed a +2 ASR  46-mm head.  Lindwood Qua assisted throughout and was invaluable to  the completion of the case in that he helped position and retract while  I performed the procedure.  He also closed simultaneously to help  minimize OR time.   DESCRIPTION OF PROCEDURE:  The patient was taken to the  operating suite  where general anesthetic was applied without difficulty.  She was  positioned in lateral decubitus position with the right hip up.  Hip  positioners were utilized and all bony prominences were appropriately  padded.  An axillary roll was placed.  She was prepped and draped in  normal sterile fashion.  After the administration, preop IV Kefzol and  posterior approach was taken to the right hip.  A skin incision was made  with dissection down through an abundance of adipose tissue to expose  the IT band and gluteus maximus fascia.  These structures were split  longitudinally to expose the short external rotators to the hip which  were tagged and reflected.  Hip was dislocated.  Femoral neck cut was  made above the lesser trochanter and the head was removed.  The  acetabulum was fully exposed.  Some osteophytes were removed along with  the labrum.  I took reaming towards the inner wall of the pelvis.  Then  sequentially up reamed to a 51 followed by placement of size 52 ASR cup  in appropriate anteversion and  tilt.  The attention was then turned  toward the femur.  This was broached and reamed up to a size 4.  Trial  reduction was done and leg lengths were felt to be equal, good  stability, extension with external rotation and flexion with internal  rotation.  The trial component was removed followed by placement of a  distal size 3 cement spacer plug.  The canal was thoroughly irrigated.  Cement was mixed including Zinacef antibiotic was pressurized into the  femoral stem.  We then placed the size 4 high offset Basic cemented stem  in appropriate anteversion.  Excess cement was trimmed, the pressure was  held on the component until the cement had hardened completely.  The +2,  size 46 ASR head neck assembly was then applied to a dry Morse taper  neck.  Hip was reduced and again was stable in the aforementioned  positions.  The wound was irrigated followed by reapproximation  and  short external rotators to the greater trochanteric region with  nonabsorbable suture.  IT band was reapproximated with #1 Ethibond in an  interrupted fashion followed by subcutaneous reapproximation and three  layers using Vicryl.  The skin was closed with staples.  Adaptic was  applied followed by dry gauze into this Ace wrap.   ESTIMATED BLOOD LOSS:  200 mL.   INTRAOPERATIVE FLUIDS:  Obtained from anesthesia records.   DISPOSITION:  The patient was extubated in the operating room and taken  to recovery room in stable condition.  She was readmitted to the  orthopedic surgery service for appropriate postop care to include  perioperative antibiotics and Coumadin plus Lovenox for DVT prophylaxis.      Lubertha Basque Jerl Santos, M.D.  Electronically Signed     PGD/MEDQ  D:  10/19/2007  T:  10/20/2007  Job:  [id number]

## 2010-10-23 NOTE — Consult Note (Signed)
NAMEDASIA, GUERRIER NO.:  [id number]   MEDICAL RECORD NO.:  [id number]          PATIENT TYPE:  INP   LOCATION:  3730                         FACILITY:  [facility]   PHYSICIAN:  Aram Beecham B. Eliott Nine, M.D.DATE OF BIRTH:  July 19, 1928   DATE OF CONSULTATION:  06/21/2005  DATE OF DISCHARGE:                                   CONSULTATION   Please note that this patient has two medical record numbers; the current  one in the emergency room right now is [id number] but all of her other old  records are listed under [id number] and this needs to be documented.   We are asked to see this patient because of hyperkalemia. She is a 77-year-  old black female, patient of Dr. Posey Rea, who has a history of diabetes,  hypertension and hip and knee replacements in the past. She came to the  emergency room today after at least a week of not feeling right, queasy  stomach, weakness and difficulty walking. She states she could not make her  legs go. She also had some drenching night sweats for a couple of nights in  a row associated with low blood sugars. She was brought to the emergency  room today and was found to have a blood pressure of 149/69 and initial  heart rate of 51 with a sinus bradycardia on electrocardiogram. A potassium  of 7.5 and a serum creatinine of 2.3. Her medication list included  spironolactone 100 milligrams a day, triamterene/HCTZ 75/50 once a day and  Klor-Con 40 mEq a day.   In the emergency room, she has received calcium chloride x1, sodium  bicarbonate x2, Kayexalate 60 grams, 10 units of regular insulin, 1 ampule  of d50 and a liter of normal saline preceded by 80 milligrams of Lasix.  Follow-up potassium when repeated was still 7.5. She is now, however,  actively stooling following the Kayexalate and her cardiac rhythm has  changed from a sinus bradycardia to a sinus tachycardia. She put out 1600 cc  of urine after her first dose of Lasix and has just received  another 80  milligrams of Lasix with IV fluids.   She is not aware of any past problems with either chronic kidney disease or  hyperkalemia. Her baseline creatinine when she was here in September 2006  and discharged from the rehab service on November 2006 was 0.9.   She states that she has been on her current medications since her total knee  replacement. The bottles that she brought with her to the emergency room  include Amaryl 4 milligrams b.i.d., Naprosyn 500 milligrams b.i.d.,  metoprolol with two separate bottles and a total dose of 225 milligrams  b.i.d., Klor-Con 40 mEq a day, triamterene/HCTZ 75/50 once a day,  spironolactone 100 milligrams a day and hydrocodone/APAP 10/660 1/2 to 1  tablet q.i.d. p.r.n. Her discharge medications from September 2006 indicated  a dose of metoprolol of 200 milligrams a day, Aldactone 100 milligrams a  day, triamterene/HCTZ 75/50 a day, Klor-Con 10 mEq a day rather than 40 and  also included Benicar 40 milligrams a day which she does  not have in her  bag.   PAST MEDICAL HISTORY:  1.  Type 2 diabetes.  2.  Hypertension.  3.  History of right total knee replacement.  4.  History of left total hip replacement.  5.  Osteoarthritis.  6.  Hyperlipidemia.  7.  History of gout.  8.  History of chest pain following her total knee replacement in September      2006 with negative enzymes. Preoperative Cardiolite was negative and      ejection fraction was around 56%.  9.  Penicillin allergy.  10. GERD.   FAMILY HISTORY:  Positive for diabetes.   SOCIAL HISTORY:  She is a widow. Does not smoke or drink and has six  children who are all grown.   REVIEW OF SYSTEMS:  Positive for weakness, queasy stomach, night sweats for  a couple of days. No chest pain, shortness of breath, nausea, vomiting or  abdominal pain. She is stooling now after Kayexalate. She has been having  difficulty walking and describes problems with balance and leg weakness.    PHYSICAL EXAM:  She is a very nice obese black female in no acute distress.  Blood pressure 120/70, heart rate 96, sinus tachycardia on telemetry. No  JVD. Lungs were clear. Cardiac exam S1-S2. No S3. No murmur. Abdomen is  soft. Bowel sounds were present. She is nontender. She has scars over the  left hip and the right knee. There is no edema of the lower extremities.   LABORATORY:  Hemoglobin 11.9, WBC 5600, serum creatinine 2.3 and all  potassium levels are 7.5 or greater.   IMPRESSION:  1.  Hyperkalemia in the setting of two potassium-sparing diuretics, high      doses of potassium replacement and beta blockers at doses which would      cause to renin inhibition. In addition, her discharge medications from      her rehab admission back in November also included an ARB but that is      not in her bag today, so I do not know if she has been taking it or not.      Her electrocardiogram has improved as has her cardiac rhythm, although      her potassium is still high, however, she is now stooling secondary      Kayexalate and her urine output is excellent after Lasix; so I am      optimistic that we can manage this conservatively by allowing the      Kayexalate to take effect and increasing urine flow rates with Lasix.      Would recommend increasing IV fluids to 150 cc an hour, redose Lasix 80      milligrams every 12 hours for the next 24 hours, check labs q.6h. Stop      all potassium or potassium-sparing drugs and provide dialysis only if      potassium does not start to come down over the next 3-6 hours. Would not      use any ACE or ARB therapy for hypertension.  2.  Acute renal failure:  Serum creatinine was 0.9 in September 2006 and is      2.3 now. Question whether this is related to decreased perfusion from      decreased cardiac output related to hyperkalemia. We need to look at a     renal ultrasound to rule out alternative etiologies. Did she develop      acute renal failure  and then  hyperkalemia  in the setting of multiple      potassium-sparing medicines and potassium supplements?  3.  Hypertension.  4.  Diabetes per primary service. Odd medication discrepancies. It will be      very important to clarify what her medication dosage should be as her      medicines do not match discharge medications from her last admission and      she states that there has been no actual change in her dosing.           ______________________________  Duke Salvia. Eliott Nine, M.D.     CBD/MEDQ  D:  06/21/2005  T:  06/21/2005  Job:  [id number]

## 2010-10-23 NOTE — Discharge Summary (Signed)
Holly Banks, Holly Banks NO.:  [id number]   MEDICAL RECORD NO.:  [id number]          PATIENT TYPE:  INP   LOCATION:  3730                         FACILITY:  [facility]   PHYSICIAN:  Rene Paci, M.D. LHCDATE OF BIRTH:  1929/04/26   DATE OF ADMISSION:  06/21/2005  DATE OF DISCHARGE:  06/24/2005                                 DISCHARGE SUMMARY   DISCHARGE DIAGNOSES:  1.  Severe hyperkalemia.  2.  Acute renal failure.   HISTORY OF PRESENT ILLNESS:  The patient is a 77 year old African-American  female admitted on June 21, 2005 who presented to the emergency room with  complaints of nausea and weakness which started approximately 1 week prior  to admission. She reported that these symptoms have become progressively  worse to the point that it made it impossible for her to walk at all on the  morning of admission. She was admitted for further workup and evaluation.   PAST MEDICAL HISTORY:  1.  Diabetes type 2.  2.  Hypertension.  3.  Osteoarthritis.  4.  GERD.  5.  Blood loss anemia.  6.  Kidney stone.   COURSE OF HOSPITALIZATION:  Problem #1: Severe hyperkalemia. The patient was  admitted and was given Kayexalate for a potassium level greater than 7.5.  A  renal consult was obtained and the patient was seen by Dr. Eliott Banks. It was  recommended that the patient avoid ARBs, ACE inhibitors, as well as  potassium and diuretics. On admission, the patient was also noted to be in  acute renal failure with a creatinine of 2.3 and a baseline creatinine of  0.9. The patient received calcium chloride, sodium bicarbonate and  Kayexalate as well as an amp of D50 in 1 L. The patient's potassium trended  downward and at the time of discharge is 4.2. Her creatinine has also  normalized at 1.1.   Problem #2: Hypertension. The patient's blood pressure remained stable. She  is maintained off of ACE inhibitors and ARBs. At the time of this dictation,  the patient's diuretics  are also being held including Lasix, will defer  resuming Lasix to the patient's primary MD.   Problem #3: Debilitation. The patient's strength improved during this  hospitalization with the normalization of her potassium. A PT consult was  obtained and they recommended home health PT as well as the rolling walker  which the patient already has at home. She was instructed to use her rolling  walker.   MEDICATIONS AT DISCHARGE:  1.  Lopressor 25 mg p.o. b.i.d.  2.  Glipizide 4 mg p.o. daily.  3.  Vicodin 1 tab every 6 hours as needed.  4.  Naproxen 500 mg p.o. b.i.d. as needed.  5.  The patient was instructed to hold NSAIDs as well for now.  6.  In addition, the patient instructed not to take K-Dur, spironolactone,      triamterine/hydrochlorothiazide, or Benicar. She was also instructed to      hold her Lasix until she follows up with Dr. Posey Banks.   LABORATORIES AT DISCHARGE:  Potassium 4.2, BUN 37, creatinine 1.1,  hemoglobin 11.9,  hematocrit 37.   FOLLOW-UP:  The patient was instructed to follow-up with Dr. Solon Augusta Banks  on Wednesday, January 24 at 3:45 p.m.   cc: primary care at [facility]. Peggyann Juba, NP      Rene Paci, M.D. [facility]  Electronically Signed    MSO/MEDQ  D:  06/24/2005  T:  06/24/2005  Job:  [id number]   cc:   Georgina Quint. Banks, M.D. [facility]  [address]  [place]  [place] [address]

## 2010-10-23 NOTE — Assessment & Plan Note (Signed)
[facility]                           PRIMARY CARE OFFICE NOTE   Holly Banks, Holly Banks                     MRN:          [id number]  DATE:10/12/2006                            DOB:          26-Apr-1929    REFERRING PHYSICIAN:  Lubertha Basque. Jerl Santos, M.D.   REASON FOR CONSULTATION:  Preoperative clearance for right hip surgery.   HISTORY:  The patient is a 77 year old female with advanced  osteoarthritis and end-stage osteoarthritis and multiple other medical  problems, who presents for preoperative clearance.  She is very tired of  having a lot of discomfort in the right hip, being not able to walk.  The right hip seems to be going out of place all the time, cracks and  snaps.   PAST MEDICAL HISTORY:  1. Type 2 diabetes.  2. Morbid obesity.  3. Osteoarthritis.  4. Gastroesophageal reflux disease.  5. Gout.  6. History of hyperkalemia.  7. Vitamin D deficiency.  8. History of colon polyps.   PAST SURGICAL HISTORY:  1. Right total knee replacement.  2. Left total knee replacement.  3. Left total hip replacement.   CURRENT MEDICATIONS:  1. Verapamil XR 180 mg daily.  2. Allopurinol 100 mg daily.  3. Amaryl 4 mg b.i.d.  4. Lasix 80 mg daily.  5. Baby aspirin daily.  6. Vicodin 5/500 mg p.r.n.   ALLERGIES:  PENICILLIN.   SOCIAL HISTORY:  She has a very supportive family.  Does not smoke or  drink.   FAMILY HISTORY:  Positive for coronary artery disease and hypertension.   REVIEW OF SYSTEMS:  Very limited ambulation.  Able to walk with a walker  only now.  No chest pain, no shortness of breath, no syncope.  Unable to  lose weight.  Denies sleep apnea.  Denies leg swelling.  The rest of the  18-point review of systems is negative.   PHYSICAL EXAMINATION:  VITAL SIGNS:  Blood pressure 170/78, pulse 59,  temperature 97.7 degrees, weight 249 pounds.  GENERAL:  She is in no acute distress.  HEENT:  Moist nasal mucosa.  NECK:  Supple, no  thyromegaly or bruit.  LUNGS:  Clear, no wheezes or rales.  HEART:  S1 and S2.  No gallops.  Distant sounds.  A grade 2/6 systolic  murmur.  ABDOMEN:  Obese, soft, nontender.  EXTREMITIES:  Lower extremities with trace of edema.  Calves nontender.  Right hip pain to range of motion.  All joints with chronic arthritic  changes.  NEUROLOGIC:  Alert, oriented and cooperative.  Denies being depressed.   LABORATORY DATA:  No new labs are available.   ASSESSMENT/PLAN:  1. Preoperative clearance:  I would like to obtain a Cardiolite stress      test, correct vitamin D deficiency, obtain laboratory work.  With      all of the above being normal/corrected, she should be clear for      surgery.  I would recommend a telemetry bed following the surgery      for a day or two.  2. Vitamin D deficiency:  She never took Rocaltrol that  I gave her.  I      gave her vitamin D-2 , 50,000 units weekly for six weeks, followed      by vitamin D-3, 1000 units daily with 1 gram of calcium a day.  3. Hypertension, uncontrolled:  Increase Verapamil XR to 360 mg daily.      A low-salt diet.  4. Gout:  She is on allopurinol.  Obtain a uric acid level.  5. Type 2 diabetes, on therapy:  Has been fairly stable.  Obtain      hemoglobin A1c.  She will have to be on insulin following the      surgery.   Thank you very much for this consultation.  I will be happy to follow  with you postoperatively.     Georgina Quint. Plotnikov, MD     AVP/MedQ  DD: 10/12/2006  DT: 10/12/2006  Job #: [id number]   cc:   Lubertha Basque. Jerl Santos, M.D.

## 2010-10-29 ENCOUNTER — Other Ambulatory Visit: Payer: Self-pay

## 2010-10-29 ENCOUNTER — Other Ambulatory Visit: Payer: Self-pay | Admitting: Internal Medicine

## 2010-10-29 DIAGNOSIS — E538 Deficiency of other specified B group vitamins: Secondary | ICD-10-CM

## 2010-10-29 DIAGNOSIS — E119 Type 2 diabetes mellitus without complications: Secondary | ICD-10-CM

## 2010-10-30 ENCOUNTER — Other Ambulatory Visit (INDEPENDENT_AMBULATORY_CARE_PROVIDER_SITE_OTHER): Payer: Medicare PPO

## 2010-10-30 DIAGNOSIS — E538 Deficiency of other specified B group vitamins: Secondary | ICD-10-CM

## 2010-10-30 DIAGNOSIS — E119 Type 2 diabetes mellitus without complications: Secondary | ICD-10-CM

## 2010-10-30 LAB — BASIC METABOLIC PANEL
BUN: 20 mg/dL (ref 6–23)
CO2: 27 mEq/L (ref 19–32)
Calcium: 9.4 mg/dL (ref 8.4–10.5)
Creatinine, Ser: 0.8 mg/dL (ref 0.4–1.2)
Glucose, Bld: 190 mg/dL — ABNORMAL HIGH (ref 70–99)

## 2010-11-04 ENCOUNTER — Encounter: Payer: Self-pay | Admitting: Internal Medicine

## 2010-11-05 ENCOUNTER — Ambulatory Visit: Payer: Self-pay | Admitting: Internal Medicine

## 2010-11-05 ENCOUNTER — Encounter: Payer: Self-pay | Admitting: Internal Medicine

## 2010-11-05 ENCOUNTER — Ambulatory Visit (INDEPENDENT_AMBULATORY_CARE_PROVIDER_SITE_OTHER): Payer: Medicare PPO | Admitting: Internal Medicine

## 2010-11-05 DIAGNOSIS — E538 Deficiency of other specified B group vitamins: Secondary | ICD-10-CM

## 2010-11-05 DIAGNOSIS — M25519 Pain in unspecified shoulder: Secondary | ICD-10-CM

## 2010-11-05 DIAGNOSIS — R21 Rash and other nonspecific skin eruption: Secondary | ICD-10-CM | POA: Insufficient documentation

## 2010-11-05 DIAGNOSIS — E119 Type 2 diabetes mellitus without complications: Secondary | ICD-10-CM

## 2010-11-05 DIAGNOSIS — I10 Essential (primary) hypertension: Secondary | ICD-10-CM

## 2010-11-05 MED ORDER — TRIAMCINOLONE ACETONIDE 0.5 % EX CREA: TOPICAL_CREAM | Freq: Three times a day (TID) | CUTANEOUS | Status: DC

## 2010-11-05 MED ORDER — LORATADINE 10 MG PO TABS: 10.0000 mg | ORAL_TABLET | Freq: Every day | ORAL | Status: DC

## 2010-11-05 MED ORDER — METOPROLOL SUCCINATE ER 100 MG PO TB24: 100.0000 mg | ORAL_TABLET | Freq: Every day | ORAL | Status: DC

## 2010-11-05 MED ORDER — GLIMEPIRIDE 4 MG PO TABS: 4.0000 mg | ORAL_TABLET | Freq: Two times a day (BID) | ORAL | Status: DC

## 2010-11-05 MED ORDER — ALLOPURINOL 300 MG PO TABS: 300.0000 mg | ORAL_TABLET | Freq: Every day | ORAL | Status: DC

## 2010-11-05 NOTE — Assessment & Plan Note (Signed)
Cont Rx 

## 2010-11-05 NOTE — Progress Notes (Signed)
  Subjective:    Patient ID: Holly Banks, female    DOB: 1928-08-02, 77 y.o.   MRN: [id number]  HPI The patient presents for a follow-up of  chronic hypertension, chronic dyslipidemia, type 2 diabetes controlled with medicines  C/o B arm itching x 3 d She stopped LOsartan thinking it was Potassium  Wt Readings from Last 3 Encounters:  11/05/10 255 lb (115.667 kg)  07/07/10 252 lb (114.306 kg)  04/03/10 259 lb (117.482 kg)     Review of Systems  Constitutional: Negative for activity change and fatigue.  HENT: Negative for congestion and mouth sores.   Eyes: Negative for redness, itching and visual disturbance.  Cardiovascular: Positive for leg swelling. Negative for chest pain.  Gastrointestinal: Negative for abdominal distention.  Genitourinary: Negative for enuresis.  Neurological: Negative for seizures, light-headedness, numbness and headaches.   Lab Results  Component Value Date   WBC 5.8 03/31/2010   HGB 12.3 03/31/2010   HCT 38.0 03/31/2010   PLT 180.0 03/31/2010   CHOL 209* 03/31/2010   TRIG 132.0 03/31/2010   HDL 58.70 03/31/2010   LDLDIRECT 126.1 03/31/2010   ALT 13 03/31/2010   AST 18 03/31/2010   NA 139 10/30/2010   K 4.1 10/30/2010   CL 106 10/30/2010   CREATININE 0.8 10/30/2010   BUN 20 10/30/2010   CO2 27 10/30/2010   TSH 3.27 03/31/2010   HGBA1C 7.0* 10/30/2010   MICROALBUR 26.2* 06/30/2010      Objective:   Physical Exam  Constitutional: She appears well-developed and well-nourished. No distress.       Obese NAD  HENT:  Head: Normocephalic.  Right Ear: External ear normal.  Left Ear: External ear normal.  Nose: Nose normal.  Mouth/Throat: Oropharynx is clear and moist.  Eyes: Conjunctivae are normal. Pupils are equal, round, and reactive to light. Right eye exhibits no discharge. Left eye exhibits no discharge.  Neck: Normal range of motion. Neck supple. No JVD present. No tracheal deviation present. No thyromegaly present.  Cardiovascular:  Normal rate, regular rhythm and normal heart sounds.   Pulmonary/Chest: No stridor. No respiratory distress. She has no wheezes.  Abdominal: Soft. Bowel sounds are normal. She exhibits no distension and no mass. There is no tenderness. There is no rebound and no guarding.  Musculoskeletal: She exhibits no edema (trace B ankles) and no tenderness.       Using a cane  B shoulders w/dec ROM R>>L  Lymphadenopathy:    She has no cervical adenopathy.  Neurological: She displays normal reflexes. No cranial nerve deficit. She exhibits normal muscle tone. Coordination normal.  Skin: No rash noted. No erythema.  Psychiatric: She has a normal mood and affect. Her behavior is normal. Judgment and thought content normal.          Assessment & Plan:

## 2010-11-05 NOTE — Patient Instructions (Signed)
Use Loratidine and Triamcinolone for rash and itching

## 2010-11-05 NOTE — Assessment & Plan Note (Signed)
Exercise for ROM

## 2010-11-05 NOTE — Assessment & Plan Note (Signed)
Restart Cozaar °

## 2010-11-05 NOTE — Assessment & Plan Note (Signed)
Triamc Claritin ? Etiology

## 2010-12-30 ENCOUNTER — Encounter (INDEPENDENT_AMBULATORY_CARE_PROVIDER_SITE_OTHER): Payer: Medicare PPO | Admitting: Ophthalmology

## 2011-01-06 ENCOUNTER — Telehealth: Payer: Self-pay | Admitting: *Deleted

## 2011-01-06 NOTE — Telephone Encounter (Signed)
Spoke w/pt - She c/o pain in the right side of her lower back yesterday. She had trouble moving around w/o pain. No injuries or new activities. Pain today is almost 100% better - she declined OV for eval but will call office and schedule apt if pain returns.

## 2011-01-07 ENCOUNTER — Encounter (INDEPENDENT_AMBULATORY_CARE_PROVIDER_SITE_OTHER): Payer: Medicare PPO | Admitting: Ophthalmology

## 2011-01-07 DIAGNOSIS — H348192 Central retinal vein occlusion, unspecified eye, stable: Secondary | ICD-10-CM

## 2011-01-07 DIAGNOSIS — H35039 Hypertensive retinopathy, unspecified eye: Secondary | ICD-10-CM

## 2011-01-07 DIAGNOSIS — H43819 Vitreous degeneration, unspecified eye: Secondary | ICD-10-CM

## 2011-02-02 ENCOUNTER — Other Ambulatory Visit (INDEPENDENT_AMBULATORY_CARE_PROVIDER_SITE_OTHER): Payer: Medicare PPO

## 2011-02-02 DIAGNOSIS — I10 Essential (primary) hypertension: Secondary | ICD-10-CM

## 2011-02-02 DIAGNOSIS — E119 Type 2 diabetes mellitus without complications: Secondary | ICD-10-CM

## 2011-02-02 LAB — COMPREHENSIVE METABOLIC PANEL
ALT: 14 U/L (ref 0–35)
AST: 19 U/L (ref 0–37)
Alkaline Phosphatase: 56 U/L (ref 39–117)
Glucose, Bld: 167 mg/dL — ABNORMAL HIGH (ref 70–99)
Potassium: 3.9 mEq/L (ref 3.5–5.1)
Sodium: 143 mEq/L (ref 135–145)
Total Bilirubin: 0.7 mg/dL (ref 0.3–1.2)
Total Protein: 7 g/dL (ref 6.0–8.3)

## 2011-02-02 LAB — HEMOGLOBIN A1C: Hgb A1c MFr Bld: 7 % — ABNORMAL HIGH (ref 4.6–6.5)

## 2011-02-09 ENCOUNTER — Ambulatory Visit (INDEPENDENT_AMBULATORY_CARE_PROVIDER_SITE_OTHER): Payer: Medicare PPO | Admitting: Internal Medicine

## 2011-02-09 ENCOUNTER — Encounter: Payer: Self-pay | Admitting: Internal Medicine

## 2011-02-09 DIAGNOSIS — E119 Type 2 diabetes mellitus without complications: Secondary | ICD-10-CM

## 2011-02-09 DIAGNOSIS — E875 Hyperkalemia: Secondary | ICD-10-CM

## 2011-02-09 DIAGNOSIS — E538 Deficiency of other specified B group vitamins: Secondary | ICD-10-CM

## 2011-02-09 DIAGNOSIS — M1990 Unspecified osteoarthritis, unspecified site: Secondary | ICD-10-CM

## 2011-02-09 DIAGNOSIS — I10 Essential (primary) hypertension: Secondary | ICD-10-CM

## 2011-02-09 DIAGNOSIS — Z0000 Encounter for general adult medical examination without abnormal findings: Secondary | ICD-10-CM

## 2011-02-09 MED ORDER — VERAPAMIL HCL ER 360 MG PO CP24: 360.0000 mg | ORAL_CAPSULE | Freq: Every day | ORAL | Status: DC

## 2011-02-09 NOTE — Assessment & Plan Note (Signed)
Continue with current prescription therapy as reflected on the Med list.  

## 2011-02-09 NOTE — Assessment & Plan Note (Signed)
Use a cane 

## 2011-02-09 NOTE — Assessment & Plan Note (Signed)
Wt Readings from Last 3 Encounters:  02/09/11 256 lb (116.121 kg)  11/05/10 255 lb (115.667 kg)  07/07/10 252 lb (114.306 kg)

## 2011-02-09 NOTE — Progress Notes (Signed)
  Subjective:    Patient ID: Holly Banks, female    DOB: Aug 02, 1928, 77 y.o.   MRN: [id number]  HPI  The patient presents for a follow-up of  chronic hypertension, chronic dyslipidemia, type 2 diabetes controlled with medicines    Review of Systems  Constitutional: Negative for chills, activity change, appetite change, fatigue and unexpected weight change.  HENT: Negative for congestion, mouth sores and sinus pressure.   Eyes: Negative for visual disturbance.  Respiratory: Negative for cough and chest tightness.   Gastrointestinal: Negative for nausea and abdominal pain.  Genitourinary: Negative for frequency, difficulty urinating and vaginal pain.  Musculoskeletal: Negative for back pain and gait problem.  Skin: Negative for pallor and rash.  Neurological: Negative for dizziness, tremors, weakness, numbness and headaches.  Psychiatric/Behavioral: Negative for confusion and sleep disturbance.       Objective:   Physical Exam  Constitutional: She appears well-developed and well-nourished. No distress.  HENT:  Head: Normocephalic.  Right Ear: External ear normal.  Left Ear: External ear normal.  Nose: Nose normal.  Mouth/Throat: Oropharynx is clear and moist.  Eyes: Conjunctivae are normal. Pupils are equal, round, and reactive to light. Right eye exhibits no discharge. Left eye exhibits no discharge.  Neck: Normal range of motion. Neck supple. No JVD present. No tracheal deviation present. No thyromegaly present.  Cardiovascular: Normal rate, regular rhythm and normal heart sounds.   Pulmonary/Chest: No stridor. No respiratory distress. She has no wheezes.  Abdominal: Soft. Bowel sounds are normal. She exhibits no distension and no mass. There is no tenderness. There is no rebound and no guarding.  Musculoskeletal: She exhibits no edema and no tenderness.  Lymphadenopathy:    She has no cervical adenopathy.  Neurological: She displays normal reflexes. No cranial nerve  deficit. She exhibits normal muscle tone. Coordination normal.  Skin: No rash noted. No erythema.  Psychiatric: She has a normal mood and affect. Her behavior is normal. Judgment and thought content normal.    Lab Results  Component Value Date   WBC 5.8 03/31/2010   HGB 12.3 03/31/2010   HCT 38.0 03/31/2010   PLT 180.0 03/31/2010   CHOL 209* 03/31/2010   TRIG 132.0 03/31/2010   HDL 58.70 03/31/2010   LDLDIRECT 126.1 03/31/2010   ALT 14 02/02/2011   AST 19 02/02/2011   NA 143 02/02/2011   K 3.9 02/02/2011   CL 107 02/02/2011   CREATININE 0.7 02/02/2011   BUN 17 02/02/2011   CO2 27 02/02/2011   TSH 3.27 03/31/2010   HGBA1C 7.0* 02/02/2011   MICROALBUR 26.2* 06/30/2010         Assessment & Plan:

## 2011-03-03 LAB — CBC
HCT: 28.6 — ABNORMAL LOW
Hemoglobin: 7.6 — CL
Hemoglobin: 9.8 — ABNORMAL LOW
MCHC: 32
MCHC: 32.9
MCHC: 33
MCV: 85.5
MCV: 87
Platelets: 117 — ABNORMAL LOW
Platelets: 151
Platelets: 152
RBC: 2.78 — ABNORMAL LOW
RBC: 3.48 — ABNORMAL LOW
RDW: 13.4
RDW: 13.9
RDW: 13.9
RDW: 14.2
RDW: 14.5
WBC: 8.9

## 2011-03-03 LAB — PROTIME-INR
INR: 1.8 — ABNORMAL HIGH
INR: 1.8 — ABNORMAL HIGH
INR: 1.9 — ABNORMAL HIGH
Prothrombin Time: 15.3 — ABNORMAL HIGH
Prothrombin Time: 21.1 — ABNORMAL HIGH
Prothrombin Time: 21.4 — ABNORMAL HIGH
Prothrombin Time: 22.5 — ABNORMAL HIGH
Prothrombin Time: 23.9 — ABNORMAL HIGH
Prothrombin Time: 25.1 — ABNORMAL HIGH

## 2011-03-03 LAB — BASIC METABOLIC PANEL
BUN: 11
BUN: 12
BUN: 12
CO2: 30
CO2: 32
Calcium: 8.6
Calcium: 8.7
Calcium: 9.3
Chloride: 98
Creatinine, Ser: 0.68
Creatinine, Ser: 0.69
Creatinine, Ser: 0.79
GFR calc Af Amer: >60
GFR calc Af Amer: >60
GFR calc Af Amer: >60
GFR calc non Af Amer: >60
GFR calc non Af Amer: >60
GFR calc non Af Amer: >60
Glucose, Bld: 134 — ABNORMAL HIGH
Glucose, Bld: 137 — ABNORMAL HIGH
Glucose, Bld: 141 — ABNORMAL HIGH
Glucose, Bld: 188 — ABNORMAL HIGH
Glucose, Bld: 59 — ABNORMAL LOW
Potassium: 3.7
Sodium: 138
Sodium: 139

## 2011-03-03 LAB — CROSSMATCH: ABO/RH(D): A POS

## 2011-03-03 LAB — HEMOGLOBIN A1C: Mean Plasma Glucose: 133

## 2011-03-16 ENCOUNTER — Inpatient Hospital Stay (HOSPITAL_COMMUNITY)
Admission: EM | Admit: 2011-03-16 | Discharge: 2011-03-19 | DRG: 312 | Disposition: A | Discharge status: Home Health | Payer: Medicare PPO | Source: Ambulatory Visit | Attending: Internal Medicine | Admitting: Internal Medicine

## 2011-03-16 ENCOUNTER — Emergency Department (HOSPITAL_COMMUNITY): Payer: Medicare PPO

## 2011-03-16 DIAGNOSIS — A498 Other bacterial infections of unspecified site: Secondary | ICD-10-CM | POA: Diagnosis present

## 2011-03-16 DIAGNOSIS — Z96649 Presence of unspecified artificial hip joint: Secondary | ICD-10-CM

## 2011-03-16 DIAGNOSIS — K219 Gastro-esophageal reflux disease without esophagitis: Secondary | ICD-10-CM | POA: Diagnosis present

## 2011-03-16 DIAGNOSIS — M109 Gout, unspecified: Secondary | ICD-10-CM | POA: Diagnosis present

## 2011-03-16 DIAGNOSIS — E860 Dehydration: Secondary | ICD-10-CM | POA: Diagnosis present

## 2011-03-16 DIAGNOSIS — Z79899 Other long term (current) drug therapy: Secondary | ICD-10-CM

## 2011-03-16 DIAGNOSIS — Z880 Allergy status to penicillin: Secondary | ICD-10-CM

## 2011-03-16 DIAGNOSIS — I10 Essential (primary) hypertension: Secondary | ICD-10-CM | POA: Diagnosis present

## 2011-03-16 DIAGNOSIS — N390 Urinary tract infection, site not specified: Secondary | ICD-10-CM | POA: Diagnosis present

## 2011-03-16 DIAGNOSIS — Z7982 Long term (current) use of aspirin: Secondary | ICD-10-CM

## 2011-03-16 DIAGNOSIS — Z96659 Presence of unspecified artificial knee joint: Secondary | ICD-10-CM

## 2011-03-16 DIAGNOSIS — E119 Type 2 diabetes mellitus without complications: Secondary | ICD-10-CM | POA: Diagnosis present

## 2011-03-16 DIAGNOSIS — I951 Orthostatic hypotension: Principal | ICD-10-CM | POA: Diagnosis present

## 2011-03-16 DIAGNOSIS — I2510 Atherosclerotic heart disease of native coronary artery without angina pectoris: Secondary | ICD-10-CM | POA: Diagnosis present

## 2011-03-16 LAB — DIFFERENTIAL
Basophils Absolute: 0 10*3/uL (ref 0.0–0.1)
Basophils Relative: 0 % (ref 0–1)
Eosinophils Absolute: 0.1 10*3/uL (ref 0.0–0.7)
Eosinophils Relative: 2 % (ref 0–5)
Monocytes Absolute: 0.8 10*3/uL (ref 0.1–1.0)
Monocytes Relative: 12 % (ref 3–12)
Neutro Abs: 3.2 10*3/uL (ref 1.7–7.7)

## 2011-03-16 LAB — CBC
MCH: 27.9 pg (ref 26.0–34.0)
MCHC: 34 g/dL (ref 30.0–36.0)
Platelets: 199 10*3/uL (ref 150–400)
RDW: 13.6 % (ref 11.5–15.5)

## 2011-03-16 LAB — POCT I-STAT, CHEM 8
Creatinine, Ser: 0.9 mg/dL (ref 0.50–1.10)
HCT: 41 % (ref 36.0–46.0)
Hemoglobin: 13.9 g/dL (ref 12.0–15.0)
Potassium: 4.1 mEq/L (ref 3.5–5.1)
Sodium: 140 mEq/L (ref 135–145)
TCO2: 25 mmol/L (ref 0–100)

## 2011-03-16 LAB — BASIC METABOLIC PANEL
BUN: 19 mg/dL (ref 6–23)
Chloride: 104 mEq/L (ref 96–112)
GFR calc Af Amer: 71 mL/min — ABNORMAL LOW (ref >90)
GFR calc non Af Amer: 61 mL/min — ABNORMAL LOW (ref >90)
Potassium: 4.6 mEq/L (ref 3.5–5.1)
Sodium: 140 mEq/L (ref 135–145)

## 2011-03-16 LAB — POCT I-STAT TROPONIN I: Troponin i, poc: 0.01 ng/mL (ref 0.00–0.08)

## 2011-03-17 ENCOUNTER — Inpatient Hospital Stay (HOSPITAL_COMMUNITY): Payer: Medicare PPO

## 2011-03-17 ENCOUNTER — Emergency Department (HOSPITAL_COMMUNITY): Payer: Medicare PPO

## 2011-03-17 DIAGNOSIS — I379 Nonrheumatic pulmonary valve disorder, unspecified: Secondary | ICD-10-CM

## 2011-03-17 LAB — URINALYSIS, ROUTINE W REFLEX MICROSCOPIC
Bilirubin Urine: NEGATIVE
Hgb urine dipstick: NEGATIVE
Ketones, ur: NEGATIVE mg/dL
Specific Gravity, Urine: 1.012 (ref 1.005–1.030)
Urobilinogen, UA: 0.2 mg/dL (ref 0.0–1.0)

## 2011-03-17 LAB — CARDIAC PANEL(CRET KIN+CKTOT+MB+TROPI)
CK, MB: 3.3 ng/mL (ref 0.3–4.0)
CK, MB: 3.2 ng/mL (ref 0.3–4.0)
Relative Index: 3 — ABNORMAL HIGH (ref 0.0–2.5)
Total CK: 96 U/L (ref 7–177)
Total CK: 105 U/L (ref 7–177)
Troponin I: <0.3 ng/mL (ref <0.30)

## 2011-03-17 LAB — URINE MICROSCOPIC-ADD ON

## 2011-03-17 LAB — GLUCOSE, CAPILLARY
Glucose-Capillary: 118 mg/dL — ABNORMAL HIGH (ref 70–99)
Glucose-Capillary: 134 mg/dL — ABNORMAL HIGH (ref 70–99)
Glucose-Capillary: 151 mg/dL — ABNORMAL HIGH (ref 70–99)
Glucose-Capillary: 174 mg/dL — ABNORMAL HIGH (ref 70–99)

## 2011-03-18 DIAGNOSIS — R55 Syncope and collapse: Secondary | ICD-10-CM

## 2011-03-18 LAB — COMPREHENSIVE METABOLIC PANEL
Albumin: 3.2 g/dL — ABNORMAL LOW (ref 3.5–5.2)
BUN: 14 mg/dL (ref 6–23)
Chloride: 108 mEq/L (ref 96–112)
Creatinine, Ser: 0.59 mg/dL (ref 0.50–1.10)
GFR calc Af Amer: >90 mL/min (ref >90)
GFR calc non Af Amer: 84 mL/min — ABNORMAL LOW (ref >90)
Total Bilirubin: 0.5 mg/dL (ref 0.3–1.2)

## 2011-03-18 LAB — GLUCOSE, CAPILLARY
Glucose-Capillary: 147 mg/dL — ABNORMAL HIGH (ref 70–99)
Glucose-Capillary: 159 mg/dL — ABNORMAL HIGH (ref 70–99)

## 2011-03-18 LAB — CBC
Hemoglobin: 10.4 g/dL — ABNORMAL LOW (ref 12.0–15.0)
MCH: 26.8 pg (ref 26.0–34.0)
MCHC: 33 g/dL (ref 30.0–36.0)
Platelets: 170 10*3/uL (ref 150–400)

## 2011-03-18 LAB — CORTISOL: Cortisol, Plasma: 6.8 ug/dL

## 2011-03-18 LAB — LIPID PANEL
Cholesterol: 176 mg/dL (ref 0–200)
Triglycerides: 107 mg/dL (ref <150)
VLDL: 21 mg/dL (ref 0–40)

## 2011-03-18 LAB — TSH: TSH: 3.568 u[IU]/mL (ref 0.350–4.500)

## 2011-03-19 LAB — GLUCOSE, CAPILLARY
Glucose-Capillary: 189 mg/dL — ABNORMAL HIGH (ref 70–99)
Glucose-Capillary: 157 mg/dL — ABNORMAL HIGH (ref 70–99)

## 2011-03-19 LAB — CBC
MCH: 26.8 pg (ref 26.0–34.0)
MCHC: 32.8 g/dL (ref 30.0–36.0)
MCV: 81.7 fL (ref 78.0–100.0)
Platelets: 172 10*3/uL (ref 150–400)
RDW: 13.8 % (ref 11.5–15.5)

## 2011-03-19 LAB — BASIC METABOLIC PANEL
CO2: 25 mEq/L (ref 19–32)
Calcium: 9.2 mg/dL (ref 8.4–10.5)
Creatinine, Ser: 0.61 mg/dL (ref 0.50–1.10)
GFR calc Af Amer: >90 mL/min (ref >90)
GFR calc non Af Amer: 83 mL/min — ABNORMAL LOW (ref >90)

## 2011-03-19 LAB — URINE CULTURE

## 2011-03-20 NOTE — Discharge Summary (Signed)
NAMESWARA, Holly NO.:  [id number]  MEDICAL RECORD NO.:  [id number]  LOCATION:  4713                         FACILITY:  [facility]  PHYSICIAN:  Isidor Holts, M.D.  DATE OF BIRTH:  1929/03/07  DATE OF ADMISSION:  03/16/2011 DATE OF DISCHARGE:  03/19/2011                              DISCHARGE SUMMARY   PRIMARY PHYSICIAN:  Georgina Quint. Plotnikov, MD  DISCHARGE DIAGNOSES: 1. Presyncopal episode. 2. Mild dehydration/orthostasis, causing presyncopal episode. 3. Escherichia coli urinary tract infection. 4. Coronary artery disease. 5. Hypertension. 6. History of abdominal aortic aneurysm repair. 7. Type 2 diabetes mellitus. 8. Gastroesophageal reflux disease. 9. Gout. 10.History of multiple orthopedic surgeries.  DISCHARGE MEDICATIONS: 1. Levaquin 500 mg p.o. b.i.d. for 5 days from May 20, 2011. 2. Allopurinol 300 mg p.o. daily. 3. Amaryl 4 mg p.o. b.i.d. 4. Aspirin OTC 81 mg p.o. daily. 5. Klor-Con 10 mEq p.o. daily. 6. Losartan 50 mg p.o. daily. 7. Metformin 500 mg p.o. b.i.d. 8. Verapamil SR 360 mg p.o. daily. 9. Vitamin B12 OTC 1000 mcg p.o. daily. 10.Vitamin D3, 1000 units p.o. daily.    Note:  Toprol-XL has been     discontinued secondary to orthostasis.  PROCEDURES: 1. Chest x-ray on March 09, 2011.  This showed cardiomegaly with     central vascular congestion.  No overt edema or focal     consolidation. 2. Head CT scan on March 17, 2011.  This showed mild white matter     hypodensities.  No definite acute intracranial abnormality. 3. Bilateral carotid/vertebral artery duplex scan on March 18, 2011.     This showed no significant extracranial carotid artery stenosis.     Vertebrals are patent with antegrade flow. 4. A 2D echocardiogram on March 17, 2011.  This showed normal left     ventricular cavity size, wall thickness increased in pattern of     mild LVH.  There was mild focal basal hypertrophy of the septum.     Systolic  function was normal with an estimated EF of 55%-60%.     Regional wall motion abnormalities cannot be excluded.  Doppler     parameters were consistent with grade 1 diastolic dysfunction.  The     left atrium was mildly dilated.  CONSULTATIONS NONE:  Admission.  HISTORY:  As in H and P notes of March 16, 2011, dictated by Dr. Lonia Blood.  However, in brief, this is an 77 year old female, with known history of coronary artery disease, hypertension, type 2 diabetes mellitus, GERD, gout, history of arrhythmia, history of surgical release for carpal tunnel syndrome status post bilateral hip replacement status post right knee replacement, presenting with a 2-day history of postural dizziness and almost "passing out."  There was no associated chest pain or palpitations.  No shortness of breath.  The patient was admitted for further evaluation, investigation, and management.  CLINICAL COURSE: 1. Presyncope.  This was felt to be multifactorial, associated with     postural dizziness.  For further details, see below.  2. Mild dehydration/orthostasis.  The patient's BUN was 19, creatinine     0.87.  She did have orthostasis on postural blood pressure     measurement.  She  was therefore, commenced on intravenous fluid     hydration with normal saline. Antihypertensive medications, i.e.     calcium channel blocker, beta-blocker, and ARB were placed on hold,     and by March 18, 2011, she had no further postural symptoms.  We     were, therefore, able to discontinue intravenous fluids, without     deleterious effects.  3. Urinary tract infection.  The patient did have a positive urinary     sediment, consistent with urinary tract infection.  She was     commenced empirically on intravenous Rocephin and by March 19, 2011, urine culture demonstrated pansensitive E. coli.  The patient     had no episodes of pyrexia during the course of her     hospitalization.  White cell count remained  within normal limits.     She felt quite well on March 19, 2011.  She has, therefore, been     transitioned to oral Levaquin for further 5 days of antibiotic     treatment commencing on March 20, 2011.  4. Coronary artery disease.  There were no problems referable to this.     A 12-lead EKG showed no acute ischemic changes.  The patient had no     arrhythmias on cardiac monitor, and cardiac enzymes were cycled,     remained unelevated.  The patient had no evidence of chest pain     during the course of her hospitalization.  5. Gout.  The patient remained asymptomatic from this viewpoint.  6. Type 2 diabetes mellitus.  This was managed with oral hypoglycemic     agents, sliding-scale insulin coverage, and carbohydrate-modified     diet. The patient remained euglycemic.  7. Hypertension.  The patient's blood pressure started creeping up as     of March 18, 2011.  She was, therefore, recommenced on     preadmission calcium channel antagonist as well as losartan.     However, we have continued to hold the patient's Lopressor.  8. GERD.  The patient was asymptomatic from this viewpoint.  DISPOSITION:  The patient was on March 19, 2011, asymptomatic, keen to be discharged with no new issues.  She was considered clinically stable for discharge and, therefore, discharged accordingly.  She was evaluated by PT/OT during this hospitalization and no acute PT/OT needs identified, however, she has been recommended Home Health PT safety evaluation.  Arrangements have therefore, been put in place.  DIET:  Heart healthy/carbohydrate modified.  ACTIVITY:  As tolerated.  Recommended to increase activity slowly.  FOLLOWUP INSTRUCTIONS:  The patient will follow up routinely with her primary MD, Dr. Jacinta Shoe per prior appointment.  SPECIAL INSTRUCTIONS:  Home Health PT for safety evaluation has been arranged.     Isidor Holts, M.D.     CO/MEDQ  D:  03/19/2011  T:   03/19/2011  Job:  [id number]  cc:   Georgina Quint. Plotnikov, MD  Electronically Signed by Isidor Holts M.D. on 03/20/2011 04:21:44 PM

## 2011-03-20 NOTE — H&P (Signed)
Holly Banks, Holly Banks NO.:  [id number]  MEDICAL RECORD NO.:  [id number]  LOCATION:  4713                         FACILITY:  [facility]  PHYSICIAN:  Lonia Blood, M.D.      DATE OF BIRTH:  05/17/1929  DATE OF ADMISSION:  03/16/2011 DATE OF DISCHARGE:                             HISTORY & PHYSICAL   PRIMARY CARE PHYSICIAN:  Georgina Quint. Plotnikov, MD  PRESENTING COMPLAINT:  Almost passed out.  HISTORY OF PRESENT ILLNESS:  The patient is an 77 year old female who presented to the ED with complaint of near syncope.  This has been going on since yesterday, she has had multiple episodes.  Denied any recent dizziness.  No recent nausea, vomiting, or diarrhea.  Symptoms have been persistent and severe.  No identifiable aggravating factor.  No diarrhea.  No constipation.  The patient is here with her daughter who is partly giving the history.  PAST MEDICAL HISTORY:  Significant for coronary artery disease, hypertension, type 2 diabetes, GERD, gout, and history of irregular heart rate.  The patient also has history of carpal tunnel syndrome with release surgery statu post hip replacement, status post knee replacement.  ALLERGIES:  PENICILLIN.  CURRENT MEDICATIONS:  Allopurinol, Amaryl, aspirin, Cozaar, metformin, potassium chloride, Toprol-XL, and verapamil.  SOCIAL HISTORY:  The patient lives with her daughter.  She denied tobacco, alcohol, or IV drug use.  FAMILY HISTORY:  Noncontributory.  REVIEW OF SYSTEMS:  All systems reviewed currently are negative except per HPI.  PHYSICAL EXAMINATION:  VITAL SIGNS:  Temperature 98.2 orally, blood pressure initially 150/65 with pulse 66, respiratory rate of 20, and her sat is 100% on room air. GENERAL: She is awake, alert, oriented.  She is in no acute distress. HEENT:  PERRL.  EOMI.  No significant pallor.  No jaundice.  No rhinorrhea. NECK:  Supple.  No JVD.  No lymphadenopathy. RESPIRATORY:  She has good air entry  bilaterally.  No wheezes, no rales, no crackles. CARDIOVASCULAR SYSTEM:  She has S1, S2.  No murmur. ABDOMEN:  Soft, full, nontender with positive bowel sounds. EXTREMITIES:  No edema, cyanosis, or clubbing. SKIN:  No rashes.  No ulcers.  LABORATORY DATA:  Urinalysis showed trace leukocyte esterase, cloudy urine.  Urine microscopy showed wbc'Holly 3-6 with many bacteria.  Chest x-ray shows cardiomegaly with central venous congestion, but no overt edema.  Sodium is 140, potassium 4.6, chloride 104, CO2 of 28, glucose 120, BUN 19, and creatinine 0.87.  White count 6.2, hemoglobin 12.0, and platelet count of 199 and regular differentials.  Head CT without contrast, not done.  Her EKG showed sinus bradycardia with a rate of 56.  Normal axis. There is mildly prolonged PR interval, but normal T-waves.  No significant ST-T wave changes.  ASSESSMENT:  This is an 77 year old female presenting with what appears to be presyncope, but profoundly orthostatic.  The patient has received at least 2 L of normal saline in the ED and still remains slightly orthostatic.  The cause of her orthostatic hypotension is not entirely clear.  It could be related to her blood pressure medications, but she has been on that for a while.  No nausea, vomiting, or diarrhea and her resting  blood pressure seems to be stable.  She is not hypotensive, only when she gets up.  She may have secondary adrenal insufficiency.  Even though there is no history of steroid intake or anything else that make that happen.  PLAN: 1. Presyncope.  Admit the patient to tele bed.  Rule out arrhythmias     and cardiac disease.  Getting 2D echo with contrast.  Carotid     Dopplers.  I will check CT head without contrast as a start and     possibly MRI thereafter if needed, even though I suspect this is     more cardiac than anything else.  I will continue with hydration     cautiously.  We will check random cortisol today and may be a.m.      cortisol tomorrow.  If it is abnormal, the patient may benefit from     fludrocortisone. 2. Diabetes.  I will put her on sliding scale insulin and continue     with her oral medication except for metformin. 3. Hypertension.  Again, the patient'Holly blood pressure is okay at this     point, we will resume her home medicines gently. 4. Gout.  Continue with home medications. 5. Gastroesophageal reflux disease.  I gave her PPIs.  The patient may     also need to get Cardiology consult depending on how her labs turn     out.     Lonia Blood, M.D.     Verlin Grills  D:  03/17/2011  T:  03/17/2011  Job:  [id number]  Electronically Signed by Lonia Blood M.D. on 03/20/2011 03:28:51 PM

## 2011-03-31 ENCOUNTER — Ambulatory Visit (INDEPENDENT_AMBULATORY_CARE_PROVIDER_SITE_OTHER): Payer: Medicare PPO | Admitting: Internal Medicine

## 2011-03-31 ENCOUNTER — Encounter: Payer: Self-pay | Admitting: Internal Medicine

## 2011-03-31 VITALS — BP 158/88 | HR 78 | Temp 98.0°F | Resp 16 | Wt 253.0 lb

## 2011-03-31 DIAGNOSIS — I10 Essential (primary) hypertension: Secondary | ICD-10-CM

## 2011-03-31 DIAGNOSIS — E119 Type 2 diabetes mellitus without complications: Secondary | ICD-10-CM

## 2011-03-31 DIAGNOSIS — E538 Deficiency of other specified B group vitamins: Secondary | ICD-10-CM

## 2011-03-31 DIAGNOSIS — N390 Urinary tract infection, site not specified: Secondary | ICD-10-CM

## 2011-03-31 NOTE — Assessment & Plan Note (Signed)
Just finished levaquin

## 2011-03-31 NOTE — Assessment & Plan Note (Signed)
Continue with current prescription therapy as reflected on the Med list. BP was ok at home

## 2011-03-31 NOTE — Progress Notes (Signed)
  Subjective:    Patient ID: Holly Banks, female    DOB: 21-Sep-1928, 77 y.o.   MRN: [id number]  HPI The patient presents for a follow-up of  hosp stay in 10/12:  DISCHARGE DIAGNOSES:  1. Presyncopal episode.  2. Mild dehydration/orthostasis, causing presyncopal episode.  3. Escherichia coli urinary tract infection.  4. Coronary artery disease.  5. Hypertension.  6. History of abdominal aortic aneurysm repair.  7. Type 2 diabetes mellitus.  8. Gastroesophageal reflux disease.  9. Gout.  10.History of multiple orthopedic surgeries.   Doing ok now. BP was 130/70 at home   Review of Systems  Constitutional: Negative for chills, activity change, appetite change, fatigue and unexpected weight change.  HENT: Negative for congestion, mouth sores and sinus pressure.   Eyes: Negative for visual disturbance.  Respiratory: Negative for cough and chest tightness.   Gastrointestinal: Negative for nausea and abdominal pain.  Genitourinary: Negative for frequency, difficulty urinating and vaginal pain.  Musculoskeletal: Positive for arthralgias and gait problem. Negative for back pain.  Skin: Negative for pallor and rash.  Neurological: Negative for dizziness, tremors, weakness, numbness and headaches.  Psychiatric/Behavioral: Negative for confusion and sleep disturbance.   Wt Readings from Last 3 Encounters:  03/31/11 253 lb (114.76 kg)  02/09/11 256 lb (116.121 kg)  11/05/10 255 lb (115.667 kg)       Objective:   Physical Exam  Constitutional: She appears well-developed. No distress.       obese  HENT:  Head: Normocephalic.  Right Ear: External ear normal.  Left Ear: External ear normal.  Nose: Nose normal.  Mouth/Throat: Oropharynx is clear and moist.  Eyes: Conjunctivae are normal. Pupils are equal, round, and reactive to light. Right eye exhibits no discharge. Left eye exhibits no discharge.  Neck: Normal range of motion. Neck supple. No JVD present. No tracheal deviation  present. No thyromegaly present.  Cardiovascular: Normal rate, regular rhythm and normal heart sounds.   Pulmonary/Chest: No stridor. No respiratory distress. She has no wheezes.  Abdominal: Soft. Bowel sounds are normal. She exhibits no distension and no mass. There is no tenderness. There is no rebound and no guarding.  Musculoskeletal: She exhibits no edema and no tenderness.  Lymphadenopathy:    She has no cervical adenopathy.  Neurological: She displays normal reflexes. No cranial nerve deficit. She exhibits normal muscle tone. Coordination normal.  Skin: No rash noted. No erythema.  Psychiatric: She has a normal mood and affect. Her behavior is normal. Judgment and thought content normal.     Lab Results  Component Value Date   WBC 5.4 03/19/2011   HGB 10.4* 03/19/2011   HCT 31.7* 03/19/2011   PLT 172 03/19/2011   GLUCOSE 129* 03/19/2011   CHOL 176 03/18/2011   TRIG 107 03/18/2011   HDL 55 03/18/2011   LDLDIRECT 126.1 03/31/2010   LDLCALC 100* 03/18/2011   ALT 10 03/18/2011   AST 14 03/18/2011   NA 143 03/19/2011   K 3.3* 03/19/2011   CL 109 03/19/2011   CREATININE 0.61 03/19/2011   BUN 15 03/19/2011   CO2 25 03/19/2011   TSH 3.568 03/18/2011   INR 2.2* 10/25/2007   HGBA1C 6.6* 03/17/2011   MICROALBUR 26.2* 06/30/2010        Assessment & Plan:

## 2011-03-31 NOTE — Assessment & Plan Note (Signed)
Continue with current prescription therapy as reflected on the Med list.  

## 2011-04-06 DIAGNOSIS — R269 Unspecified abnormalities of gait and mobility: Secondary | ICD-10-CM

## 2011-04-06 DIAGNOSIS — IMO0001 Reserved for inherently not codable concepts without codable children: Secondary | ICD-10-CM

## 2011-04-06 DIAGNOSIS — R55 Syncope and collapse: Secondary | ICD-10-CM

## 2011-04-06 DIAGNOSIS — M6281 Muscle weakness (generalized): Secondary | ICD-10-CM

## 2011-05-04 ENCOUNTER — Other Ambulatory Visit: Payer: Self-pay | Admitting: Internal Medicine

## 2011-05-04 ENCOUNTER — Other Ambulatory Visit (INDEPENDENT_AMBULATORY_CARE_PROVIDER_SITE_OTHER): Payer: Medicare PPO

## 2011-05-04 DIAGNOSIS — Z0000 Encounter for general adult medical examination without abnormal findings: Secondary | ICD-10-CM

## 2011-05-04 DIAGNOSIS — E538 Deficiency of other specified B group vitamins: Secondary | ICD-10-CM

## 2011-05-04 DIAGNOSIS — E119 Type 2 diabetes mellitus without complications: Secondary | ICD-10-CM

## 2011-05-04 DIAGNOSIS — I10 Essential (primary) hypertension: Secondary | ICD-10-CM

## 2011-05-04 LAB — COMPREHENSIVE METABOLIC PANEL
AST: 18 U/L (ref 0–37)
Albumin: 3.9 g/dL (ref 3.5–5.2)
Alkaline Phosphatase: 49 U/L (ref 39–117)
BUN: 17 mg/dL (ref 6–23)
Calcium: 9.8 mg/dL (ref 8.4–10.5)
Chloride: 108 mEq/L (ref 96–112)
Glucose, Bld: 143 mg/dL — ABNORMAL HIGH (ref 70–99)
Potassium: 4.1 mEq/L (ref 3.5–5.1)
Sodium: 143 mEq/L (ref 135–145)
Total Protein: 7.3 g/dL (ref 6.0–8.3)

## 2011-05-04 LAB — CBC WITH DIFFERENTIAL/PLATELET
Basophils Absolute: 0 10*3/uL (ref 0.0–0.1)
Basophils Relative: 0.5 % (ref 0.0–3.0)
Eosinophils Relative: 1.8 % (ref 0.0–5.0)
HCT: 37.3 % (ref 36.0–46.0)
Hemoglobin: 11.8 g/dL — ABNORMAL LOW (ref 12.0–15.0)
Lymphocytes Relative: 30.5 % (ref 12.0–46.0)
Lymphs Abs: 1.7 10*3/uL (ref 0.7–4.0)
Monocytes Relative: 10.8 % (ref 3.0–12.0)
Neutro Abs: 3.2 10*3/uL (ref 1.4–7.7)
RBC: 4.24 Mil/uL (ref 3.87–5.11)
WBC: 5.6 10*3/uL (ref 4.5–10.5)

## 2011-05-04 LAB — LIPID PANEL
LDL Cholesterol: 113 mg/dL — ABNORMAL HIGH (ref 0–99)
Total CHOL/HDL Ratio: 3

## 2011-05-04 LAB — URINALYSIS, ROUTINE W REFLEX MICROSCOPIC
Bilirubin Urine: NEGATIVE
Nitrite: NEGATIVE
Specific Gravity, Urine: >=1.03 (ref 1.000–1.030)
pH: 6 (ref 5.0–8.0)

## 2011-05-04 LAB — VITAMIN B12: Vitamin B-12: 582 pg/mL (ref 211–911)

## 2011-05-10 ENCOUNTER — Ambulatory Visit (INDEPENDENT_AMBULATORY_CARE_PROVIDER_SITE_OTHER): Payer: Medicare PPO | Admitting: Ophthalmology

## 2011-05-10 DIAGNOSIS — I10 Essential (primary) hypertension: Secondary | ICD-10-CM

## 2011-05-10 DIAGNOSIS — H348192 Central retinal vein occlusion, unspecified eye, stable: Secondary | ICD-10-CM

## 2011-05-10 DIAGNOSIS — H211X9 Other vascular disorders of iris and ciliary body, unspecified eye: Secondary | ICD-10-CM

## 2011-05-10 DIAGNOSIS — H35039 Hypertensive retinopathy, unspecified eye: Secondary | ICD-10-CM

## 2011-05-11 ENCOUNTER — Encounter: Payer: Self-pay | Admitting: Internal Medicine

## 2011-05-11 ENCOUNTER — Other Ambulatory Visit: Payer: Medicare PPO

## 2011-05-11 ENCOUNTER — Ambulatory Visit (INDEPENDENT_AMBULATORY_CARE_PROVIDER_SITE_OTHER): Payer: Medicare PPO | Admitting: Internal Medicine

## 2011-05-11 VITALS — BP 140/76 | HR 76 | Temp 98.0°F | Resp 16 | Wt 254.0 lb

## 2011-05-11 DIAGNOSIS — M1990 Unspecified osteoarthritis, unspecified site: Secondary | ICD-10-CM

## 2011-05-11 DIAGNOSIS — E119 Type 2 diabetes mellitus without complications: Secondary | ICD-10-CM

## 2011-05-11 DIAGNOSIS — E538 Deficiency of other specified B group vitamins: Secondary | ICD-10-CM

## 2011-05-11 DIAGNOSIS — N390 Urinary tract infection, site not specified: Secondary | ICD-10-CM

## 2011-05-11 MED ORDER — CIPROFLOXACIN HCL 250 MG PO TABS: 250.0000 mg | ORAL_TABLET | Freq: Two times a day (BID) | ORAL | Status: DC

## 2011-05-11 NOTE — Assessment & Plan Note (Signed)
Continue with current prescription therapy as reflected on the Med list.  

## 2011-05-11 NOTE — Assessment & Plan Note (Signed)
Cipro x 7 d Urine cx

## 2011-05-11 NOTE — Progress Notes (Signed)
  Subjective:    Patient ID: Holly Banks, female    DOB: 11-Nov-1928, 77 y.o.   MRN: [id number]  HPI The patient presents for a follow-up of  chronic hypertension, chronic dyslipidemia, type 2 diabetes controlled with medicines  C/o UTI sx's in the past   Review of Systems  Constitutional: Negative for chills, activity change, appetite change, fatigue and unexpected weight change.  HENT: Negative for congestion, mouth sores and sinus pressure.   Eyes: Negative for visual disturbance.  Respiratory: Negative for cough and chest tightness.   Gastrointestinal: Negative for nausea and abdominal pain.  Genitourinary: Negative for frequency, difficulty urinating and vaginal pain.  Musculoskeletal: Positive for arthralgias and gait problem. Negative for back pain.  Skin: Negative for pallor and rash.  Neurological: Negative for dizziness, tremors, weakness, numbness and headaches.  Psychiatric/Behavioral: Negative for confusion and sleep disturbance. The patient is not nervous/anxious.        Objective:   Physical Exam  Constitutional: She appears well-developed. No distress.       obese  HENT:  Head: Normocephalic.  Right Ear: External ear normal.  Left Ear: External ear normal.  Nose: Nose normal.  Mouth/Throat: Oropharynx is clear and moist.  Eyes: Conjunctivae are normal. Pupils are equal, round, and reactive to light. Right eye exhibits no discharge. Left eye exhibits no discharge.  Neck: Normal range of motion. Neck supple. No JVD present. No tracheal deviation present. No thyromegaly present.  Cardiovascular: Normal rate, regular rhythm and normal heart sounds.   Pulmonary/Chest: No stridor. No respiratory distress. She has no wheezes.  Abdominal: Soft. Bowel sounds are normal. She exhibits no distension and no mass. There is no tenderness. There is no rebound and no guarding.  Musculoskeletal: She exhibits tenderness. She exhibits no edema.       Using  A cane    Lymphadenopathy:    She has no cervical adenopathy.  Neurological: She displays normal reflexes. No cranial nerve deficit. She exhibits normal muscle tone. Coordination abnormal.  Skin: No rash noted. No erythema.  Psychiatric: She has a normal mood and affect. Her behavior is normal. Judgment and thought content normal.      Lab Results  Component Value Date   WBC 5.6 05/04/2011   HGB 11.8* 05/04/2011   HCT 37.3 05/04/2011   PLT 196.0 05/04/2011   GLUCOSE 143* 05/04/2011   CHOL 198 05/04/2011   TRIG 116.0 05/04/2011   HDL 62.30 05/04/2011   LDLDIRECT 126.1 03/31/2010   LDLCALC 113* 05/04/2011   ALT 15 05/04/2011   AST 18 05/04/2011   NA 143 05/04/2011   K 4.1 05/04/2011   CL 108 05/04/2011   CREATININE 0.8 05/04/2011   BUN 17 05/04/2011   CO2 27 05/04/2011   TSH 3.58 05/04/2011   INR 2.2* 10/25/2007   HGBA1C 6.3 05/04/2011   MICROALBUR 26.2* 06/30/2010       Assessment & Plan:

## 2011-05-12 ENCOUNTER — Other Ambulatory Visit: Payer: Self-pay | Admitting: *Deleted

## 2011-05-12 MED ORDER — CIPROFLOXACIN HCL 250 MG PO TABS: 250.0000 mg | ORAL_TABLET | Freq: Two times a day (BID) | ORAL | Status: AC

## 2011-05-13 ENCOUNTER — Telehealth: Payer: Self-pay | Admitting: *Deleted

## 2011-05-13 NOTE — Telephone Encounter (Signed)
Rec fax requesting to verify if it is ok to dispense Cipro since pt is taking Amaryl due to possible interactions. Ok to fill both meds together?

## 2011-05-13 NOTE — Telephone Encounter (Signed)
Per AVP- yes. pharmacy informed

## 2011-05-14 LAB — CULTURE, URINE COMPREHENSIVE: Colony Count: >=100000

## 2011-08-11 ENCOUNTER — Ambulatory Visit: Payer: Medicare PPO | Admitting: Internal Medicine

## 2011-08-27 ENCOUNTER — Other Ambulatory Visit (INDEPENDENT_AMBULATORY_CARE_PROVIDER_SITE_OTHER): Payer: Medicare PPO

## 2011-08-27 DIAGNOSIS — E119 Type 2 diabetes mellitus without complications: Secondary | ICD-10-CM

## 2011-08-27 DIAGNOSIS — E538 Deficiency of other specified B group vitamins: Secondary | ICD-10-CM

## 2011-08-27 LAB — BASIC METABOLIC PANEL
Chloride: 104 mEq/L (ref 96–112)
GFR: 86.03 mL/min (ref >60.00)
Glucose, Bld: 153 mg/dL — ABNORMAL HIGH (ref 70–99)
Potassium: 3.6 mEq/L (ref 3.5–5.1)
Sodium: 141 mEq/L (ref 135–145)

## 2011-09-01 ENCOUNTER — Encounter: Payer: Self-pay | Admitting: Internal Medicine

## 2011-09-01 ENCOUNTER — Ambulatory Visit (INDEPENDENT_AMBULATORY_CARE_PROVIDER_SITE_OTHER): Payer: Medicare PPO | Admitting: Internal Medicine

## 2011-09-01 VITALS — BP 150/88 | HR 88 | Temp 97.9°F | Resp 16 | Wt 252.0 lb

## 2011-09-01 DIAGNOSIS — M1990 Unspecified osteoarthritis, unspecified site: Secondary | ICD-10-CM

## 2011-09-01 DIAGNOSIS — E119 Type 2 diabetes mellitus without complications: Secondary | ICD-10-CM

## 2011-09-01 DIAGNOSIS — E538 Deficiency of other specified B group vitamins: Secondary | ICD-10-CM

## 2011-09-01 DIAGNOSIS — I10 Essential (primary) hypertension: Secondary | ICD-10-CM

## 2011-09-01 MED ORDER — HYDROCODONE-ACETAMINOPHEN 5-325 MG PO TABS: 1.0000 | ORAL_TABLET | Freq: Two times a day (BID) | ORAL | Status: DC | PRN

## 2011-09-01 NOTE — Progress Notes (Signed)
Patient ID: Holly Banks, female   DOB: February 19, 1929, 78 y.o.   MRN: [id number]  Subjective:    Patient ID: Holly Banks, female    DOB: Aug 23, 1928, 78 y.o.   MRN: [id number]  Knee Pain  Pertinent negatives include no numbness.   The patient presents for a follow-up of  chronic hypertension, chronic dyslipidemia, type 2 diabetes controlled with medicines  C/o L knee pain is worse  Wt Readings from Last 3 Encounters:  09/01/11 252 lb (114.306 kg)  05/11/11 254 lb (115.214 kg)  03/31/11 253 lb (114.76 kg)   BP Readings from Last 3 Encounters:  09/01/11 150/88  05/11/11 140/76  03/31/11 158/88       Review of Systems  Constitutional: Negative for chills, activity change, appetite change, fatigue and unexpected weight change.  HENT: Negative for congestion, mouth sores and sinus pressure.   Eyes: Negative for visual disturbance.  Respiratory: Negative for cough and chest tightness.   Gastrointestinal: Negative for nausea and abdominal pain.  Genitourinary: Negative for frequency, difficulty urinating and vaginal pain.  Musculoskeletal: Positive for arthralgias and gait problem. Negative for back pain.  Skin: Negative for pallor and rash.  Neurological: Negative for dizziness, tremors, weakness, numbness and headaches.  Psychiatric/Behavioral: Negative for confusion and sleep disturbance. The patient is not nervous/anxious.        Objective:   Physical Exam  Constitutional: She appears well-developed. No distress.       obese  HENT:  Head: Normocephalic.  Right Ear: External ear normal.  Left Ear: External ear normal.  Nose: Nose normal.  Mouth/Throat: Oropharynx is clear and moist.  Eyes: Conjunctivae are normal. Pupils are equal, round, and reactive to light. Right eye exhibits no discharge. Left eye exhibits no discharge.  Neck: Normal range of motion. Neck supple. No JVD present. No tracheal deviation present. No thyromegaly present.  Cardiovascular: Normal rate,  regular rhythm and normal heart sounds.   Pulmonary/Chest: No stridor. No respiratory distress. She has no wheezes.  Abdominal: Soft. Bowel sounds are normal. She exhibits no distension and no mass. There is no tenderness. There is no rebound and no guarding.  Musculoskeletal: She exhibits tenderness. She exhibits no edema.       Using  A cane  Lymphadenopathy:    She has no cervical adenopathy.  Neurological: She displays normal reflexes. No cranial nerve deficit. She exhibits normal muscle tone. Coordination abnormal.  Skin: No rash noted. No erythema.  Psychiatric: She has a normal mood and affect. Her behavior is normal. Judgment and thought content normal.      Lab Results  Component Value Date   WBC 5.6 05/04/2011   HGB 11.8* 05/04/2011   HCT 37.3 05/04/2011   PLT 196.0 05/04/2011   GLUCOSE 153* 08/27/2011   CHOL 198 05/04/2011   TRIG 116.0 05/04/2011   HDL 62.30 05/04/2011   LDLDIRECT 126.1 03/31/2010   LDLCALC 113* 05/04/2011   ALT 15 05/04/2011   AST 18 05/04/2011   NA 141 08/27/2011   K 3.6 08/27/2011   CL 104 08/27/2011   CREATININE 0.8 08/27/2011   BUN 17 08/27/2011   CO2 28 08/27/2011   TSH 3.58 05/04/2011   INR 2.2* 10/25/2007   HGBA1C 6.5 08/27/2011   MICROALBUR 26.2* 06/30/2010       Assessment & Plan:

## 2011-09-01 NOTE — Assessment & Plan Note (Signed)
Discussed.

## 2011-09-01 NOTE — Assessment & Plan Note (Signed)
B knees Chronic, severe  Potential benefits of a long term opioids use as well as potential risks (i.e. addiction risk, apnea etc) and complications (i.e. Somnolence, constipation and others) were explained to the patient and were aknowledged. Continue with current prescription therapy as reflected on the Med list.

## 2011-09-01 NOTE — Assessment & Plan Note (Signed)
Continue with current prescription therapy as reflected on the Med list.  

## 2011-09-15 ENCOUNTER — Ambulatory Visit (INDEPENDENT_AMBULATORY_CARE_PROVIDER_SITE_OTHER): Payer: Medicare PPO | Admitting: Ophthalmology

## 2011-10-07 ENCOUNTER — Telehealth: Payer: Self-pay

## 2011-10-07 MED ORDER — ALLOPURINOL 300 MG PO TABS: 300.0000 mg | ORAL_TABLET | Freq: Every day | ORAL | Status: DC

## 2011-10-07 MED ORDER — POTASSIUM CHLORIDE ER 10 MEQ PO TBCR: 10.0000 meq | EXTENDED_RELEASE_TABLET | Freq: Every day | ORAL | Status: DC

## 2011-10-07 MED ORDER — GLIMEPIRIDE 4 MG PO TABS: 4.0000 mg | ORAL_TABLET | Freq: Two times a day (BID) | ORAL | Status: DC

## 2011-10-07 MED ORDER — LOSARTAN POTASSIUM 50 MG PO TABS: 50.0000 mg | ORAL_TABLET | Freq: Every day | ORAL | Status: DC

## 2011-10-07 MED ORDER — VERAPAMIL HCL ER 360 MG PO CP24: 360.0000 mg | ORAL_CAPSULE | Freq: Every day | ORAL | Status: DC

## 2011-10-07 NOTE — Telephone Encounter (Signed)
Pt called requesting refills of her medication to mail order pharmacy.

## 2011-12-30 ENCOUNTER — Other Ambulatory Visit (INDEPENDENT_AMBULATORY_CARE_PROVIDER_SITE_OTHER): Payer: Medicare PPO

## 2011-12-30 DIAGNOSIS — E119 Type 2 diabetes mellitus without complications: Secondary | ICD-10-CM

## 2011-12-30 DIAGNOSIS — I10 Essential (primary) hypertension: Secondary | ICD-10-CM

## 2011-12-30 DIAGNOSIS — M1990 Unspecified osteoarthritis, unspecified site: Secondary | ICD-10-CM

## 2011-12-30 DIAGNOSIS — E538 Deficiency of other specified B group vitamins: Secondary | ICD-10-CM

## 2011-12-30 LAB — BASIC METABOLIC PANEL
BUN: 11 mg/dL (ref 6–23)
CO2: 29 mEq/L (ref 19–32)
Chloride: 104 mEq/L (ref 96–112)
Potassium: 4 mEq/L (ref 3.5–5.1)

## 2011-12-30 LAB — HEMOGLOBIN A1C: Hgb A1c MFr Bld: 6.1 % (ref 4.6–6.5)

## 2011-12-30 LAB — VITAMIN B12: Vitamin B-12: 1199 pg/mL — ABNORMAL HIGH (ref 211–911)

## 2011-12-31 ENCOUNTER — Other Ambulatory Visit: Payer: Self-pay | Admitting: Internal Medicine

## 2012-01-05 ENCOUNTER — Encounter: Payer: Self-pay | Admitting: Internal Medicine

## 2012-01-05 ENCOUNTER — Ambulatory Visit (INDEPENDENT_AMBULATORY_CARE_PROVIDER_SITE_OTHER): Payer: Medicare PPO | Admitting: Internal Medicine

## 2012-01-05 VITALS — BP 140/72 | HR 84 | Temp 98.7°F | Resp 16 | Wt 249.0 lb

## 2012-01-05 DIAGNOSIS — E119 Type 2 diabetes mellitus without complications: Secondary | ICD-10-CM

## 2012-01-05 DIAGNOSIS — H5461 Unqualified visual loss, right eye, normal vision left eye: Secondary | ICD-10-CM | POA: Insufficient documentation

## 2012-01-05 DIAGNOSIS — H5460 Unqualified visual loss, one eye, unspecified: Secondary | ICD-10-CM

## 2012-01-05 DIAGNOSIS — M1990 Unspecified osteoarthritis, unspecified site: Secondary | ICD-10-CM

## 2012-01-05 DIAGNOSIS — E538 Deficiency of other specified B group vitamins: Secondary | ICD-10-CM

## 2012-01-05 DIAGNOSIS — I10 Essential (primary) hypertension: Secondary | ICD-10-CM

## 2012-01-05 MED ORDER — HYDROXYZINE HCL 10 MG PO TABS: 10.0000 mg | ORAL_TABLET | Freq: Three times a day (TID) | ORAL | Status: DC | PRN

## 2012-01-05 MED ORDER — HYDROCODONE-ACETAMINOPHEN 7.5-325 MG PO TABS: 1.0000 | ORAL_TABLET | Freq: Four times a day (QID) | ORAL | Status: DC | PRN

## 2012-01-05 NOTE — Progress Notes (Signed)
   Subjective:    Patient ID: Holly Banks, female    DOB: 01/22/1929, 78 y.o.   MRN: [id number]  HPI The patient presents for a follow-up of  chronic hypertension, chronic dyslipidemia, type 2 diabetes controlled with medicines. C/o skin itching at times...  C/o L knee pain is worse  Wt Readings from Last 3 Encounters:  01/05/12 249 lb (112.946 kg)  09/01/11 252 lb (114.306 kg)  05/11/11 254 lb (115.214 kg)   BP Readings from Last 3 Encounters:  01/05/12 140/72  09/01/11 150/88  05/11/11 140/76       Review of Systems  Constitutional: Negative for chills, activity change, appetite change, fatigue and unexpected weight change.  HENT: Negative for congestion, mouth sores and sinus pressure.   Eyes: Negative for visual disturbance.  Respiratory: Negative for cough and chest tightness.   Gastrointestinal: Negative for nausea and abdominal pain.  Genitourinary: Negative for frequency, difficulty urinating and vaginal pain.  Musculoskeletal: Positive for arthralgias and gait problem. Negative for back pain.  Skin: Negative for pallor and rash.  Neurological: Negative for dizziness, tremors, weakness and headaches.  Psychiatric/Behavioral: Negative for confusion and disturbed wake/sleep cycle. The patient is not nervous/anxious.        Objective:   Physical Exam  Constitutional: She appears well-developed. No distress.       obese  HENT:  Head: Normocephalic.  Right Ear: External ear normal.  Left Ear: External ear normal.  Nose: Nose normal.  Mouth/Throat: Oropharynx is clear and moist.  Eyes: Conjunctivae are normal. Pupils are equal, round, and reactive to light. Right eye exhibits no discharge. Left eye exhibits no discharge.  Neck: Normal range of motion. Neck supple. No JVD present. No tracheal deviation present. No thyromegaly present.  Cardiovascular: Normal rate, regular rhythm and normal heart sounds.   Pulmonary/Chest: No stridor. No respiratory distress. She  has no wheezes.  Abdominal: Soft. Bowel sounds are normal. She exhibits no distension and no mass. There is no tenderness. There is no rebound and no guarding.  Musculoskeletal: She exhibits tenderness. She exhibits no edema.       Using  A cane  Lymphadenopathy:    She has no cervical adenopathy.  Neurological: She displays normal reflexes. No cranial nerve deficit. She exhibits normal muscle tone. Coordination abnormal.  Skin: No rash noted. No erythema.  Psychiatric: She has a normal mood and affect. Her behavior is normal. Judgment and thought content normal.      Lab Results  Component Value Date   WBC 5.6 05/04/2011   HGB 11.8* 05/04/2011   HCT 37.3 05/04/2011   PLT 196.0 05/04/2011   GLUCOSE 157* 12/30/2011   CHOL 198 05/04/2011   TRIG 116.0 05/04/2011   HDL 62.30 05/04/2011   LDLDIRECT 126.1 03/31/2010   LDLCALC 113* 05/04/2011   ALT 15 05/04/2011   AST 18 05/04/2011   NA 140 12/30/2011   K 4.0 12/30/2011   CL 104 12/30/2011   CREATININE 0.7 12/30/2011   BUN 11 12/30/2011   CO2 29 12/30/2011   TSH 3.58 05/04/2011   INR 2.2* 10/25/2007   HGBA1C 6.1 12/30/2011   MICROALBUR 26.2* 06/30/2010       Assessment & Plan:

## 2012-01-05 NOTE — Assessment & Plan Note (Signed)
Discussed.

## 2012-01-05 NOTE — Assessment & Plan Note (Signed)
Continue with current prescription therapy as reflected on the Med list.  

## 2012-01-05 NOTE — Assessment & Plan Note (Signed)
No chief complaint on file.

## 2012-01-05 NOTE — Assessment & Plan Note (Signed)
  On diet  

## 2012-04-03 ENCOUNTER — Other Ambulatory Visit (INDEPENDENT_AMBULATORY_CARE_PROVIDER_SITE_OTHER): Payer: Medicare PPO

## 2012-04-03 DIAGNOSIS — E119 Type 2 diabetes mellitus without complications: Secondary | ICD-10-CM

## 2012-04-03 LAB — BASIC METABOLIC PANEL
CO2: 28 mEq/L (ref 19–32)
Chloride: 105 mEq/L (ref 96–112)
Sodium: 141 mEq/L (ref 135–145)

## 2012-04-10 ENCOUNTER — Encounter: Payer: Self-pay | Admitting: Internal Medicine

## 2012-04-10 ENCOUNTER — Ambulatory Visit (INDEPENDENT_AMBULATORY_CARE_PROVIDER_SITE_OTHER): Payer: Medicare PPO | Admitting: Internal Medicine

## 2012-04-10 VITALS — BP 140/70 | HR 80 | Temp 99.1°F | Resp 16 | Wt 251.0 lb

## 2012-04-10 DIAGNOSIS — I10 Essential (primary) hypertension: Secondary | ICD-10-CM

## 2012-04-10 DIAGNOSIS — Z23 Encounter for immunization: Secondary | ICD-10-CM

## 2012-04-10 DIAGNOSIS — E538 Deficiency of other specified B group vitamins: Secondary | ICD-10-CM

## 2012-04-10 DIAGNOSIS — E119 Type 2 diabetes mellitus without complications: Secondary | ICD-10-CM

## 2012-04-10 DIAGNOSIS — M1990 Unspecified osteoarthritis, unspecified site: Secondary | ICD-10-CM

## 2012-04-10 MED ORDER — LOSARTAN POTASSIUM 50 MG PO TABS: 50.0000 mg | ORAL_TABLET | Freq: Every day | ORAL | Status: DC

## 2012-04-10 MED ORDER — VERAPAMIL HCL ER 360 MG PO CP24: 360.0000 mg | ORAL_CAPSULE | Freq: Every day | ORAL | Status: DC

## 2012-04-10 MED ORDER — METFORMIN HCL 500 MG PO TABS: 500.0000 mg | ORAL_TABLET | Freq: Two times a day (BID) | ORAL | Status: DC

## 2012-04-10 MED ORDER — POTASSIUM CHLORIDE ER 10 MEQ PO TBCR: 10.0000 meq | EXTENDED_RELEASE_TABLET | Freq: Every day | ORAL | Status: DC

## 2012-04-10 MED ORDER — ALLOPURINOL 300 MG PO TABS: 300.0000 mg | ORAL_TABLET | Freq: Every day | ORAL | Status: DC

## 2012-04-10 MED ORDER — GLIMEPIRIDE 4 MG PO TABS: 4.0000 mg | ORAL_TABLET | Freq: Two times a day (BID) | ORAL | Status: DC

## 2012-04-10 NOTE — Assessment & Plan Note (Signed)
Continue with current prescription therapy as reflected on the Med list.  

## 2012-04-10 NOTE — Assessment & Plan Note (Signed)
Wt Readings from Last 3 Encounters:  04/10/12 251 lb (113.853 kg)  01/05/12 249 lb (112.946 kg)  09/01/11 252 lb (114.306 kg)  discussed diet

## 2012-04-10 NOTE — Progress Notes (Signed)
   Subjective:    Patient ID: Holly Banks, female    DOB: 07/18/1928, 78 y.o.   MRN: [id number]  HPI The patient presents for a follow-up of  chronic hypertension, chronic dyslipidemia, type 2 diabetes controlled with medicines. C/o skin itching at times...  C/o L knee pain off and on  Wt Readings from Last 3 Encounters:  04/10/12 251 lb (113.853 kg)  01/05/12 249 lb (112.946 kg)  09/01/11 252 lb (114.306 kg)   BP Readings from Last 3 Encounters:  04/10/12 140/70  01/05/12 140/72  09/01/11 150/88       Review of Systems  Constitutional: Negative for chills, activity change, appetite change, fatigue and unexpected weight change.  HENT: Negative for congestion, mouth sores and sinus pressure.   Eyes: Negative for visual disturbance.  Respiratory: Negative for cough and chest tightness.   Gastrointestinal: Negative for nausea and abdominal pain.  Genitourinary: Negative for frequency, difficulty urinating and vaginal pain.  Musculoskeletal: Positive for arthralgias and gait problem. Negative for back pain.  Skin: Negative for pallor and rash.  Neurological: Negative for dizziness, tremors, weakness and headaches.  Psychiatric/Behavioral: Negative for confusion and sleep disturbance. The patient is not nervous/anxious.        Objective:   Physical Exam  Constitutional: She appears well-developed. No distress.       obese  HENT:  Head: Normocephalic.  Right Ear: External ear normal.  Left Ear: External ear normal.  Nose: Nose normal.  Mouth/Throat: Oropharynx is clear and moist.  Eyes: Conjunctivae normal are normal. Pupils are equal, round, and reactive to light. Right eye exhibits no discharge. Left eye exhibits no discharge.  Neck: Normal range of motion. Neck supple. No JVD present. No tracheal deviation present. No thyromegaly present.  Cardiovascular: Normal rate, regular rhythm and normal heart sounds.   Pulmonary/Chest: No stridor. No respiratory distress. She  has no wheezes.  Abdominal: Soft. Bowel sounds are normal. She exhibits no distension and no mass. There is no tenderness. There is no rebound and no guarding.  Musculoskeletal: She exhibits tenderness. She exhibits no edema.       Using  A cane  Lymphadenopathy:    She has no cervical adenopathy.  Neurological: She displays normal reflexes. No cranial nerve deficit. She exhibits normal muscle tone. Coordination abnormal.  Skin: No rash noted. No erythema.  Psychiatric: She has a normal mood and affect. Her behavior is normal. Judgment and thought content normal.      Lab Results  Component Value Date   WBC 5.6 05/04/2011   HGB 11.8* 05/04/2011   HCT 37.3 05/04/2011   PLT 196.0 05/04/2011   GLUCOSE 158* 04/03/2012   CHOL 198 05/04/2011   TRIG 116.0 05/04/2011   HDL 62.30 05/04/2011   LDLDIRECT 126.1 03/31/2010   LDLCALC 113* 05/04/2011   ALT 15 05/04/2011   AST 18 05/04/2011   NA 141 04/03/2012   K 3.6 04/03/2012   CL 105 04/03/2012   CREATININE 0.6 04/03/2012   BUN 15 04/03/2012   CO2 28 04/03/2012   TSH 3.58 05/04/2011   INR 2.2* 10/25/2007   HGBA1C 6.2 04/03/2012   MICROALBUR 26.2* 06/30/2010       Assessment & Plan:

## 2012-08-01 ENCOUNTER — Other Ambulatory Visit (INDEPENDENT_AMBULATORY_CARE_PROVIDER_SITE_OTHER): Payer: Medicare PPO

## 2012-08-01 DIAGNOSIS — I10 Essential (primary) hypertension: Secondary | ICD-10-CM

## 2012-08-01 DIAGNOSIS — M1990 Unspecified osteoarthritis, unspecified site: Secondary | ICD-10-CM

## 2012-08-01 DIAGNOSIS — E538 Deficiency of other specified B group vitamins: Secondary | ICD-10-CM

## 2012-08-01 DIAGNOSIS — E119 Type 2 diabetes mellitus without complications: Secondary | ICD-10-CM

## 2012-08-01 LAB — TSH: TSH: 2.92 u[IU]/mL (ref 0.35–5.50)

## 2012-08-01 LAB — HEMOGLOBIN A1C: Hgb A1c MFr Bld: 6.3 % (ref 4.6–6.5)

## 2012-08-01 LAB — BASIC METABOLIC PANEL
Calcium: 9.7 mg/dL (ref 8.4–10.5)
GFR: 83.48 mL/min (ref >60.00)
Potassium: 4.2 mEq/L (ref 3.5–5.1)
Sodium: 141 mEq/L (ref 135–145)

## 2012-08-01 LAB — VITAMIN B12: Vitamin B-12: 1057 pg/mL — ABNORMAL HIGH (ref 211–911)

## 2012-08-09 ENCOUNTER — Ambulatory Visit (INDEPENDENT_AMBULATORY_CARE_PROVIDER_SITE_OTHER): Payer: Medicare PPO | Admitting: Internal Medicine

## 2012-08-09 ENCOUNTER — Encounter: Payer: Self-pay | Admitting: Internal Medicine

## 2012-08-09 VITALS — BP 142/90 | HR 76 | Temp 98.8°F | Resp 16 | Wt 248.0 lb

## 2012-08-09 DIAGNOSIS — M109 Gout, unspecified: Secondary | ICD-10-CM

## 2012-08-09 DIAGNOSIS — E538 Deficiency of other specified B group vitamins: Secondary | ICD-10-CM

## 2012-08-09 DIAGNOSIS — E875 Hyperkalemia: Secondary | ICD-10-CM

## 2012-08-09 DIAGNOSIS — E559 Vitamin D deficiency, unspecified: Secondary | ICD-10-CM

## 2012-08-09 DIAGNOSIS — E119 Type 2 diabetes mellitus without complications: Secondary | ICD-10-CM

## 2012-08-09 NOTE — Assessment & Plan Note (Signed)
No relapse 

## 2012-08-09 NOTE — Assessment & Plan Note (Signed)
Continue with current  therapy as reflected on the Med list.  

## 2012-08-09 NOTE — Assessment & Plan Note (Signed)
Continue with current prescription therapy as reflected on the Med list.  

## 2012-08-09 NOTE — Assessment & Plan Note (Signed)
Doing fair 

## 2012-08-09 NOTE — Assessment & Plan Note (Signed)
Wt Readings from Last 3 Encounters:  08/09/12 248 lb (112.492 kg)  04/10/12 251 lb (113.853 kg)  01/05/12 249 lb (112.946 kg)

## 2012-08-09 NOTE — Assessment & Plan Note (Signed)
Continue with current prescription therapy as reflected on the Med list. Labs  

## 2012-08-09 NOTE — Progress Notes (Signed)
   Subjective:     HPI The patient presents for a follow-up of  chronic hypertension, chronic dyslipidemia, type 2 diabetes controlled with medicines.   C/o L>R knee pain off and on  Wt Readings from Last 3 Encounters:  08/09/12 248 lb (112.492 kg)  04/10/12 251 lb (113.853 kg)  01/05/12 249 lb (112.946 kg)   BP Readings from Last 3 Encounters:  08/09/12 142/90  04/10/12 140/70  01/05/12 140/72       Review of Systems  Constitutional: Negative for chills, activity change, appetite change, fatigue and unexpected weight change.  HENT: Negative for congestion, mouth sores and sinus pressure.   Eyes: Negative for visual disturbance.  Respiratory: Negative for cough and chest tightness.   Gastrointestinal: Negative for nausea and abdominal pain.  Genitourinary: Negative for frequency, difficulty urinating and vaginal pain.  Musculoskeletal: Positive for arthralgias and gait problem. Negative for back pain.  Skin: Negative for pallor and rash.  Neurological: Negative for dizziness, tremors, weakness and headaches.  Psychiatric/Behavioral: Negative for confusion and sleep disturbance. The patient is not nervous/anxious.        Objective:   Physical Exam  Constitutional: She appears well-developed. No distress.  obese  HENT:  Head: Normocephalic.  Right Ear: External ear normal.  Left Ear: External ear normal.  Nose: Nose normal.  Mouth/Throat: Oropharynx is clear and moist.  Eyes: Conjunctivae are normal. Pupils are equal, round, and reactive to light. Right eye exhibits no discharge. Left eye exhibits no discharge.  Neck: Normal range of motion. Neck supple. No JVD present. No tracheal deviation present. No thyromegaly present.  Cardiovascular: Normal rate, regular rhythm and normal heart sounds.   Pulmonary/Chest: No stridor. No respiratory distress. She has no wheezes.  Abdominal: Soft. Bowel sounds are normal. She exhibits no distension and no mass. There is no  tenderness. There is no rebound and no guarding.  Musculoskeletal: She exhibits tenderness. She exhibits no edema.  Using  A cane  Lymphadenopathy:    She has no cervical adenopathy.  Neurological: She displays normal reflexes. No cranial nerve deficit. She exhibits normal muscle tone. Coordination abnormal.  Skin: No rash noted. No erythema.  Psychiatric: She has a normal mood and affect. Her behavior is normal. Judgment and thought content normal.      Lab Results  Component Value Date   WBC 5.6 05/04/2011   HGB 11.8* 05/04/2011   HCT 37.3 05/04/2011   PLT 196.0 05/04/2011   GLUCOSE 149* 08/01/2012   CHOL 198 05/04/2011   TRIG 116.0 05/04/2011   HDL 62.30 05/04/2011   LDLDIRECT 126.1 03/31/2010   LDLCALC 113* 05/04/2011   ALT 15 05/04/2011   AST 18 05/04/2011   NA 141 08/01/2012   K 4.2 08/01/2012   CL 105 08/01/2012   CREATININE 0.8 08/01/2012   BUN 22 08/01/2012   CO2 27 08/01/2012   TSH 2.92 08/01/2012   INR 2.2* 10/25/2007   HGBA1C 6.3 08/01/2012   MICROALBUR 26.2* 06/30/2010       Assessment & Plan:

## 2012-12-06 ENCOUNTER — Other Ambulatory Visit (INDEPENDENT_AMBULATORY_CARE_PROVIDER_SITE_OTHER): Payer: Medicare PPO

## 2012-12-06 DIAGNOSIS — E559 Vitamin D deficiency, unspecified: Secondary | ICD-10-CM

## 2012-12-06 DIAGNOSIS — E119 Type 2 diabetes mellitus without complications: Secondary | ICD-10-CM

## 2012-12-06 DIAGNOSIS — M109 Gout, unspecified: Secondary | ICD-10-CM

## 2012-12-06 DIAGNOSIS — E875 Hyperkalemia: Secondary | ICD-10-CM

## 2012-12-06 DIAGNOSIS — E538 Deficiency of other specified B group vitamins: Secondary | ICD-10-CM

## 2012-12-06 LAB — BASIC METABOLIC PANEL
CO2: 25 mEq/L (ref 19–32)
Calcium: 9.9 mg/dL (ref 8.4–10.5)
Potassium: 4.4 mEq/L (ref 3.5–5.1)
Sodium: 142 mEq/L (ref 135–145)

## 2012-12-14 ENCOUNTER — Encounter: Payer: Self-pay | Admitting: Internal Medicine

## 2012-12-14 ENCOUNTER — Other Ambulatory Visit (INDEPENDENT_AMBULATORY_CARE_PROVIDER_SITE_OTHER): Payer: Medicare PPO

## 2012-12-14 ENCOUNTER — Ambulatory Visit (INDEPENDENT_AMBULATORY_CARE_PROVIDER_SITE_OTHER): Payer: Medicare PPO | Admitting: Internal Medicine

## 2012-12-14 VITALS — BP 140/76 | HR 72 | Temp 98.7°F | Resp 16 | Wt 244.0 lb

## 2012-12-14 DIAGNOSIS — E119 Type 2 diabetes mellitus without complications: Secondary | ICD-10-CM

## 2012-12-14 DIAGNOSIS — I10 Essential (primary) hypertension: Secondary | ICD-10-CM

## 2012-12-14 DIAGNOSIS — R32 Unspecified urinary incontinence: Secondary | ICD-10-CM

## 2012-12-14 DIAGNOSIS — R609 Edema, unspecified: Secondary | ICD-10-CM

## 2012-12-14 DIAGNOSIS — E538 Deficiency of other specified B group vitamins: Secondary | ICD-10-CM

## 2012-12-14 LAB — URINALYSIS, ROUTINE W REFLEX MICROSCOPIC
Hgb urine dipstick: NEGATIVE
Nitrite: NEGATIVE
Urobilinogen, UA: 0.2 (ref 0.0–1.0)

## 2012-12-14 MED ORDER — DARIFENACIN HYDROBROMIDE ER 7.5 MG PO TB24: 7.5000 mg | ORAL_TABLET | Freq: Every day | ORAL | Status: DC

## 2012-12-14 MED ORDER — HYDROCODONE-ACETAMINOPHEN 7.5-325 MG PO TABS: 0.5000 | ORAL_TABLET | Freq: Three times a day (TID) | ORAL | Status: DC | PRN

## 2012-12-14 NOTE — Progress Notes (Signed)
Patient ID: Holly Banks, female   DOB: 1928/10/14, 79 y.o.   MRN: [id number]   Subjective:     HPI The patient presents for a follow-up of  chronic hypertension, chronic dyslipidemia, type 2 diabetes controlled with medicines. C/o urinary problems - worse...  F/u L>R knee pain off and on - stable  Wt Readings from Last 3 Encounters:  12/14/12 244 lb (110.678 kg)  08/09/12 248 lb (112.492 kg)  04/10/12 251 lb (113.853 kg)   BP Readings from Last 3 Encounters:  12/14/12 140/76  08/09/12 142/90  04/10/12 140/70       Review of Systems  Constitutional: Negative for chills, activity change, appetite change, fatigue and unexpected weight change.  HENT: Negative for congestion, mouth sores and sinus pressure.   Eyes: Negative for visual disturbance.  Respiratory: Negative for cough and chest tightness.   Gastrointestinal: Negative for nausea and abdominal pain.  Genitourinary: Negative for frequency, difficulty urinating and vaginal pain.  Musculoskeletal: Positive for arthralgias and gait problem. Negative for back pain.  Skin: Negative for pallor and rash.  Neurological: Negative for dizziness, tremors, weakness and headaches.  Psychiatric/Behavioral: Negative for confusion and sleep disturbance. The patient is not nervous/anxious.        Objective:   Physical Exam  Constitutional: She appears well-developed. No distress.  obese  HENT:  Head: Normocephalic.  Right Ear: External ear normal.  Left Ear: External ear normal.  Nose: Nose normal.  Mouth/Throat: Oropharynx is clear and moist.  Eyes: Conjunctivae are normal. Pupils are equal, round, and reactive to light. Right eye exhibits no discharge. Left eye exhibits no discharge.  Neck: Normal range of motion. Neck supple. No JVD present. No tracheal deviation present. No thyromegaly present.  Cardiovascular: Normal rate, regular rhythm and normal heart sounds.   Pulmonary/Chest: No stridor. No respiratory distress.  She has no wheezes.  Abdominal: Soft. Bowel sounds are normal. She exhibits no distension and no mass. There is no tenderness. There is no rebound and no guarding.  Musculoskeletal: She exhibits tenderness. She exhibits no edema.  Using  A cane  Lymphadenopathy:    She has no cervical adenopathy.  Neurological: She displays normal reflexes. No cranial nerve deficit. She exhibits normal muscle tone. Coordination abnormal.  Skin: No rash noted. No erythema.  Psychiatric: She has a normal mood and affect. Her behavior is normal. Judgment and thought content normal.   Cane   Lab Results  Component Value Date   WBC 5.6 05/04/2011   HGB 11.8* 05/04/2011   HCT 37.3 05/04/2011   PLT 196.0 05/04/2011   GLUCOSE 173* 12/06/2012   CHOL 198 05/04/2011   TRIG 116.0 05/04/2011   HDL 62.30 05/04/2011   LDLDIRECT 126.1 03/31/2010   LDLCALC 113* 05/04/2011   ALT 15 05/04/2011   AST 18 05/04/2011   NA 142 12/06/2012   K 4.4 12/06/2012   CL 105 12/06/2012   CREATININE 0.7 12/06/2012   BUN 14 12/06/2012   CO2 25 12/06/2012   TSH 2.92 08/01/2012   INR 2.2* 10/25/2007   HGBA1C 6.5 12/06/2012   MICROALBUR 26.2* 06/30/2010       Assessment & Plan:

## 2012-12-14 NOTE — Assessment & Plan Note (Signed)
Worse UA Try enablex

## 2012-12-14 NOTE — Assessment & Plan Note (Signed)
Continue with current prescription therapy as reflected on the Med list.  

## 2012-12-14 NOTE — Assessment & Plan Note (Signed)
Wt Readings from Last 3 Encounters:  12/14/12 244 lb (110.678 kg)  08/09/12 248 lb (112.492 kg)  04/10/12 251 lb (113.853 kg)

## 2012-12-16 ENCOUNTER — Other Ambulatory Visit: Payer: Self-pay | Admitting: Internal Medicine

## 2012-12-16 MED ORDER — CIPROFLOXACIN HCL 250 MG PO TABS: 250.0000 mg | ORAL_TABLET | Freq: Two times a day (BID) | ORAL | Status: DC

## 2012-12-18 ENCOUNTER — Other Ambulatory Visit: Payer: Self-pay | Admitting: *Deleted

## 2012-12-18 MED ORDER — CIPROFLOXACIN HCL 250 MG PO TABS: 250.0000 mg | ORAL_TABLET | Freq: Two times a day (BID) | ORAL | Status: DC

## 2013-01-04 ENCOUNTER — Other Ambulatory Visit: Payer: Self-pay | Admitting: Internal Medicine

## 2013-03-12 ENCOUNTER — Other Ambulatory Visit: Payer: Self-pay | Admitting: *Deleted

## 2013-03-12 ENCOUNTER — Other Ambulatory Visit (INDEPENDENT_AMBULATORY_CARE_PROVIDER_SITE_OTHER): Payer: Medicare PPO

## 2013-03-12 DIAGNOSIS — E538 Deficiency of other specified B group vitamins: Secondary | ICD-10-CM

## 2013-03-12 DIAGNOSIS — E119 Type 2 diabetes mellitus without complications: Secondary | ICD-10-CM

## 2013-03-12 DIAGNOSIS — I10 Essential (primary) hypertension: Secondary | ICD-10-CM

## 2013-03-12 LAB — BASIC METABOLIC PANEL
BUN: 11 mg/dL (ref 6–23)
Calcium: 9.5 mg/dL (ref 8.4–10.5)
GFR: 106.37 mL/min (ref >60.00)
Glucose, Bld: 140 mg/dL — ABNORMAL HIGH (ref 70–99)
Sodium: 139 mEq/L (ref 135–145)

## 2013-03-15 ENCOUNTER — Ambulatory Visit (INDEPENDENT_AMBULATORY_CARE_PROVIDER_SITE_OTHER): Payer: Medicare PPO | Admitting: Internal Medicine

## 2013-03-15 ENCOUNTER — Encounter: Payer: Self-pay | Admitting: Internal Medicine

## 2013-03-15 VITALS — BP 146/78 | HR 72 | Temp 97.8°F | Resp 16 | Wt 241.0 lb

## 2013-03-15 DIAGNOSIS — M1990 Unspecified osteoarthritis, unspecified site: Secondary | ICD-10-CM

## 2013-03-15 DIAGNOSIS — R059 Cough, unspecified: Secondary | ICD-10-CM

## 2013-03-15 DIAGNOSIS — R05 Cough: Secondary | ICD-10-CM

## 2013-03-15 DIAGNOSIS — E119 Type 2 diabetes mellitus without complications: Secondary | ICD-10-CM

## 2013-03-15 DIAGNOSIS — E538 Deficiency of other specified B group vitamins: Secondary | ICD-10-CM

## 2013-03-15 DIAGNOSIS — Z23 Encounter for immunization: Secondary | ICD-10-CM

## 2013-03-15 MED ORDER — HYDROCODONE-ACETAMINOPHEN 7.5-325 MG PO TABS: 0.5000 | ORAL_TABLET | Freq: Three times a day (TID) | ORAL | Status: DC | PRN

## 2013-03-15 MED ORDER — HYDROXYZINE HCL 10 MG PO TABS: ORAL_TABLET | ORAL | Status: DC

## 2013-03-15 NOTE — Progress Notes (Signed)
,     Subjective:     HPI The patient presents for a follow-up of  chronic hypertension, chronic dyslipidemia, type 2 diabetes controlled with medicines. C/o cough x 3-4 wks  F/u L>R knee pain off and on - stable  Wt Readings from Last 3 Encounters:  03/15/13 241 lb (109.317 kg)  12/14/12 244 lb (110.678 kg)  08/09/12 248 lb (112.492 kg)   BP Readings from Last 3 Encounters:  03/15/13 146/78  12/14/12 140/76  08/09/12 142/90       Review of Systems  Constitutional: Negative for chills, activity change, appetite change, fatigue and unexpected weight change.  HENT: Negative for congestion, mouth sores and sinus pressure.   Eyes: Negative for visual disturbance.  Respiratory: Negative for cough and chest tightness.   Gastrointestinal: Negative for nausea and abdominal pain.  Genitourinary: Negative for frequency, difficulty urinating and vaginal pain.  Musculoskeletal: Positive for arthralgias and gait problem. Negative for back pain.  Skin: Negative for pallor and rash.  Neurological: Negative for dizziness, tremors, weakness and headaches.  Psychiatric/Behavioral: Negative for confusion and sleep disturbance. The patient is not nervous/anxious.        Objective:   Physical Exam  Constitutional: She appears well-developed. No distress.  obese  HENT:  Head: Normocephalic.  Right Ear: External ear normal.  Left Ear: External ear normal.  Nose: Nose normal.  Mouth/Throat: Oropharynx is clear and moist.  Eyes: Conjunctivae are normal. Pupils are equal, round, and reactive to light. Right eye exhibits no discharge. Left eye exhibits no discharge.  Neck: Normal range of motion. Neck supple. No JVD present. No tracheal deviation present. No thyromegaly present.  Cardiovascular: Normal rate, regular rhythm and normal heart sounds.   Pulmonary/Chest: No stridor. No respiratory distress. She has no wheezes.  Abdominal: Soft. Bowel sounds are normal. She exhibits no distension  and no mass. There is no tenderness. There is no rebound and no guarding.  Musculoskeletal: She exhibits tenderness. She exhibits no edema.  Using  A cane  Lymphadenopathy:    She has no cervical adenopathy.  Neurological: She displays normal reflexes. No cranial nerve deficit. She exhibits normal muscle tone. Coordination abnormal.  Skin: No rash noted. No erythema.  Psychiatric: She has a normal mood and affect. Her behavior is normal. Judgment and thought content normal.   Cane   Lab Results  Component Value Date   WBC 5.6 05/04/2011   HGB 11.8* 05/04/2011   HCT 37.3 05/04/2011   PLT 196.0 05/04/2011   GLUCOSE 140* 03/12/2013   CHOL 198 05/04/2011   TRIG 116.0 05/04/2011   HDL 62.30 05/04/2011   LDLDIRECT 126.1 03/31/2010   LDLCALC 113* 05/04/2011   ALT 15 05/04/2011   AST 18 05/04/2011   NA 139 03/12/2013   K 4.0 03/12/2013   CL 104 03/12/2013   CREATININE 0.7 03/12/2013   BUN 11 03/12/2013   CO2 26 03/12/2013   TSH 2.92 08/01/2012   INR 2.2* 10/25/2007   HGBA1C 6.1 03/12/2013   MICROALBUR 26.2* 06/30/2010       Assessment & Plan:

## 2013-03-15 NOTE — Assessment & Plan Note (Signed)
Wt Readings from Last 3 Encounters:  03/15/13 241 lb (109.317 kg)  12/14/12 244 lb (110.678 kg)  08/09/12 248 lb (112.492 kg)

## 2013-03-15 NOTE — Assessment & Plan Note (Signed)
Continue with current prescription therapy as reflected on the Med list.  

## 2013-03-15 NOTE — Patient Instructions (Signed)
Use over-the-counter  "cold" medicines  such as " Delsym" or" Robitussin" cough syrup varietis for cough.  You can use plain "Tylenol" or "Advil" for fever, chills and achyness. Please, make an appointment if you are not better or if you're worse.

## 2013-03-15 NOTE — Assessment & Plan Note (Signed)
10/14 ?post-URI OTC meds Declined CXR

## 2013-05-09 ENCOUNTER — Other Ambulatory Visit: Payer: Self-pay | Admitting: *Deleted

## 2013-05-09 MED ORDER — POTASSIUM CHLORIDE ER 10 MEQ PO TBCR: 10.0000 meq | EXTENDED_RELEASE_TABLET | Freq: Every day | ORAL | Status: DC

## 2013-05-10 ENCOUNTER — Other Ambulatory Visit: Payer: Self-pay | Admitting: *Deleted

## 2013-05-10 MED ORDER — GLIMEPIRIDE 4 MG PO TABS: 4.0000 mg | ORAL_TABLET | Freq: Two times a day (BID) | ORAL | Status: DC

## 2013-05-10 MED ORDER — METFORMIN HCL 500 MG PO TABS: 500.0000 mg | ORAL_TABLET | Freq: Two times a day (BID) | ORAL | Status: DC

## 2013-05-10 MED ORDER — VERAPAMIL HCL ER 360 MG PO CP24: 360.0000 mg | ORAL_CAPSULE | Freq: Every day | ORAL | Status: DC

## 2013-05-10 MED ORDER — ALLOPURINOL 300 MG PO TABS: 300.0000 mg | ORAL_TABLET | Freq: Every day | ORAL | Status: DC

## 2013-05-10 MED ORDER — LOSARTAN POTASSIUM 50 MG PO TABS: 50.0000 mg | ORAL_TABLET | Freq: Every day | ORAL | Status: DC

## 2013-06-08 ENCOUNTER — Other Ambulatory Visit: Payer: Medicare PPO

## 2013-06-15 ENCOUNTER — Other Ambulatory Visit (INDEPENDENT_AMBULATORY_CARE_PROVIDER_SITE_OTHER): Payer: Medicare PPO

## 2013-06-15 ENCOUNTER — Encounter: Payer: Self-pay | Admitting: Internal Medicine

## 2013-06-15 ENCOUNTER — Ambulatory Visit (INDEPENDENT_AMBULATORY_CARE_PROVIDER_SITE_OTHER): Payer: Medicare PPO | Admitting: Internal Medicine

## 2013-06-15 VITALS — BP 140/78 | HR 80 | Temp 98.1°F | Resp 16 | Wt 239.0 lb

## 2013-06-15 DIAGNOSIS — E119 Type 2 diabetes mellitus without complications: Secondary | ICD-10-CM

## 2013-06-15 DIAGNOSIS — J069 Acute upper respiratory infection, unspecified: Secondary | ICD-10-CM

## 2013-06-15 DIAGNOSIS — E538 Deficiency of other specified B group vitamins: Secondary | ICD-10-CM

## 2013-06-15 DIAGNOSIS — I10 Essential (primary) hypertension: Secondary | ICD-10-CM

## 2013-06-15 DIAGNOSIS — K219 Gastro-esophageal reflux disease without esophagitis: Secondary | ICD-10-CM

## 2013-06-15 LAB — BASIC METABOLIC PANEL
BUN: 14 mg/dL (ref 6–23)
CALCIUM: 9.5 mg/dL (ref 8.4–10.5)
CO2: 26 mEq/L (ref 19–32)
CREATININE: 0.7 mg/dL (ref 0.4–1.2)
Chloride: 107 mEq/L (ref 96–112)
GFR: 97.94 mL/min (ref >60.00)
GLUCOSE: 157 mg/dL — AB (ref 70–99)
Potassium: 3.9 mEq/L (ref 3.5–5.1)
Sodium: 140 mEq/L (ref 135–145)

## 2013-06-15 LAB — HEMOGLOBIN A1C: HEMOGLOBIN A1C: 6.5 % (ref 4.6–6.5)

## 2013-06-15 MED ORDER — PROMETHAZINE-CODEINE 6.25-10 MG/5ML PO SYRP: 5.0000 mL | ORAL_SOLUTION | ORAL | Status: DC | PRN

## 2013-06-15 MED ORDER — AZITHROMYCIN 250 MG PO TABS: ORAL_TABLET | ORAL | Status: DC

## 2013-06-15 NOTE — Progress Notes (Signed)
Pre visit review using our clinic review tool, if applicable. No additional management support is needed unless otherwise documented below in the visit note. 

## 2013-06-15 NOTE — Progress Notes (Signed)
   Subjective:     Cough This is a new problem. The current episode started 1 to 4 weeks ago. The problem has been unchanged. The problem occurs hourly. The cough is productive of purulent sputum. Pertinent negatives include no chills, fever, headaches, postnasal drip or rash. There is no history of asthma or emphysema.    The patient presents for a follow-up of  chronic hypertension, chronic dyslipidemia, type 2 diabetes controlled with medicines. C/o cough x 3-4 wks  F/u L>R knee pain off and on - stable  Wt Readings from Last 3 Encounters:  06/15/13 239 lb (108.41 kg)  03/15/13 241 lb (109.317 kg)  12/14/12 244 lb (110.678 kg)   BP Readings from Last 3 Encounters:  06/15/13 140/78  03/15/13 146/78  12/14/12 140/76       Review of Systems  Constitutional: Negative for fever, chills, activity change, appetite change, fatigue and unexpected weight change.  HENT: Negative for congestion, mouth sores, postnasal drip and sinus pressure.   Eyes: Negative for visual disturbance.  Respiratory: Negative for cough and chest tightness.   Gastrointestinal: Negative for nausea and abdominal pain.  Genitourinary: Negative for frequency, difficulty urinating and vaginal pain.  Musculoskeletal: Positive for arthralgias and gait problem. Negative for back pain.  Skin: Negative for pallor and rash.  Neurological: Negative for dizziness, tremors, weakness and headaches.  Psychiatric/Behavioral: Negative for confusion and sleep disturbance. The patient is not nervous/anxious.        Objective:   Physical Exam  Constitutional: She appears well-developed. No distress.  obese  HENT:  Head: Normocephalic.  Right Ear: External ear normal.  Left Ear: External ear normal.  Nose: Nose normal.  Mouth/Throat: Oropharynx is clear and moist.  Eyes: Conjunctivae are normal. Pupils are equal, round, and reactive to light. Right eye exhibits no discharge. Left eye exhibits no discharge.  Neck:  Normal range of motion. Neck supple. No JVD present. No tracheal deviation present. No thyromegaly present.  Cardiovascular: Normal rate, regular rhythm and normal heart sounds.   Pulmonary/Chest: No stridor. No respiratory distress. She has no wheezes.  Abdominal: Soft. Bowel sounds are normal. She exhibits no distension and no mass. There is no tenderness. There is no rebound and no guarding.  Musculoskeletal: She exhibits tenderness. She exhibits no edema.  Using  A cane  Lymphadenopathy:    She has no cervical adenopathy.  Neurological: She displays normal reflexes. No cranial nerve deficit. She exhibits normal muscle tone. Coordination abnormal.  Skin: No rash noted. No erythema.  Psychiatric: She has a normal mood and affect. Her behavior is normal. Judgment and thought content normal.   Cane   Lab Results  Component Value Date   WBC 5.6 05/04/2011   HGB 11.8* 05/04/2011   HCT 37.3 05/04/2011   PLT 196.0 05/04/2011   GLUCOSE 140* 03/12/2013   CHOL 198 05/04/2011   TRIG 116.0 05/04/2011   HDL 62.30 05/04/2011   LDLDIRECT 126.1 03/31/2010   LDLCALC 113* 05/04/2011   ALT 15 05/04/2011   AST 18 05/04/2011   NA 139 03/12/2013   K 4.0 03/12/2013   CL 104 03/12/2013   CREATININE 0.7 03/12/2013   BUN 11 03/12/2013   CO2 26 03/12/2013   TSH 2.92 08/01/2012   INR 2.2* 10/25/2007   HGBA1C 6.1 03/12/2013   MICROALBUR 26.2* 06/30/2010       Assessment & Plan:

## 2013-06-15 NOTE — Assessment & Plan Note (Signed)
Continue with current prescription therapy as reflected on the Med list.  

## 2013-06-15 NOTE — Assessment & Plan Note (Signed)
Continue with current prescription therapy as reflected on the Med list. Labs  

## 2013-06-15 NOTE — Assessment & Plan Note (Signed)
Continue with current prescription therapy as reflected on the Med list. labs 

## 2013-06-15 NOTE — Assessment & Plan Note (Signed)
Zpac Prom-cod syr 

## 2013-07-11 ENCOUNTER — Emergency Department (HOSPITAL_COMMUNITY): Payer: Medicare PPO

## 2013-07-11 ENCOUNTER — Encounter (HOSPITAL_COMMUNITY): Payer: Self-pay | Admitting: Emergency Medicine

## 2013-07-11 ENCOUNTER — Inpatient Hospital Stay (HOSPITAL_COMMUNITY)
Admission: EM | Admit: 2013-07-11 | Discharge: 2013-07-17 | DRG: 481 | Disposition: A | Discharge status: Skilled Nursing Facility | Payer: Medicare PPO | Attending: Orthopaedic Surgery | Admitting: Orthopaedic Surgery

## 2013-07-11 DIAGNOSIS — Z9089 Acquired absence of other organs: Secondary | ICD-10-CM

## 2013-07-11 DIAGNOSIS — Z833 Family history of diabetes mellitus: Secondary | ICD-10-CM

## 2013-07-11 DIAGNOSIS — S72309A Unspecified fracture of shaft of unspecified femur, initial encounter for closed fracture: Principal | ICD-10-CM | POA: Diagnosis present

## 2013-07-11 DIAGNOSIS — S72453A Displaced supracondylar fracture without intracondylar extension of lower end of unspecified femur, initial encounter for closed fracture: Secondary | ICD-10-CM | POA: Diagnosis present

## 2013-07-11 DIAGNOSIS — E538 Deficiency of other specified B group vitamins: Secondary | ICD-10-CM | POA: Diagnosis present

## 2013-07-11 DIAGNOSIS — K219 Gastro-esophageal reflux disease without esophagitis: Secondary | ICD-10-CM

## 2013-07-11 DIAGNOSIS — Z860100 Personal history of colon polyps, unspecified: Secondary | ICD-10-CM

## 2013-07-11 DIAGNOSIS — S7291XA Unspecified fracture of right femur, initial encounter for closed fracture: Secondary | ICD-10-CM | POA: Diagnosis present

## 2013-07-11 DIAGNOSIS — W010XXA Fall on same level from slipping, tripping and stumbling without subsequent striking against object, initial encounter: Secondary | ICD-10-CM | POA: Diagnosis present

## 2013-07-11 DIAGNOSIS — Z800 Family history of malignant neoplasm of digestive organs: Secondary | ICD-10-CM

## 2013-07-11 DIAGNOSIS — Z79899 Other long term (current) drug therapy: Secondary | ICD-10-CM

## 2013-07-11 DIAGNOSIS — E119 Type 2 diabetes mellitus without complications: Secondary | ICD-10-CM

## 2013-07-11 DIAGNOSIS — Z96649 Presence of unspecified artificial hip joint: Secondary | ICD-10-CM

## 2013-07-11 DIAGNOSIS — W19XXXA Unspecified fall, initial encounter: Secondary | ICD-10-CM

## 2013-07-11 DIAGNOSIS — Z87891 Personal history of nicotine dependence: Secondary | ICD-10-CM

## 2013-07-11 DIAGNOSIS — D259 Leiomyoma of uterus, unspecified: Secondary | ICD-10-CM | POA: Diagnosis present

## 2013-07-11 DIAGNOSIS — Z803 Family history of malignant neoplasm of breast: Secondary | ICD-10-CM

## 2013-07-11 DIAGNOSIS — D638 Anemia in other chronic diseases classified elsewhere: Secondary | ICD-10-CM | POA: Diagnosis present

## 2013-07-11 DIAGNOSIS — M1990 Unspecified osteoarthritis, unspecified site: Secondary | ICD-10-CM | POA: Diagnosis present

## 2013-07-11 DIAGNOSIS — Z880 Allergy status to penicillin: Secondary | ICD-10-CM

## 2013-07-11 DIAGNOSIS — N390 Urinary tract infection, site not specified: Secondary | ICD-10-CM | POA: Diagnosis present

## 2013-07-11 DIAGNOSIS — Z7982 Long term (current) use of aspirin: Secondary | ICD-10-CM

## 2013-07-11 DIAGNOSIS — Y92009 Unspecified place in unspecified non-institutional (private) residence as the place of occurrence of the external cause: Secondary | ICD-10-CM

## 2013-07-11 DIAGNOSIS — K5730 Diverticulosis of large intestine without perforation or abscess without bleeding: Secondary | ICD-10-CM | POA: Diagnosis present

## 2013-07-11 DIAGNOSIS — E559 Vitamin D deficiency, unspecified: Secondary | ICD-10-CM | POA: Diagnosis present

## 2013-07-11 DIAGNOSIS — I10 Essential (primary) hypertension: Secondary | ICD-10-CM

## 2013-07-11 DIAGNOSIS — M109 Gout, unspecified: Secondary | ICD-10-CM

## 2013-07-11 DIAGNOSIS — Z96659 Presence of unspecified artificial knee joint: Secondary | ICD-10-CM

## 2013-07-11 DIAGNOSIS — Z6841 Body Mass Index (BMI) 40.0 and over, adult: Secondary | ICD-10-CM

## 2013-07-11 LAB — SURGICAL PCR SCREEN
MRSA, PCR: POSITIVE — AB
STAPHYLOCOCCUS AUREUS: POSITIVE — AB

## 2013-07-11 LAB — URINALYSIS, ROUTINE W REFLEX MICROSCOPIC
Bilirubin Urine: NEGATIVE
Glucose, UA: NEGATIVE mg/dL
Hgb urine dipstick: NEGATIVE
Ketones, ur: NEGATIVE mg/dL
Nitrite: POSITIVE — AB
Protein, ur: NEGATIVE mg/dL
Specific Gravity, Urine: 1.011 (ref 1.005–1.030)
Urobilinogen, UA: 0.2 mg/dL (ref 0.0–1.0)
pH: 7.5 (ref 5.0–8.0)

## 2013-07-11 LAB — BASIC METABOLIC PANEL
BUN: 14 mg/dL (ref 6–23)
CO2: 27 mEq/L (ref 19–32)
Calcium: 9.1 mg/dL (ref 8.4–10.5)
Chloride: 100 mEq/L (ref 96–112)
Creatinine, Ser: 0.61 mg/dL (ref 0.50–1.10)
GFR calc non Af Amer: 82 mL/min — ABNORMAL LOW (ref >90)
Glucose, Bld: 158 mg/dL — ABNORMAL HIGH (ref 70–99)
POTASSIUM: 6 meq/L — AB (ref 3.7–5.3)
SODIUM: 137 meq/L (ref 137–147)

## 2013-07-11 LAB — CBC WITH DIFFERENTIAL/PLATELET
Basophils Absolute: 0 K/uL (ref 0.0–0.1)
Basophils Relative: 0 % (ref 0–1)
Eosinophils Absolute: 0 K/uL (ref 0.0–0.7)
Eosinophils Relative: 1 % (ref 0–5)
HCT: 32.3 % — ABNORMAL LOW (ref 36.0–46.0)
Hemoglobin: 10.5 g/dL — ABNORMAL LOW (ref 12.0–15.0)
Lymphocytes Relative: 19 % (ref 12–46)
Lymphs Abs: 1.1 K/uL (ref 0.7–4.0)
MCH: 26.3 pg (ref 26.0–34.0)
MCHC: 32.5 g/dL (ref 30.0–36.0)
MCV: 80.8 fL (ref 78.0–100.0)
Monocytes Absolute: 0.4 K/uL (ref 0.1–1.0)
Monocytes Relative: 7 % (ref 3–12)
Neutro Abs: 4.4 K/uL (ref 1.7–7.7)
Neutrophils Relative %: 73 % (ref 43–77)
Platelets: 256 K/uL (ref 150–400)
RBC: 4 MIL/uL (ref 3.87–5.11)
RDW: 15 % (ref 11.5–15.5)
WBC: 6 K/uL (ref 4.0–10.5)

## 2013-07-11 LAB — URINE MICROSCOPIC-ADD ON

## 2013-07-11 LAB — POTASSIUM: Potassium: 3.9 meq/L (ref 3.7–5.3)

## 2013-07-11 LAB — GLUCOSE, CAPILLARY
GLUCOSE-CAPILLARY: 145 mg/dL — AB (ref 70–99)
Glucose-Capillary: 138 mg/dL — ABNORMAL HIGH (ref 70–99)

## 2013-07-11 MED ORDER — CHLORHEXIDINE GLUCONATE CLOTH 2 % EX PADS
6.0000 | MEDICATED_PAD | Freq: Every day | CUTANEOUS | Status: AC
  Administered 2013-07-13 – 2013-07-16 (×4): 6 via TOPICAL

## 2013-07-11 MED ORDER — HYDROXYZINE HCL 10 MG PO TABS
10.0000 mg | ORAL_TABLET | Freq: Three times a day (TID) | ORAL | Status: DC | PRN
  Filled 2013-07-11: qty 1

## 2013-07-11 MED ORDER — INSULIN ASPART 100 UNIT/ML ~~LOC~~ SOLN
0.0000 [IU] | Freq: Three times a day (TID) | SUBCUTANEOUS | Status: DC
  Administered 2013-07-12 – 2013-07-13 (×2): 2 [IU] via SUBCUTANEOUS
  Administered 2013-07-13 (×2): 3 [IU] via SUBCUTANEOUS
  Administered 2013-07-14: 2 [IU] via SUBCUTANEOUS
  Administered 2013-07-14 – 2013-07-15 (×3): 3 [IU] via SUBCUTANEOUS
  Administered 2013-07-15: 2 [IU] via SUBCUTANEOUS
  Administered 2013-07-15 – 2013-07-16 (×3): 3 [IU] via SUBCUTANEOUS
  Administered 2013-07-16: 2 [IU] via SUBCUTANEOUS
  Administered 2013-07-16: 3 [IU] via SUBCUTANEOUS
  Administered 2013-07-17: 5 [IU] via SUBCUTANEOUS
  Administered 2013-07-17: 2 [IU] via SUBCUTANEOUS
  Administered 2013-07-17: 3 [IU] via SUBCUTANEOUS

## 2013-07-11 MED ORDER — VITAMIN D3 25 MCG (1000 UNIT) PO TABS
1000.0000 [IU] | ORAL_TABLET | Freq: Every day | ORAL | Status: DC
  Administered 2013-07-11 – 2013-07-17 (×7): 1000 [IU] via ORAL
  Filled 2013-07-11 (×7): qty 1

## 2013-07-11 MED ORDER — MORPHINE SULFATE 2 MG/ML IJ SOLN
2.0000 mg | INTRAMUSCULAR | Status: DC | PRN
  Administered 2013-07-12: 4 mg via INTRAVENOUS
  Filled 2013-07-11: qty 2

## 2013-07-11 MED ORDER — PANTOPRAZOLE SODIUM 40 MG PO TBEC
40.0000 mg | DELAYED_RELEASE_TABLET | Freq: Every day | ORAL | Status: DC
  Administered 2013-07-11 – 2013-07-17 (×7): 40 mg via ORAL
  Filled 2013-07-11 (×7): qty 1

## 2013-07-11 MED ORDER — ALLOPURINOL 300 MG PO TABS
300.0000 mg | ORAL_TABLET | Freq: Every day | ORAL | Status: DC
  Administered 2013-07-11 – 2013-07-17 (×7): 300 mg via ORAL
  Filled 2013-07-11 (×7): qty 1

## 2013-07-11 MED ORDER — VERAPAMIL HCL ER 180 MG PO TBCR
360.0000 mg | EXTENDED_RELEASE_TABLET | Freq: Every day | ORAL | Status: DC
  Administered 2013-07-11 – 2013-07-16 (×6): 360 mg via ORAL
  Filled 2013-07-11 (×9): qty 2

## 2013-07-11 MED ORDER — LORATADINE 10 MG PO TABS
10.0000 mg | ORAL_TABLET | Freq: Every day | ORAL | Status: DC
  Administered 2013-07-11 – 2013-07-17 (×7): 10 mg via ORAL
  Filled 2013-07-11 (×7): qty 1

## 2013-07-11 MED ORDER — MORPHINE SULFATE 4 MG/ML IJ SOLN
4.0000 mg | Freq: Once | INTRAMUSCULAR | Status: AC
  Administered 2013-07-11: 4 mg via INTRAVENOUS
  Filled 2013-07-11: qty 1

## 2013-07-11 MED ORDER — INSULIN ASPART 100 UNIT/ML ~~LOC~~ SOLN: 0.0000 [IU] | Freq: Every day | SUBCUTANEOUS | Status: DC

## 2013-07-11 MED ORDER — DARIFENACIN HYDROBROMIDE ER 7.5 MG PO TB24
7.5000 mg | ORAL_TABLET | Freq: Every day | ORAL | Status: DC
  Administered 2013-07-11 – 2013-07-17 (×7): 7.5 mg via ORAL
  Filled 2013-07-11 (×7): qty 1

## 2013-07-11 MED ORDER — GLIMEPIRIDE 4 MG PO TABS
4.0000 mg | ORAL_TABLET | Freq: Every day | ORAL | Status: DC
  Administered 2013-07-12 – 2013-07-16 (×5): 4 mg via ORAL
  Filled 2013-07-11 (×7): qty 1

## 2013-07-11 MED ORDER — VITAMIN B-12 1000 MCG PO TABS
1000.0000 ug | ORAL_TABLET | Freq: Every day | ORAL | Status: DC
  Administered 2013-07-11 – 2013-07-17 (×7): 1000 ug via ORAL
  Filled 2013-07-11 (×7): qty 1

## 2013-07-11 MED ORDER — MUPIROCIN 2 % EX OINT
1.0000 "application " | TOPICAL_OINTMENT | Freq: Two times a day (BID) | CUTANEOUS | Status: AC
  Administered 2013-07-12 – 2013-07-16 (×8): 1 via NASAL
  Filled 2013-07-11: qty 22

## 2013-07-11 NOTE — ED Notes (Signed)
Per ems pt reports she walks with a cane, pt slipped in her bathroom, thin socks, no shoes on. Pt stuck in bathroom, door was removed from bathroom. 22 G R Hand, 250 mcg fentanyl given. Initially pain 10/10, at present pain 7/10. Shortening to right leg.

## 2013-07-11 NOTE — Consult Note (Signed)
[facility] Medical Consultation  Darce Gilde [id number] DOB: 1928-08-03 DOA: 07/11/2013 PCP: [name], MD   Requesting physician: Dr. Latanya Maudlin Date of consultation: 2/4 Reason for consultation: Medical management  Impression/Recommendations Active Problems:  Diabetes mellitus -Okay blood glucose control at this time, will place on sliding scale as she'll be n.p.o. after midnight per orthopedics -Follow and resume by mouth meds. As appropriate -Monitor CBGs and cover with sliding scale as above Hypertension -Continue therapy now, monitor and further treat accordingly GERD -Place on PPI History of gout -Continue allopurinol Normocytic Anemia -Likely chronic disease, no gross bleeding -Follow and recheck Fracture of femur, right, closed -Per primary team -She is chest pain-free and EKG with no acute ischemic changes, in no known-history of CAD. Also is oxygenating well on room air chest x-ray with no acute infiltrates. Her surgical risk is due to her age and comorbidities but ok from medicine standpoint to proceed with her needed surgery at this time.  [facility] will followup again tomorrow. Please contact me if I can be of assistance in the meanwhile. Thank you for this consultation.    HPI:  The patient is an 80 year old morbidly obese female with past medical history significant for diabetes mellitus, hypertension GERD, gout, who presents status post fall and in the ED x-ray revealed a right comminuted fracture of the distal femur with fracture fragment extending toward but not abutting the total knee prosthesis>> admission to orthopedic requested and [facility]/medicine consulted for medical management. Patient denies chest pain, shortness of breath, syncope, nausea and no vomiting. She also denies cough, fevers, dysuria, polydipsia and no polyuria.  Review of Systems:  The patient denies anorexia, fever, weight loss,, vision loss, decreased hearing, hoarseness, chest pain,  syncope, dyspnea on exertion, peripheral edema, balance deficits, hemoptysis, abdominal pain, melena, hematochezia, hematuria, incontinence, genital sores, muscle weakness.   Past Medical History  Diagnosis Date  . Leiomyoma 2-02    colonic polypoid   . Diabetes mellitus     type II  . GERD (gastroesophageal reflux disease)   . Gout   . Hypertension   . Osteoarthritis   . Urinary incontinence   . Morbid obesity   . Pain in joint, lower leg   . Unspecified vitamin D deficiency   . Hyperkalemia   . Gastritis     w/ hx of h. pylori  . Diverticulosis    Past Surgical History  Procedure Laterality Date  . Right tkr    . Thr left    . Thr left 3/4    . Total hip arthroplasty  2009    right  . Cholecystectomy    . Total hip arthroplasty      left  . Lithotripsy     Social History:  reports that she quit smoking about 53 years ago. She has never used smokeless tobacco. She reports that she does not drink alcohol or use illicit drugs.  Allergies  Allergen Reactions  . Penicillins    Family History  Problem Relation Age of Onset  . Cancer Sister     breast, stomach  . Diabetes Sister   . Diabetes Other     Prior to Admission medications   Medication Sig Start Date End Date Taking? Authorizing Provider  allopurinol (ZYLOPRIM) 300 MG tablet Take 1 tablet (300 mg total) by mouth daily. 05/10/13  Yes Evie Lacks Plotnikov, MD  aspirin 81 MG chewable tablet Chew 81 mg by mouth daily.     Yes Historical Provider, MD  Cholecalciferol (EQL VITAMIN D3) 1000 UNITS tablet Take 1,000 Units by mouth daily.     Yes Historical Provider, MD  darifenacin (ENABLEX) 7.5 MG 24 hr tablet Take 1 tablet (7.5 mg total) by mouth daily. 12/14/12  Yes Evie Lacks Plotnikov, MD  glimepiride (AMARYL) 4 MG tablet Take 1 tablet (4 mg total) by mouth 2 (two) times daily. 05/10/13  Yes Evie Lacks Plotnikov, MD  hydrOXYzine (ATARAX/VISTARIL) 10 MG tablet Take 10 mg by mouth 3 (three) times daily as needed.   Yes  Historical Provider, MD  ibuprofen (ADVIL,MOTRIN) 200 MG tablet Take 400 mg by mouth every 6 (six) hours as needed.   Yes Historical Provider, MD  loratadine (CLARITIN) 10 MG tablet Take 10 mg by mouth daily.   Yes Historical Provider, MD  losartan (COZAAR) 50 MG tablet Take 1 tablet (50 mg total) by mouth daily. 05/10/13 05/10/14 Yes Evie Lacks Plotnikov, MD  metFORMIN (GLUCOPHAGE) 500 MG tablet Take 1 tablet (500 mg total) by mouth 2 (two) times daily with a meal. 05/10/13  Yes Evie Lacks Plotnikov, MD  potassium chloride (KLOR-CON 10) 10 MEQ tablet Take 1 tablet (10 mEq total) by mouth daily. 05/09/13  Yes Evie Lacks Plotnikov, MD  verapamil (VERELAN PM) 360 MG 24 hr capsule Take 1 capsule (360 mg total) by mouth at bedtime. 05/10/13  Yes Evie Lacks Plotnikov, MD  vitamin B-12 (CYANOCOBALAMIN) 1000 MCG tablet Take 1,000 mcg by mouth daily.     Yes Historical Provider, MD  HYDROcodone-acetaminophen (NORCO) 7.5-325 MG per tablet Take 0.5-1 tablets by mouth every 8 (eight) hours as needed. Please fill on or after 05/15/13 03/15/13 06/19/13  Cassandria Anger, MD   Physical Exam: Blood pressure 166/74, pulse 74, temperature 97.8 F (36.6 C), temperature source Oral, resp. rate 16, SpO2 97.00%. Filed Vitals:   07/11/13 1725  BP: 166/74  Pulse: 74  Temp: 97.8 F (36.6 C)  Resp: 16    Constitutional: Vital signs reviewed.  Patient is a well-developed, moderately obese in no acute distress and cooperative with exam. Alert and oriented x3.  Head: Normocephalic and atraumatic Mouth: no erythema or exudates, MMM Eyes: PERRL, EOMI, conjunctivae normal, No scleral icterus.  Neck: Supple, Trachea midline normal ROM, No JVD, mass, thyromegaly, or carotid bruit present.  Cardiovascular: RRR, S1 normal, S2 normal, no MRG, pulses symmetric and intact bilaterally Pulmonary/Chest: normal respiratory effort, CTAB, no wheezes, rales, or rhonchi Abdominal: Soft. Non-tender, non-distended, bowel sounds are normal, no  masses, organomegaly, or guarding present.  GU: no CVA tenderness  Extremities: No cyanosis and no edema  Hematology: no cervical, inginal, or axillary adenopathy.  Neurological: A&O x3, Strength is normal and symmetric bilaterally, cranial nerve II-XII are grossly intact, no focal motor deficit, sensory intact to light touch bilaterally.  Skin: Warm, dry and intact. No rash, cyanosis, or clubbing.  Psychiatric: Normal mood and affect. speech and behavior is normal.  Labs on Admission:  Basic Metabolic Panel:  Recent Labs Lab 07/11/13 1510 07/11/13 1605  NA 137  --   K 6.0* 3.9  CL 100  --   CO2 27  --   GLUCOSE 158*  --   BUN 14  --   CREATININE 0.61  --   CALCIUM 9.1  --    Liver Function Tests: No results found for this basename: AST, ALT, ALKPHOS, BILITOT, PROT, ALBUMIN,  in the last 168 hours No results found for this basename: LIPASE, AMYLASE,  in the last 168 hours No results found for  this basename: AMMONIA,  in the last 168 hours CBC:  Recent Labs Lab 07/11/13 1345  WBC 6.0  NEUTROABS 4.4  HGB 10.5*  HCT 32.3*  MCV 80.8  PLT 256   Cardiac Enzymes: No results found for this basename: CKTOTAL, CKMB, CKMBINDEX, TROPONINI,  in the last 168 hours BNP: No components found with this basename: POCBNP,  CBG:  Recent Labs Lab 07/11/13 1754  GLUCAP 138*    Radiological Exams on Admission: Dg Hip Complete Right  07/11/2013   CLINICAL DATA:  Trauma, fall  EXAM: RIGHT HIP - COMPLETE 2+ VIEW  COMPARISON:  Right knee radiographs 07/11/2013  FINDINGS: Bilateral prostheses.  Bones demineralized.  Large calcified uterine leiomyoma in pelvis, 10.2 x 6.1 cm.  Extensive atherosclerotic calcification in numerous pelvic phleboliths.  Comminuted oblique distal right femoral meta diaphyseal fracture identified at the margin of the exam, more occluding delineated and reported on knee radiographs.  Proximal right femur appears intact.  IMPRESSION: Comminuted displaced distal right  femoral metaphyseal fracture better visualized and reported on right knee radiograph exam.  Bilateral hip prostheses.  Diffuse osseous demineralization.  No acute proximal right femoral abnormality.   Electronically Signed   By: Lavonia Dana M.D.   On: 07/11/2013 13:10   Dg Femur Right  07/11/2013   CLINICAL DATA:  Pain post trauma  EXAM: RIGHT FEMUR - 2 VIEW  COMPARISON:  Right knee July 11, 2013  FINDINGS: Frontal, oblique, and lateral images were obtained. There is a comminuted fracture of the distal femoral diaphysis with posterior and medial displacement of the more posteromedial fracture fragment. The fracture does not extend into the space occupied by the femoral component of a total knee replacement. The distal aspect of the fracture is near the junction with the prosthesis, however. There is also a total hip prosthesis.  No dislocations. No other fractures. There is extensive arterial vascular calcification.  IMPRESSION: Comminuted fracture distal femur with fracture fragment extending toward but not abutting the total knee prosthesis. Posteriorly, the distal fracture fragment does extend into the distal metaphysis of the femur. No dislocation.   Electronically Signed   By: Lowella Grip M.D.   On: 07/11/2013 14:42   Dg Chest Port 1 View  07/11/2013   CLINICAL DATA:  Leg fracture, hypertension, diabetes  EXAM: PORTABLE CHEST - 1 VIEW  COMPARISON:  Portable exam 1339 hr compared to 03/16/2011  FINDINGS: Enlargement of cardiac silhouette.  Tortuous aorta.  Prominent right superior mediastinal soft tissues unchanged.  Pulmonary vascularity normal.  Lungs clear.  No pleural effusion or pneumothorax.  Scattered endplate spur formation thoracic spine.  Bilateral glenohumeral degenerative changes and probable chronic rotator cuff tears.  IMPRESSION: Enlargement of cardiac silhouette.  No acute abnormalities.   Electronically Signed   By: Lavonia Dana M.D.   On: 07/11/2013 13:49   Dg Knee Complete 4  Views Right  07/11/2013   CLINICAL DATA:  Trauma, fall  EXAM: RIGHT KNEE - COMPLETE 4+ VIEW  COMPARISON:  None  FINDINGS: Components of a right knee prosthesis are identified in expected positions.  Bones appear diffusely demineralized.  Oblique comminuted fracture distal right femoral metadiaphysis, displaced posteriorly and slightly laterally with overriding.  Fracture planes extend to near but not definitely to the femoral component of the right knee prosthesis.  No additional fracture or dislocation.  Extensive atherosclerotic calcification.  IMPRESSION: Comminuted oblique displaced distal right femoral metadiaphyseal fracture.  Osseous demineralization with note of components of a right knee prosthesis.   Electronically  Signed   By: Lavonia Dana M.D.   On: 07/11/2013 13:08    EKG: Independently reviewed-Normal sinus rhythm at 75, no acute ischemic changes   Time spent: >30  [facility] Pager [phone]  If 7PM-7AM, please contact night-coverage [email or web address] Password [email or web address] 07/11/2013, 7:12 PM

## 2013-07-11 NOTE — H&P (Signed)
Melrose Nakayama, MD           Chauncey Cruel, PA-C   [address], [place], [place]  [address]   ORTHOPAEDIC HISTORY AND PHYSICAL  Holly Banks            MRN:  [id number] DOB/SEX:  07/12/1928/female    CHIEF COMPLAINT:  Painful right leg  HISTORY: Holly Banks a 80 y.o. female with a history of right knee and hip replacements many years back.  She was walking at home and slipped today and had terrible pain above her replaced right knee.  No LOC and denies pain elsewhere.  No chest pain either.  Eventually taken to [facility] showed complicated femur fracture.  ORS consulted and we will admit with medical consult.   PAST MEDICAL HISTORY: Patient Active Problem List   Diagnosis Date Noted  . Fracture of femur, right, closed 07/11/2013  . URI (upper respiratory infection) 06/15/2013  . Cough 03/15/2013  . Vision loss, right eye 01/05/2012  . UTI (lower urinary tract infection) 03/31/2011  . Rash 11/05/2010  . SHOULDER PAIN 04/17/2009  . TOBACCO USE, QUIT 04/17/2009  . GASTRITIS 12/25/2008  . ABNORMAL EXAM-BILIARY TRACT 10/16/2008  . GASTROINTESTINAL XRAY, ABNORMAL 10/16/2008  . ANAL PRURITUS 09/17/2008  . Edema 09/17/2008  . OTHER DYSPHAGIA 09/17/2008  . HAND PAIN 09/07/2007  . B12 DEFICIENCY 07/05/2007  . PARESTHESIA 07/05/2007  . TINNITUS NOS 03/23/2007  . URINARY INCONTINENCE 03/21/2007  . DIABETES MELLITUS, TYPE II 01/16/2007  . VITAMIN D DEFICIENCY 01/16/2007  . GOUT 01/16/2007  . HYPERKALEMIA 01/16/2007  . OBESITY, MORBID 01/16/2007  . HYPERTENSION 01/16/2007  . GERD 01/16/2007  . OSTEOARTHRITIS 01/16/2007  . SYMPTOM, INCONTINENCE, URINARY NEC 01/16/2007  . COLONIC POLYPS, HX OF 01/16/2007   Past Medical History  Diagnosis Date  . Leiomyoma 2-02    colonic polypoid   . Diabetes mellitus     type II  . GERD (gastroesophageal reflux disease)   . Gout   . Hypertension   . Osteoarthritis   . Urinary incontinence   . Morbid obesity   . Pain in  joint, lower leg   . Unspecified vitamin D deficiency   . Hyperkalemia   . Gastritis     w/ hx of h. pylori  . Diverticulosis    Past Surgical History  Procedure Laterality Date  . Right tkr    . Thr left    . Thr left 3/4    . Total hip arthroplasty  2009    right  . Cholecystectomy    . Total hip arthroplasty      left  . Lithotripsy       MEDICATIONS:  Current facility-administered medications:allopurinol (ZYLOPRIM) tablet 300 mg, 300 mg, Oral, Daily, Adeline C Viyuoh, MD;  cholecalciferol (VITAMIN D) tablet 1,000 Units, 1,000 Units, Oral, Daily, Adeline C Viyuoh, MD;  darifenacin (ENABLEX) 24 hr tablet 7.5 mg, 7.5 mg, Oral, Daily, Adeline C Viyuoh, MD;  [START ON 07/12/2013] glimepiride (AMARYL) tablet 4 mg, 4 mg, Oral, Q2200, Adeline C Viyuoh, MD hydrOXYzine (ATARAX/VISTARIL) tablet 10 mg, 10 mg, Oral, TID PRN, Sheila Oats, MD;  [START ON 07/12/2013] insulin aspart (novoLOG) injection 0-15 Units, 0-15 Units, Subcutaneous, TID WC, Adeline C Viyuoh, MD;  insulin aspart (novoLOG) injection 0-5 Units, 0-5 Units, Subcutaneous, QHS, Adeline C Viyuoh, MD;  loratadine (CLARITIN) tablet 10 mg, 10 mg, Oral, Daily, Adeline C Viyuoh, MD morphine 2 MG/ML injection 2-6 mg, 2-6 mg, Intravenous, Q4H PRN, Erlene Senters, PA-C;  pantoprazole (PROTONIX) EC tablet 40 mg, 40 mg, Oral, Daily, Adeline C Viyuoh, MD;  verapamil (CALAN-SR) CR tablet 360 mg, 360 mg, Oral, QHS, Adeline C Viyuoh, MD;  vitamin B-12 (CYANOCOBALAMIN) tablet 1,000 mcg, 1,000 mcg, Oral, Daily, Adeline C Viyuoh, MD  ALLERGIES:   Allergies  Allergen Reactions  . Penicillins     REVIEW OF SYSTEMS: REVIEWED IN DETAIL IN CHART  FAMILY HISTORY:   Family History  Problem Relation Age of Onset  . Cancer Sister     breast, stomach  . Diabetes Sister   . Diabetes Other     SOCIAL HISTORY:   History  Substance Use Topics  . Smoking status: Former Smoker    Quit date: 06/07/1960  . Smokeless tobacco: Never Used  . Alcohol  Use: No     EXAMINATION: Vital signs in last 24 hours: Temp:  [97.8 F (36.6 C)] 97.8 F (36.6 C) (02/04 1725) Pulse Rate:  [74-81] 74 (02/04 1725) Resp:  [16-18] 16 (02/04 1725) BP: (145-166)/(71-74) 166/74 mmHg (02/04 1725) SpO2:  [95 %-100 %] 97 % (02/04 1725)  BP 166/74  Pulse 74  Temp(Src) 97.8 F (36.6 C) (Oral)  Resp 16  SpO2 97%  General Appearance:    Alert, cooperative, no distress, appears stated age  Head:    Normocephalic, without obvious abnormality, atraumatic  Eyes:    PERRL, conjunctiva/corneas clear, EOM's intact, fundi    benign, both eyes  Ears:    Normal TM's and external ear canals, both ears  Nose:   Nares normal, septum midline, mucosa normal, no drainage    or sinus tenderness  Throat:   Lips, mucosa, and tongue normal; teeth and gums normal  Neck:   Supple, symmetrical, trachea midline, no adenopathy;    thyroid:  no enlargement/tenderness/nodules; no carotid   bruit or JVD  Back:     Symmetric, no curvature, ROM normal, no CVA tenderness  Lungs:     Clear to auscultation bilaterally, respirations unlabored  Chest Wall:    No tenderness or deformity   Heart:    Regular rate and rhythm, S1 and S2 normal, no murmur, rub   or gallop  Breast Exam:    No tenderness, masses, or nipple abnormality  Abdomen:     Soft, non-tender, bowel sounds active all four quadrants,    no masses, no organomegaly  Genitalia:    Normal female without lesion, discharge or tenderness  Rectal:    Normal tone, normal prostate, no masses or tenderness;   guaiac negative stool  Extremities:   Extremities normal, atraumatic, no cyanosis or edema  Pulses:   2+ and symmetric all extremities  Skin:   Skin color, texture, turgor normal, no rashes or lesions  Lymph nodes:   Cervical, supraclavicular, and axillary nodes normal  Neurologic:   CNII-XII intact, normal strength, sensation and reflexes    throughout    Musculoskeletal Exam  :right leg painful to any attempted motion.   S and M fxn intact in feet.  Other leg and both arms move fully.  Comps soft   DIAGNOSTIC STUDIES: Recent laboratory studies:  Recent Labs  07/11/13 1345  WBC 6.0  HGB 10.5*  HCT 32.3*  PLT 256    Recent Labs  07/11/13 1510 07/11/13 1605  NA 137  --   K 6.0* 3.9  CL 100  --   CO2 27  --   BUN 14  --   CREATININE 0.61  --   GLUCOSE 158*  --  CALCIUM 9.1  --    Lab Results  Component Value Date   INR 2.2* 10/25/2007   INR 1.9* 10/24/2007   INR 2.1* 10/23/2007     Recent Radiographic Studies :  Dg Hip Complete Right  07/11/2013   CLINICAL DATA:  Trauma, fall  EXAM: RIGHT HIP - COMPLETE 2+ VIEW  COMPARISON:  Right knee radiographs 07/11/2013  FINDINGS: Bilateral prostheses.  Bones demineralized.  Large calcified uterine leiomyoma in pelvis, 10.2 x 6.1 cm.  Extensive atherosclerotic calcification in numerous pelvic phleboliths.  Comminuted oblique distal right femoral meta diaphyseal fracture identified at the margin of the exam, more occluding delineated and reported on knee radiographs.  Proximal right femur appears intact.  IMPRESSION: Comminuted displaced distal right femoral metaphyseal fracture better visualized and reported on right knee radiograph exam.  Bilateral hip prostheses.  Diffuse osseous demineralization.  No acute proximal right femoral abnormality.   Electronically Signed   By: Lavonia Dana M.D.   On: 07/11/2013 13:10   Dg Femur Right  07/11/2013   CLINICAL DATA:  Pain post trauma  EXAM: RIGHT FEMUR - 2 VIEW  COMPARISON:  Right knee July 11, 2013  FINDINGS: Frontal, oblique, and lateral images were obtained. There is a comminuted fracture of the distal femoral diaphysis with posterior and medial displacement of the more posteromedial fracture fragment. The fracture does not extend into the space occupied by the femoral component of a total knee replacement. The distal aspect of the fracture is near the junction with the prosthesis, however. There is also a total hip  prosthesis.  No dislocations. No other fractures. There is extensive arterial vascular calcification.  IMPRESSION: Comminuted fracture distal femur with fracture fragment extending toward but not abutting the total knee prosthesis. Posteriorly, the distal fracture fragment does extend into the distal metaphysis of the femur. No dislocation.   Electronically Signed   By: Lowella Grip M.D.   On: 07/11/2013 14:42   Dg Chest Port 1 View  07/11/2013   CLINICAL DATA:  Leg fracture, hypertension, diabetes  EXAM: PORTABLE CHEST - 1 VIEW  COMPARISON:  Portable exam 1339 hr compared to 03/16/2011  FINDINGS: Enlargement of cardiac silhouette.  Tortuous aorta.  Prominent right superior mediastinal soft tissues unchanged.  Pulmonary vascularity normal.  Lungs clear.  No pleural effusion or pneumothorax.  Scattered endplate spur formation thoracic spine.  Bilateral glenohumeral degenerative changes and probable chronic rotator cuff tears.  IMPRESSION: Enlargement of cardiac silhouette.  No acute abnormalities.   Electronically Signed   By: Lavonia Dana M.D.   On: 07/11/2013 13:49   Dg Knee Complete 4 Views Right  07/11/2013   CLINICAL DATA:  Trauma, fall  EXAM: RIGHT KNEE - COMPLETE 4+ VIEW  COMPARISON:  None  FINDINGS: Components of a right knee prosthesis are identified in expected positions.  Bones appear diffusely demineralized.  Oblique comminuted fracture distal right femoral metadiaphysis, displaced posteriorly and slightly laterally with overriding.  Fracture planes extend to near but not definitely to the femoral component of the right knee prosthesis.  No additional fracture or dislocation.  Extensive atherosclerotic calcification.  IMPRESSION: Comminuted oblique displaced distal right femoral metadiaphyseal fracture.  Osseous demineralization with note of components of a right knee prosthesis.   Electronically Signed   By: Lavonia Dana M.D.   On: 07/11/2013 13:08    ASSESSMENT: right supracondylar  periprosthetic fracture   PLAN: needs operative stabilization in order to sit and potentially stand again.  Discussed risks including reaction to anesthesia, infection,  nonunion, DVT, etc related to ORIF which we will perform tomorrow afternoon as she has been cleared by medical team already.  May need blood postop as there will be extensive blood loss.  Will try to use tranexamic acid intraop.  Discussed with her daughter Jaclyn Prime over phone in room.  Also suspect she will need rehab stay and maybe even placement.  Normally lives at home and uses a cane.     Charlestine Rookstool G 07/11/2013, 8:05 PM

## 2013-07-11 NOTE — ED Provider Notes (Signed)
CSN: [id number]     Arrival date & time 07/11/13  1154 History   First MD Initiated Contact with Patient 07/11/13 1218     Chief Complaint  Patient presents with  . Fall  . hip fracture?    (Consider location/radiation/quality/duration/timing/severity/associated sxs/prior Treatment) HPI Comments: 80 yo female presenting after a mechanical fall complaining of right leg pain.  She states "I just lost my balance and didn't have anything to grab onto."  Denies syncope or LOC.  Pain is better since getting Fentanyl en route by EMS.  Pain worse with movement of right leg.    Patient is a 80 y.o. female presenting with fall.  Fall This is a new problem. The current episode started 1 to 2 hours ago. Episode frequency: once. The problem has been resolved. Pertinent negatives include no chest pain, no abdominal pain, no headaches and no shortness of breath. Associated symptoms comments: No head trauma, no LOC. Nothing aggravates the symptoms. Relieved by: fentanyl PTA.    Past Medical History  Diagnosis Date  . Leiomyoma 2-02    colonic polypoid   . Diabetes mellitus     type II  . GERD (gastroesophageal reflux disease)   . Gout   . Hypertension   . Osteoarthritis   . Urinary incontinence   . Morbid obesity   . Pain in joint, lower leg   . Unspecified vitamin D deficiency   . Hyperkalemia   . Gastritis     w/ hx of h. pylori  . Diverticulosis    Past Surgical History  Procedure Laterality Date  . Right tkr    . Thr left    . Thr left 3/4    . Total hip arthroplasty  2009    right  . Cholecystectomy    . Total hip arthroplasty      left  . Lithotripsy     Family History  Problem Relation Age of Onset  . Cancer Sister     breast, stomach  . Diabetes Sister   . Diabetes Other    History  Substance Use Topics  . Smoking status: Former Smoker    Quit date: 06/07/1960  . Smokeless tobacco: Not on file  . Alcohol Use: No   OB History   Grav Para Term Preterm Abortions TAB  SAB Ect Mult Living                 Review of Systems  Constitutional: Negative for fever.  HENT: Negative for congestion.   Respiratory: Negative for cough and shortness of breath.   Cardiovascular: Negative for chest pain.  Gastrointestinal: Negative for nausea, vomiting, abdominal pain and diarrhea.  Neurological: Negative for headaches.  All other systems reviewed and are negative.    Allergies  Penicillins  Home Medications   Current Outpatient Rx  Name  Route  Sig  Dispense  Refill  . allopurinol (ZYLOPRIM) 300 MG tablet   Oral   Take 1 tablet (300 mg total) by mouth daily.   90 tablet   3   . aspirin 81 MG chewable tablet   Oral   Chew 81 mg by mouth daily.           . Cholecalciferol (EQL VITAMIN D3) 1000 UNITS tablet   Oral   Take 1,000 Units by mouth daily.           [name] darifenacin (ENABLEX) 7.5 MG 24 hr tablet   Oral   Take 1 tablet (7.5 mg total) by  mouth daily.   30 tablet   11   . glimepiride (AMARYL) 4 MG tablet   Oral   Take 1 tablet (4 mg total) by mouth 2 (two) times daily.   180 tablet   3   . hydrOXYzine (ATARAX/VISTARIL) 10 MG tablet   Oral   Take 10 mg by mouth 3 (three) times daily as needed.         [name]. ibuprofen (ADVIL,MOTRIN) 200 MG tablet   Oral   Take 400 mg by mouth every 6 (six) hours as needed.         . loratadine (CLARITIN) 10 MG tablet   Oral   Take 10 mg by mouth daily.         [name]. losartan (COZAAR) 50 MG tablet   Oral   Take 1 tablet (50 mg total) by mouth daily.   90 tablet   3   . metFORMIN (GLUCOPHAGE) 500 MG tablet   Oral   Take 1 tablet (500 mg total) by mouth 2 (two) times daily with a meal.   180 tablet   3   . potassium chloride (KLOR-CON 10) 10 MEQ tablet   Oral   Take 1 tablet (10 mEq total) by mouth daily.   90 tablet   3   . verapamil (VERELAN PM) 360 MG 24 hr capsule   Oral   Take 1 capsule (360 mg total) by mouth at bedtime.   90 capsule   3   . vitamin B-12 (CYANOCOBALAMIN) 1000  MCG tablet   Oral   Take 1,000 mcg by mouth daily.           [name]. EXPIRED: HYDROcodone-acetaminophen (NORCO) 7.5-325 MG per tablet   Oral   Take 0.5-1 tablets by mouth every 8 (eight) hours as needed. Please fill on or after 05/15/13   90 tablet   0    BP 145/71  Pulse 81  Temp(Src) 97.8 F (36.6 C) (Oral)  Resp 18  SpO2 97% Physical Exam  Nursing note and vitals reviewed. Constitutional: She is oriented to person, place, and time. She appears well-developed and well-nourished. No distress.  HENT:  Head: Normocephalic and atraumatic.  Mouth/Throat: Oropharynx is clear and moist.  Eyes: Conjunctivae are normal. Pupils are equal, round, and reactive to light. No scleral icterus.  Neck: Normal range of motion and full passive range of motion without pain. Neck supple. No spinous process tenderness and no muscular tenderness present. Normal range of motion present.  Cardiovascular: Normal rate, regular rhythm, normal heart sounds and intact distal pulses.   No murmur heard. Pulmonary/Chest: Effort normal and breath sounds normal. No stridor. No respiratory distress. She has no rales.  Abdominal: Soft. Bowel sounds are normal. She exhibits no distension. There is no tenderness.  Musculoskeletal: Normal range of motion.  Neurological: She is alert and oriented to person, place, and time.  Skin: Skin is warm and dry. No rash noted.  Psychiatric: She has a normal mood and affect. Her behavior is normal.    ED Course  Procedures (including critical care time) Labs Review Labs Reviewed  CBC WITH DIFFERENTIAL - Abnormal; Notable for the following:    Hemoglobin 10.5 (*)    HCT 32.3 (*)    All other components within normal limits  BASIC METABOLIC PANEL - Abnormal; Notable for the following:    Potassium 6.0 (*)    Glucose, Bld 158 (*)    GFR calc non Af Amer 82 (*)    All  other components within normal limits  URINALYSIS, ROUTINE W REFLEX MICROSCOPIC  POTASSIUM   Imaging  Review Dg Hip Complete Right  07/11/2013   CLINICAL DATA:  Trauma, fall  EXAM: RIGHT HIP - COMPLETE 2+ VIEW  COMPARISON:  Right knee radiographs 07/11/2013  FINDINGS: Bilateral prostheses.  Bones demineralized.  Large calcified uterine leiomyoma in pelvis, 10.2 x 6.1 cm.  Extensive atherosclerotic calcification in numerous pelvic phleboliths.  Comminuted oblique distal right femoral meta diaphyseal fracture identified at the margin of the exam, more occluding delineated and reported on knee radiographs.  Proximal right femur appears intact.  IMPRESSION: Comminuted displaced distal right femoral metaphyseal fracture better visualized and reported on right knee radiograph exam.  Bilateral hip prostheses.  Diffuse osseous demineralization.  No acute proximal right femoral abnormality.   Electronically Signed   By: Lavonia Dana M.D.   On: 07/11/2013 13:10   Dg Femur Right  07/11/2013   CLINICAL DATA:  Pain post trauma  EXAM: RIGHT FEMUR - 2 VIEW  COMPARISON:  Right knee July 11, 2013  FINDINGS: Frontal, oblique, and lateral images were obtained. There is a comminuted fracture of the distal femoral diaphysis with posterior and medial displacement of the more posteromedial fracture fragment. The fracture does not extend into the space occupied by the femoral component of a total knee replacement. The distal aspect of the fracture is near the junction with the prosthesis, however. There is also a total hip prosthesis.  No dislocations. No other fractures. There is extensive arterial vascular calcification.  IMPRESSION: Comminuted fracture distal femur with fracture fragment extending toward but not abutting the total knee prosthesis. Posteriorly, the distal fracture fragment does extend into the distal metaphysis of the femur. No dislocation.   Electronically Signed   By: Lowella Grip M.D.   On: 07/11/2013 14:42   Dg Chest Port 1 View  07/11/2013   CLINICAL DATA:  Leg fracture, hypertension, diabetes  EXAM:  PORTABLE CHEST - 1 VIEW  COMPARISON:  Portable exam 1339 hr compared to 03/16/2011  FINDINGS: Enlargement of cardiac silhouette.  Tortuous aorta.  Prominent right superior mediastinal soft tissues unchanged.  Pulmonary vascularity normal.  Lungs clear.  No pleural effusion or pneumothorax.  Scattered endplate spur formation thoracic spine.  Bilateral glenohumeral degenerative changes and probable chronic rotator cuff tears.  IMPRESSION: Enlargement of cardiac silhouette.  No acute abnormalities.   Electronically Signed   By: Lavonia Dana M.D.   On: 07/11/2013 13:49   Dg Knee Complete 4 Views Right  07/11/2013   CLINICAL DATA:  Trauma, fall  EXAM: RIGHT KNEE - COMPLETE 4+ VIEW  COMPARISON:  None  FINDINGS: Components of a right knee prosthesis are identified in expected positions.  Bones appear diffusely demineralized.  Oblique comminuted fracture distal right femoral metadiaphysis, displaced posteriorly and slightly laterally with overriding.  Fracture planes extend to near but not definitely to the femoral component of the right knee prosthesis.  No additional fracture or dislocation.  Extensive atherosclerotic calcification.  IMPRESSION: Comminuted oblique displaced distal right femoral metadiaphyseal fracture.  Osseous demineralization with note of components of a right knee prosthesis.   Electronically Signed   By: Lavonia Dana M.D.   On: 07/11/2013 13:08  All radiology studies independently viewed by me.     EKG Interpretation    Date/Time:  Wednesday July 11 2013 13:33:26 EST Ventricular Rate:  75 PR Interval:  200 QRS Duration: 104 QT Interval:  434 QTC Calculation: 485 R Axis:   25 Text Interpretation:  Sinus rhythm Atrial premature complex Low voltage, precordial leads Baseline wander in lead(s) III V3 No significant change was found Confirmed by [facility]  MD, TREY ([id number]) on 07/11/2013 3:53:24 PM            MDM   1. Fracture of femur, right, closed   2. Fall    80 yo female with  right femur fracture after a mechanical fall.  Pain controlled currently with Fentanyl given by EMS.  No other injuries identified by history or physical exam.  Discussed case with Dr. Jerl Santos who anticipates operative management tomorrow.    Per conversation with Dr. Suanne Marker, she will consult and Dr. Jerl Santos will admit.  K to be repeated (suspect false elevation).    Candyce Churn III, MD 07/11/13 [phone]

## 2013-07-11 NOTE — ED Notes (Signed)
Bed: NL97 Expected date:  Expected time:  Means of arrival:  Comments: EMS fall, hip fx?

## 2013-07-11 NOTE — ED Notes (Signed)
Pt alert and oriented x4. Respirations even and unlabored, bilateral symmetrical rise and fall of chest. Skin warm and dry. In no acute distress. Denies needs.   

## 2013-07-12 ENCOUNTER — Encounter (HOSPITAL_COMMUNITY)
Admission: EM | Disposition: A | Discharge status: Skilled Nursing Facility | Payer: Self-pay | Source: Home / Self Care | Attending: Orthopaedic Surgery

## 2013-07-12 ENCOUNTER — Inpatient Hospital Stay (HOSPITAL_COMMUNITY): Payer: Medicare PPO

## 2013-07-12 ENCOUNTER — Inpatient Hospital Stay (HOSPITAL_COMMUNITY): Payer: Medicare PPO | Admitting: Anesthesiology

## 2013-07-12 ENCOUNTER — Encounter (HOSPITAL_COMMUNITY): Payer: Self-pay | Admitting: Critical Care Medicine

## 2013-07-12 ENCOUNTER — Encounter (HOSPITAL_COMMUNITY): Payer: Medicare PPO | Admitting: Anesthesiology

## 2013-07-12 ENCOUNTER — Encounter: Payer: Self-pay | Admitting: Internal Medicine

## 2013-07-12 DIAGNOSIS — I10 Essential (primary) hypertension: Secondary | ICD-10-CM

## 2013-07-12 DIAGNOSIS — S7291XA Unspecified fracture of right femur, initial encounter for closed fracture: Secondary | ICD-10-CM

## 2013-07-12 DIAGNOSIS — E119 Type 2 diabetes mellitus without complications: Secondary | ICD-10-CM

## 2013-07-12 DIAGNOSIS — M109 Gout, unspecified: Secondary | ICD-10-CM

## 2013-07-12 HISTORY — PX: ORIF FEMUR FRACTURE: SHX2119

## 2013-07-12 LAB — GLUCOSE, CAPILLARY
GLUCOSE-CAPILLARY: 110 mg/dL — AB (ref 70–99)
GLUCOSE-CAPILLARY: 192 mg/dL — AB (ref 70–99)
Glucose-Capillary: 136 mg/dL — ABNORMAL HIGH (ref 70–99)
Glucose-Capillary: 120 mg/dL — ABNORMAL HIGH (ref 70–99)
Glucose-Capillary: 155 mg/dL — ABNORMAL HIGH (ref 70–99)

## 2013-07-12 LAB — CBC
HEMATOCRIT: 29.2 % — AB (ref 36.0–46.0)
Hemoglobin: 9.3 g/dL — ABNORMAL LOW (ref 12.0–15.0)
MCH: 26.3 pg (ref 26.0–34.0)
MCHC: 31.8 g/dL (ref 30.0–36.0)
MCV: 82.5 fL (ref 78.0–100.0)
PLATELETS: 166 10*3/uL (ref 150–400)
RBC: 3.54 MIL/uL — ABNORMAL LOW (ref 3.87–5.11)
RDW: 14.8 % (ref 11.5–15.5)
WBC: 6.7 10*3/uL (ref 4.0–10.5)

## 2013-07-12 LAB — URINE CULTURE: Colony Count: >=100000

## 2013-07-12 LAB — PREPARE RBC (CROSSMATCH)

## 2013-07-12 SURGERY — OPEN REDUCTION INTERNAL FIXATION (ORIF) DISTAL FEMUR FRACTURE
Anesthesia: General | Laterality: Right

## 2013-07-12 SURGERY — OPEN REDUCTION INTERNAL FIXATION (ORIF) DISTAL FEMUR FRACTURE
Anesthesia: General | Site: Leg Upper

## 2013-07-12 MED ORDER — DEXAMETHASONE SODIUM PHOSPHATE 4 MG/ML IJ SOLN
INTRAMUSCULAR | Status: AC
  Filled 2013-07-12: qty 1

## 2013-07-12 MED ORDER — ONDANSETRON HCL 4 MG PO TABS: 4.0000 mg | ORAL_TABLET | Freq: Four times a day (QID) | ORAL | Status: DC | PRN

## 2013-07-12 MED ORDER — NEOSTIGMINE METHYLSULFATE 1 MG/ML IJ SOLN
INTRAMUSCULAR | Status: AC
Start: 2013-07-12 — End: 2013-07-12
  Filled 2013-07-12: qty 10

## 2013-07-12 MED ORDER — PHENYLEPHRINE 40 MCG/ML (10ML) SYRINGE FOR IV PUSH (FOR BLOOD PRESSURE SUPPORT)
PREFILLED_SYRINGE | INTRAVENOUS | Status: AC
  Filled 2013-07-12: qty 10

## 2013-07-12 MED ORDER — CEFAZOLIN SODIUM-DEXTROSE 2-3 GM-% IV SOLR
INTRAVENOUS | Status: AC
  Filled 2013-07-12: qty 50

## 2013-07-12 MED ORDER — TRANEXAMIC ACID 100 MG/ML IV SOLN
1000.0000 mg | INTRAVENOUS | Status: DC
  Filled 2013-07-12: qty 10

## 2013-07-12 MED ORDER — METOCLOPRAMIDE HCL 5 MG PO TABS
5.0000 mg | ORAL_TABLET | Freq: Three times a day (TID) | ORAL | Status: DC | PRN
  Filled 2013-07-12: qty 2

## 2013-07-12 MED ORDER — PROPOFOL 10 MG/ML IV BOLUS
INTRAVENOUS | Status: AC
  Filled 2013-07-12: qty 20

## 2013-07-12 MED ORDER — PHENOL 1.4 % MT LIQD: 1.0000 | OROMUCOSAL | Status: DC | PRN

## 2013-07-12 MED ORDER — CEFAZOLIN SODIUM-DEXTROSE 2-3 GM-% IV SOLR
INTRAVENOUS | Status: DC | PRN
  Administered 2013-07-12: 2 g via INTRAVENOUS

## 2013-07-12 MED ORDER — BISACODYL 10 MG RE SUPP
10.0000 mg | Freq: Every day | RECTAL | Status: DC | PRN
  Filled 2013-07-12: qty 1

## 2013-07-12 MED ORDER — FENTANYL CITRATE 0.05 MG/ML IJ SOLN
INTRAMUSCULAR | Status: DC | PRN
  Administered 2013-07-12 (×2): 50 ug via INTRAVENOUS
  Administered 2013-07-12: 100 ug via INTRAVENOUS
  Administered 2013-07-12 (×2): 50 ug via INTRAVENOUS

## 2013-07-12 MED ORDER — MIDAZOLAM HCL 2 MG/2ML IJ SOLN
INTRAMUSCULAR | Status: AC
  Filled 2013-07-12: qty 2

## 2013-07-12 MED ORDER — ROCURONIUM BROMIDE 100 MG/10ML IV SOLN
INTRAVENOUS | Status: DC | PRN
  Administered 2013-07-12: 50 mg via INTRAVENOUS

## 2013-07-12 MED ORDER — PROPOFOL 10 MG/ML IV BOLUS
INTRAVENOUS | Status: DC | PRN
  Administered 2013-07-12: 200 mg via INTRAVENOUS

## 2013-07-12 MED ORDER — ACETAMINOPHEN 650 MG RE SUPP: 650.0000 mg | Freq: Four times a day (QID) | RECTAL | Status: DC | PRN

## 2013-07-12 MED ORDER — SUCCINYLCHOLINE CHLORIDE 20 MG/ML IJ SOLN
INTRAMUSCULAR | Status: AC
  Filled 2013-07-12: qty 1

## 2013-07-12 MED ORDER — METHOCARBAMOL 500 MG PO TABS
500.0000 mg | ORAL_TABLET | Freq: Four times a day (QID) | ORAL | Status: DC | PRN
  Filled 2013-07-12: qty 1

## 2013-07-12 MED ORDER — PROMETHAZINE HCL 25 MG/ML IJ SOLN: 6.2500 mg | INTRAMUSCULAR | Status: DC | PRN

## 2013-07-12 MED ORDER — GLYCOPYRROLATE 0.2 MG/ML IJ SOLN
INTRAMUSCULAR | Status: AC
  Filled 2013-07-12: qty 3

## 2013-07-12 MED ORDER — ONDANSETRON HCL 4 MG/2ML IJ SOLN
INTRAMUSCULAR | Status: AC
  Filled 2013-07-12: qty 2

## 2013-07-12 MED ORDER — ASPIRIN EC 325 MG PO TBEC
325.0000 mg | DELAYED_RELEASE_TABLET | Freq: Two times a day (BID) | ORAL | Status: DC
Start: 2013-07-13 — End: 2013-07-17
  Administered 2013-07-13 – 2013-07-17 (×10): 325 mg via ORAL
  Filled 2013-07-12 (×12): qty 1

## 2013-07-12 MED ORDER — LIDOCAINE HCL (CARDIAC) 20 MG/ML IV SOLN
INTRAVENOUS | Status: DC | PRN
Start: 2013-07-12 — End: 2013-07-12
  Administered 2013-07-12: 40 mg via INTRAVENOUS

## 2013-07-12 MED ORDER — NEOSTIGMINE METHYLSULFATE 1 MG/ML IJ SOLN
INTRAMUSCULAR | Status: DC | PRN
  Administered 2013-07-12: 4 mg via INTRAVENOUS

## 2013-07-12 MED ORDER — BUPIVACAINE HCL (PF) 0.25 % IJ SOLN
INTRAMUSCULAR | Status: AC
  Filled 2013-07-12: qty 30

## 2013-07-12 MED ORDER — NEOSTIGMINE METHYLSULFATE 1 MG/ML IJ SOLN
INTRAMUSCULAR | Status: AC
  Filled 2013-07-12: qty 10

## 2013-07-12 MED ORDER — METOCLOPRAMIDE HCL 5 MG/ML IJ SOLN: 5.0000 mg | Freq: Three times a day (TID) | INTRAMUSCULAR | Status: DC | PRN

## 2013-07-12 MED ORDER — SENNOSIDES-DOCUSATE SODIUM 8.6-50 MG PO TABS
1.0000 | ORAL_TABLET | Freq: Every evening | ORAL | Status: DC | PRN
  Administered 2013-07-16: 1 via ORAL
  Filled 2013-07-12: qty 1

## 2013-07-12 MED ORDER — HYDROMORPHONE HCL PF 1 MG/ML IJ SOLN: 0.2500 mg | INTRAMUSCULAR | Status: DC | PRN

## 2013-07-12 MED ORDER — OXYCODONE HCL 5 MG PO TABS: 5.0000 mg | ORAL_TABLET | Freq: Once | ORAL | Status: DC | PRN

## 2013-07-12 MED ORDER — CIPROFLOXACIN IN D5W 400 MG/200ML IV SOLN
400.0000 mg | Freq: Two times a day (BID) | INTRAVENOUS | Status: DC
  Administered 2013-07-13: 400 mg via INTRAVENOUS
  Filled 2013-07-12 (×4): qty 200

## 2013-07-12 MED ORDER — PHENYLEPHRINE HCL 10 MG/ML IJ SOLN
INTRAMUSCULAR | Status: DC | PRN
  Administered 2013-07-12 (×2): 80 ug via INTRAVENOUS
  Administered 2013-07-12: 40 ug via INTRAVENOUS
  Administered 2013-07-12: 80 ug via INTRAVENOUS
  Administered 2013-07-12: 120 ug via INTRAVENOUS

## 2013-07-12 MED ORDER — DEXAMETHASONE SODIUM PHOSPHATE 4 MG/ML IJ SOLN
INTRAMUSCULAR | Status: DC | PRN
  Administered 2013-07-12: 4 mg via INTRAVENOUS

## 2013-07-12 MED ORDER — DEXTROSE 5 % IV SOLN
500.0000 mg | Freq: Four times a day (QID) | INTRAVENOUS | Status: DC | PRN
  Filled 2013-07-12: qty 5

## 2013-07-12 MED ORDER — ACETAMINOPHEN 325 MG PO TABS
650.0000 mg | ORAL_TABLET | Freq: Four times a day (QID) | ORAL | Status: DC | PRN
  Administered 2013-07-13: 650 mg via ORAL
  Filled 2013-07-12: qty 2

## 2013-07-12 MED ORDER — HYDROCODONE-ACETAMINOPHEN 7.5-325 MG PO TABS
1.0000 | ORAL_TABLET | Freq: Three times a day (TID) | ORAL | Status: DC | PRN
  Administered 2013-07-12 – 2013-07-15 (×4): 1 via ORAL
  Filled 2013-07-12 (×4): qty 1

## 2013-07-12 MED ORDER — LACTATED RINGERS IV SOLN
INTRAVENOUS | Status: DC
  Administered 2013-07-12 – 2013-07-13 (×4): via INTRAVENOUS

## 2013-07-12 MED ORDER — PIPERACILLIN-TAZOBACTAM 3.375 G IVPB
3.3750 g | Freq: Three times a day (TID) | INTRAVENOUS | Status: DC
  Filled 2013-07-12 (×3): qty 50

## 2013-07-12 MED ORDER — GLYCOPYRROLATE 0.2 MG/ML IJ SOLN
INTRAMUSCULAR | Status: DC | PRN
  Administered 2013-07-12: 0.6 mg via INTRAVENOUS

## 2013-07-12 MED ORDER — MENTHOL 3 MG MT LOZG
1.0000 | LOZENGE | OROMUCOSAL | Status: DC | PRN
  Filled 2013-07-12: qty 9

## 2013-07-12 MED ORDER — DOCUSATE SODIUM 100 MG PO CAPS
100.0000 mg | ORAL_CAPSULE | Freq: Two times a day (BID) | ORAL | Status: DC
  Administered 2013-07-12 – 2013-07-17 (×10): 100 mg via ORAL
  Filled 2013-07-12 (×10): qty 1

## 2013-07-12 MED ORDER — OXYCODONE HCL 5 MG/5ML PO SOLN: 5.0000 mg | Freq: Once | ORAL | Status: DC | PRN

## 2013-07-12 MED ORDER — CEFAZOLIN SODIUM-DEXTROSE 2-3 GM-% IV SOLR
2.0000 g | Freq: Four times a day (QID) | INTRAVENOUS | Status: AC
  Administered 2013-07-12 – 2013-07-13 (×2): 2 g via INTRAVENOUS
  Filled 2013-07-12 (×2): qty 50

## 2013-07-12 MED ORDER — LIDOCAINE HCL (CARDIAC) 20 MG/ML IV SOLN
INTRAVENOUS | Status: AC
  Filled 2013-07-12: qty 5

## 2013-07-12 MED ORDER — GLYCOPYRROLATE 0.2 MG/ML IJ SOLN
INTRAMUSCULAR | Status: AC
  Filled 2013-07-12: qty 1

## 2013-07-12 MED ORDER — ONDANSETRON HCL 4 MG/2ML IJ SOLN: 4.0000 mg | Freq: Four times a day (QID) | INTRAMUSCULAR | Status: DC | PRN

## 2013-07-12 MED ORDER — FENTANYL CITRATE 0.05 MG/ML IJ SOLN
INTRAMUSCULAR | Status: AC
  Filled 2013-07-12: qty 5

## 2013-07-12 MED ORDER — FERROUS SULFATE 325 (65 FE) MG PO TABS
325.0000 mg | ORAL_TABLET | Freq: Every day | ORAL | Status: DC
  Administered 2013-07-13 – 2013-07-17 (×5): 325 mg via ORAL
  Filled 2013-07-12 (×7): qty 1

## 2013-07-12 MED ORDER — EPHEDRINE SULFATE 50 MG/ML IJ SOLN
INTRAMUSCULAR | Status: DC | PRN
  Administered 2013-07-12: 10 mg via INTRAVENOUS
  Administered 2013-07-12: 15 mg via INTRAVENOUS
  Administered 2013-07-12: 10 mg via INTRAVENOUS

## 2013-07-12 MED ORDER — 0.9 % SODIUM CHLORIDE (POUR BTL) OPTIME
TOPICAL | Status: DC | PRN
  Administered 2013-07-12: 1000 mL

## 2013-07-12 MED ORDER — SODIUM CHLORIDE 0.9 % IV SOLN
INTRAVENOUS | Status: DC | PRN
  Administered 2013-07-12: 19:00:00 via INTRAVENOUS

## 2013-07-12 MED ORDER — ONDANSETRON HCL 4 MG/2ML IJ SOLN
INTRAMUSCULAR | Status: DC | PRN
  Administered 2013-07-12: 4 mg via INTRAVENOUS

## 2013-07-12 SURGICAL SUPPLY — 86 items
BANDAGE ELASTIC 4 VELCRO ST LF (GAUZE/BANDAGES/DRESSINGS) ×3 IMPLANT
BANDAGE ELASTIC 6 VELCRO ST LF (GAUZE/BANDAGES/DRESSINGS) ×3 IMPLANT
BANDAGE GAUZE ELAST BULKY 4 IN (GAUZE/BANDAGES/DRESSINGS) ×3 IMPLANT
BIT DRILL 4.3 (BIT) ×1 IMPLANT
BIT DRILL QC 3.3X195 (BIT) ×3 IMPLANT
BLADE SURG 10 STRL SS (BLADE) ×3 IMPLANT
BLADE SURG 15 STRL LF DISP TIS (BLADE) ×1 IMPLANT
BLADE SURG 15 STRL SS (BLADE) ×2
BLADE SURG ROTATE 9660 (MISCELLANEOUS) IMPLANT
BRUSH SCRUB DISP (MISCELLANEOUS) ×6 IMPLANT
CABLE (Orthopedic Implant) ×3 IMPLANT
CAP LOCK NCB (Cap) ×10 IMPLANT
CLEANER TIP ELECTROSURG 2X2 (MISCELLANEOUS) ×3 IMPLANT
CLOTH BEACON ORANGE TIMEOUT ST (SAFETY) ×3 IMPLANT
COVER MAYO STAND STRL (DRAPES) ×3 IMPLANT
DRAPE C-ARM 42X72 X-RAY (DRAPES) ×3 IMPLANT
DRAPE C-ARMOR (DRAPES) ×3 IMPLANT
DRAPE INCISE IOBAN 66X45 STRL (DRAPES) ×3 IMPLANT
DRAPE ORTHO SPLIT 77X108 STRL (DRAPES)
DRAPE SURG ORHT 6 SPLT 77X108 (DRAPES) IMPLANT
DRAPE U-SHAPE 47X51 STRL (DRAPES) ×3 IMPLANT
DRILL BIT 4.3 (BIT) ×2
DRSG ADAPTIC 3X8 NADH LF (GAUZE/BANDAGES/DRESSINGS) ×3 IMPLANT
DRSG PAD ABDOMINAL 8X10 ST (GAUZE/BANDAGES/DRESSINGS) ×3 IMPLANT
ELECT REM PT RETURN 9FT ADLT (ELECTROSURGICAL) ×3
ELECTRODE REM PT RTRN 9FT ADLT (ELECTROSURGICAL) ×1 IMPLANT
EVACUATOR 1/8 PVC DRAIN (DRAIN) IMPLANT
EVACUATOR 3/16  PVC DRAIN (DRAIN)
EVACUATOR 3/16 PVC DRAIN (DRAIN) IMPLANT
GLOVE BIO SURGEON STRL SZ7 (GLOVE) ×3 IMPLANT
GLOVE BIO SURGEON STRL SZ7.5 (GLOVE) ×12 IMPLANT
GLOVE BIO SURGEON STRL SZ8.5 (GLOVE) ×3 IMPLANT
GLOVE BIOGEL PI IND STRL 7.0 (GLOVE) ×3 IMPLANT
GLOVE BIOGEL PI IND STRL 8 (GLOVE) ×1 IMPLANT
GLOVE BIOGEL PI IND STRL 8.5 (GLOVE) ×1 IMPLANT
GLOVE BIOGEL PI INDICATOR 7.0 (GLOVE) ×6
GLOVE BIOGEL PI INDICATOR 8 (GLOVE) ×2
GLOVE BIOGEL PI INDICATOR 8.5 (GLOVE) ×2
GLOVE SS BIOGEL STRL SZ 8 (GLOVE) ×1 IMPLANT
GLOVE SUPERSENSE BIOGEL SZ 8 (GLOVE) ×2
GOWN STRL NON-REIN LRG LVL3 (GOWN DISPOSABLE) ×12 IMPLANT
GOWN STRL REUS W/TWL 2XL LVL3 (GOWN DISPOSABLE) ×3 IMPLANT
HEX DRIVE 2.5 (BIT) ×3 IMPLANT
IMMOBILIZER KNEE 22 UNIV (SOFTGOODS) ×3 IMPLANT
KIT BASIN OR (CUSTOM PROCEDURE TRAY) ×3 IMPLANT
KIT ROOM TURNOVER OR (KITS) ×3 IMPLANT
MANIFOLD NEPTUNE II (INSTRUMENTS) ×3 IMPLANT
NEEDLE 22X1 1/2 (OR ONLY) (NEEDLE) IMPLANT
NS IRRIG 1000ML POUR BTL (IV SOLUTION) ×3 IMPLANT
PACK ORTHO EXTREMITY (CUSTOM PROCEDURE TRAY) ×3 IMPLANT
PAD ARMBOARD 7.5X6 YLW CONV (MISCELLANEOUS) ×6 IMPLANT
PAD CAST 4YDX4 CTTN HI CHSV (CAST SUPPLIES) ×1 IMPLANT
PADDING CAST COTTON 4X4 STRL (CAST SUPPLIES) ×2
PADDING CAST COTTON 6X4 STRL (CAST SUPPLIES) ×3 IMPLANT
PLATE PERIPROS RT 12H DIST FEM (Plate) ×3 IMPLANT
SCREW 5.0 48MM (Screw) ×3 IMPLANT
SCREW 5.0 70MM (Screw) ×3 IMPLANT
SCREW 5.0 80MM (Screw) ×3 IMPLANT
SCREW CORT NCB SELFTAP 5.0X42 (Screw) ×3 IMPLANT
SCREW CORT NCB SELFTAP 5.0X50 (Screw) ×3 IMPLANT
SCREW NCB 4.0MX46M (Screw) ×3 IMPLANT
SCREW NCB 5.0X34MM (Screw) ×6 IMPLANT
SCREW NCB 5.0X36MM (Screw) ×3 IMPLANT
SPONGE GAUZE 4X4 12PLY (GAUZE/BANDAGES/DRESSINGS) ×3 IMPLANT
SPONGE LAP 18X18 X RAY DECT (DISPOSABLE) ×3 IMPLANT
STAPLER VISISTAT 35W (STAPLE) ×3 IMPLANT
STOCKINETTE IMPERVIOUS LG (DRAPES) ×3 IMPLANT
SUCTION FRAZIER TIP 10 FR DISP (SUCTIONS) ×3 IMPLANT
SUT ETHIBOND 2 0 V5 (SUTURE) ×3 IMPLANT
SUT PDS AB 2-0 CT1 27 (SUTURE) IMPLANT
SUT VIC AB 0 CT1 27 (SUTURE) ×4
SUT VIC AB 0 CT1 27XBRD ANBCTR (SUTURE) ×2 IMPLANT
SUT VIC AB 1 CT1 27 (SUTURE) ×4
SUT VIC AB 1 CT1 27XBRD ANBCTR (SUTURE) ×2 IMPLANT
SUT VIC AB 2-0 CT1 27 (SUTURE) ×4
SUT VIC AB 2-0 CT1 TAPERPNT 27 (SUTURE) ×2 IMPLANT
SYR 20ML ECCENTRIC (SYRINGE) IMPLANT
TOWEL OR 17X24 6PK STRL BLUE (TOWEL DISPOSABLE) ×3 IMPLANT
TOWEL OR 17X26 10 PK STRL BLUE (TOWEL DISPOSABLE) ×6 IMPLANT
TRAY FOLEY CATH 16FRSI W/METER (SET/KITS/TRAYS/PACK) IMPLANT
TUBE CONNECTING 12'X1/4 (SUCTIONS) ×1
TUBE CONNECTING 12X1/4 (SUCTIONS) ×2 IMPLANT
WATER STERILE IRR 1000ML POUR (IV SOLUTION) ×6 IMPLANT
YANKAUER SUCT BULB TIP NO VENT (SUCTIONS) ×3 IMPLANT
cable button ×2 IMPLANT
locking cap ×15 IMPLANT

## 2013-07-12 NOTE — Interval H&P Note (Signed)
History and Physical Interval Note:  07/12/2013 3:16 PM  Holly Banks  has presented today for surgery, with the diagnosis of distal femur fracture  The various methods of treatment have been discussed with the patient and family. After consideration of risks, benefits and other options for treatment, the patient has consented to  Procedure(s): OPEN REDUCTION INTERNAL FIXATION (ORIF) DISTAL FEMUR FRACTURE (N/A) as a surgical intervention .  The patient's history has been reviewed, patient examined, no change in status, stable for surgery.  I have reviewed the patient's chart and labs.  Questions were answered to the patient's satisfaction.     Koji Niehoff G

## 2013-07-12 NOTE — Progress Notes (Signed)
Clinical Social Work Department BRIEF PSYCHOSOCIAL ASSESSMENT 07/12/2013  Patient:  Holly Banks,Holly Banks     Account Number:  [id number]     Admit date:  07/11/2013  Clinical Social Worker:  HAIDINGER,JAMIE, LCSW  Date/Time:  07/12/2013 11:51 AM  Referred by:  CSW  Date Referred:  07/12/2013 Referred for  SNF Placement   Other Referral:   Interview type:  Patient Other interview type:    PSYCHOSOCIAL DATA Living Status:  FAMILY Admitted from facility:   Level of care:   Primary support name:  Dorothea Gibbs Primary support relationship to patient:  CHILD, ADULT Degree of support available:   unclear    CURRENT CONCERNS Current Concerns  Post-Acute Placement   Other Concerns:    SOCIAL WORK ASSESSMENT / PLAN Pt is an 80 yr old female living at home with grandson prior to hospitalization. Pt fell at home yesterday and fx her hip. She will be trans to [facility] today for hip surgery. CSW met with pt to offer support and assist with d/c planning. D/C options were reviewed with pt. Pt feels rehab will be needed following surgery but would like her family to assist with planning. Pt did give CSW permission to initiate SNF search in case placement was needed.   Assessment/plan status:  Psychosocial Support/Ongoing Assessment of Needs Other assessment/ plan:   Information/referral to community resources:   SNF list with bed offers to be provided. CSW to review insurance coverage for SNF with pt / family.    PATIENT'S/FAMILY'S RESPONSE TO PLAN OF CARE: Pt feels she will need rehab. She was alone when she fell which was  frightening. Fortunately her grandson came home and was able to assist. D/C plans are incomplete at this time.    Jamie Haidinger LCSW [phone]    

## 2013-07-12 NOTE — Anesthesia Procedure Notes (Signed)
Procedure Name: Intubation Date/Time: 07/12/2013 4:04 PM Performed by: Carola Frost Pre-anesthesia Checklist: Patient identified, Timeout performed, Emergency Drugs available, Suction available and Patient being monitored Patient Re-evaluated:Patient Re-evaluated prior to inductionOxygen Delivery Method: Circle system utilized Preoxygenation: Pre-oxygenation with 100% oxygen Intubation Type: IV induction Ventilation: Mask ventilation without difficulty and Oral airway inserted - appropriate to patient size Laryngoscope Size: Mac and 3 Grade View: Grade I Tube type: Oral Tube size: 7.5 mm Number of attempts: 1 Airway Equipment and Method: Stylet Placement Confirmation: positive ETCO2,  ETT inserted through vocal cords under direct vision and breath sounds checked- equal and bilateral Secured at: 22 cm Tube secured with: Tape Dental Injury: Teeth and Oropharynx as per pre-operative assessment

## 2013-07-12 NOTE — Progress Notes (Signed)
Pt PCR positive for Both Satph aureus and MRSA. Placed on CONTACT precautions

## 2013-07-12 NOTE — Brief Op Note (Signed)
Holly Banks [id number] 07/12/2013   PRE-OP DIAGNOSIS: right distal femur fracture  POST-OP DIAGNOSIS: same  PROCEDURE: right distal femur ORIF  ANESTHESIA: general  [name]   Dictation #:  [id number]

## 2013-07-12 NOTE — Anesthesia Postprocedure Evaluation (Signed)
  Anesthesia Post-op Note  Patient: Holly Banks  Procedure(s) Performed: Procedure(s): OPEN REDUCTION INTERNAL FIXATION (ORIF) DISTAL FEMUR FRACTURE (N/A)  Patient Location: PACU  Anesthesia Type:General  Level of Consciousness: awake  Airway and Oxygen Therapy: Patient Spontanous Breathing  Post-op Pain: mild  Post-op Assessment: Post-op Vital signs reviewed  Post-op Vital Signs: Reviewed  Complications: No apparent anesthesia complications

## 2013-07-12 NOTE — Preoperative (Signed)
Beta Blockers   Reason not to administer Beta Blockers:Not Applicable 

## 2013-07-12 NOTE — Progress Notes (Addendum)
ANTIBIOTIC CONSULT NOTE - INITIAL  Pharmacy Consult for cipro Indication: UTI  Allergies  Allergen Reactions  . Penicillins     Patient Measurements: Height: 5\' 3"  (160 cm) Weight: 239 lb (108.41 kg) IBW/kg (Calculated) : 52.4   Vital Signs: Temp: 98.8 F (37.1 C) (02/05 1312) Temp src: Oral (02/05 1312) BP: 169/78 mmHg (02/05 1312) Pulse Rate: 67 (02/05 1312) Intake/Output from previous day: 02/04 0701 - 02/05 0700 In: 240 [P.O.:240] Out: 1850 [Urine:1850] Intake/Output from this shift: Total I/O In: -  Out: 300 [Urine:300]  Labs:  Recent Labs  07/11/13 1345 07/11/13 1510 07/12/13 0413  WBC 6.0  --  6.7  HGB 10.5*  --  9.3*  PLT 256  --  166  CREATININE  --  0.61  --    Estimated Creatinine Clearance: 64 ml/min (by C-G formula based on Cr of 0.61). No results found for this basename: Letta Median, VANCORANDOM, GENTTROUGH, GENTPEAK, GENTRANDOM, TOBRATROUGH, TOBRAPEAK, TOBRARND, AMIKACINPEAK, AMIKACINTROU, AMIKACIN,  in the last 72 hours   Microbiology: Recent Results (from the past 720 hour(s))  URINE CULTURE     Status: None   Collection Time    07/11/13  4:21 PM      Result Value Range Status   Specimen Description URINE, CATHETERIZED   Final   Special Requests NONE   Final   Culture  Setup Time     Final   Value: 07/11/2013 21:02     Performed at [place]     Final   Value: >=100,000 COLONIES/ML     Performed at [facility]   Culture     Final   Value: [place]     Performed at [facility]   Report Status PENDING   Incomplete  SURGICAL PCR SCREEN     Status: Abnormal   Collection Time    07/11/13  8:58 PM      Result Value Range Status   MRSA, PCR POSITIVE (*) NEGATIVE Final   Comment: RESULT CALLED TO, READ BACK BY AND VERIFIED WITH:     CRUTCHFIELD,J RN @2337  ON 02.04.2015 BY MCREYNOLDS,B   Staphylococcus aureus POSITIVE (*) NEGATIVE Final   Comment:            The Xpert SA  Assay (FDA     approved for NASAL specimens     in patients over 59 years of age),     is one component of     a comprehensive surveillance     program.  Test performance has     been validated by [employer] for patients greater     than or equal to 44 year old.     It is not intended     to diagnose infection nor to     guide or monitor treatment.    Medical History: Past Medical History  Diagnosis Date  . Leiomyoma 2-02    colonic polypoid   . Diabetes mellitus     type II  . GERD (gastroesophageal reflux disease)   . Gout   . Hypertension   . Osteoarthritis   . Urinary incontinence   . Morbid obesity   . Pain in joint, lower leg   . Unspecified vitamin D deficiency   . Hyperkalemia   . Gastritis     w/ hx of h. pylori  . Diverticulosis     Medications:  Prescriptions prior to admission  Medication Sig  Dispense Refill  . allopurinol (ZYLOPRIM) 300 MG tablet Take 1 tablet (300 mg total) by mouth daily.  90 tablet  3  . aspirin 81 MG chewable tablet Chew 81 mg by mouth daily.        . Cholecalciferol (EQL VITAMIN D3) 1000 UNITS tablet Take 1,000 Units by mouth daily.        [name] darifenacin (ENABLEX) 7.5 MG 24 hr tablet Take 1 tablet (7.5 mg total) by mouth daily.  30 tablet  11  . glimepiride (AMARYL) 4 MG tablet Take 1 tablet (4 mg total) by mouth 2 (two) times daily.  180 tablet  3  . hydrOXYzine (ATARAX/VISTARIL) 10 MG tablet Take 10 mg by mouth 3 (three) times daily as needed.      [name] ibuprofen (ADVIL,MOTRIN) 200 MG tablet Take 400 mg by mouth every 6 (six) hours as needed.      . loratadine (CLARITIN) 10 MG tablet Take 10 mg by mouth daily.      [name] losartan (COZAAR) 50 MG tablet Take 1 tablet (50 mg total) by mouth daily.  90 tablet  3  . metFORMIN (GLUCOPHAGE) 500 MG tablet Take 1 tablet (500 mg total) by mouth 2 (two) times daily with a meal.  180 tablet  3  . potassium chloride (KLOR-CON 10) 10 MEQ tablet Take 1 tablet (10 mEq total) by mouth daily.  90 tablet   3  . verapamil (VERELAN PM) 360 MG 24 hr capsule Take 1 capsule (360 mg total) by mouth at bedtime.  90 capsule  3  . vitamin B-12 (CYANOCOBALAMIN) 1000 MCG tablet Take 1,000 mcg by mouth daily.        [name] HYDROcodone-acetaminophen (NORCO) 7.5-325 MG per tablet Take 0.5-1 tablets by mouth every 8 (eight) hours as needed. Please fill on or after 05/15/13  90 tablet  0   Assessment: 80 yo lady to start cipro for UTI.  Her CrCl~64 ml/min  Goal of Therapy:  Eradication of infection  Plan:  Cipro 400 mg IV q12 hours F/u cultures  Lenice Koper Poteet 07/12/2013,4:00 PM

## 2013-07-12 NOTE — Consult Note (Addendum)
[facility]  PROGRESS NOTE   Holly Banks NOM:[id number] DOB: [birth year] DOA: (Not on file)  PCP: [name], MD   Brief narrative:  80 year old morbidly obese female with past medical history significant for diabetes mellitus, hypertension GERD, gout, who presents status post fall (presented to [facility] ED 07/11/2013. She was found to have right comminuted fracture of the distal femur with fracture fragment extending toward but not abutting the total knee prosthesis.   Impression/Recommendations   Active Problems:  UTI - urine culture growing gram negative rods - reasonable to treat with cipro until sensitivity report comes back. May have total of 5 days of antibiotics. Diabetes mellitus  - CBG's in past 24 hours: 145, 136, 120  - once has POi intake may resume home regimen  - will place order for diabetic coordinator for further input  Hypertension  -Continue verapamil 360 mg daily GERD  - Placed on PPI  History of gout  - Continue allopurinol  Normocytic Anemia  - Likely chronic disease, no gross bleeding  Fracture of femur, right, closed  - Per primary team    * will not follow up, please call floor manager at [phone] if [place] needed to see the pt again during this hospital stay. Thank you.    Code Status: full code  Family Communication: no family at the bedside  Disposition Plan: per primary team    Leisa Lenz, MD  [facility]  Pager [phone]   If 7PM-7AM, please contact night-coverage  [email or web address]  Password [email or web address]  07/12/2013, 1:44 PM   HPI/Subjective:  No acute overnight events   Objective:  BP 169/78 HR 67, T 98.8 F, O2 sat 98%  Exam:  General: Pt is not in acute distress  Cardiovascular: Regular rate and rhythm, S1/S2 appreciated  Respiratory: Clear to auscultation bilaterally, no wheezing, no crackles, no rhonchi  Abdomen: Soft, non tender, non distended, bowel sounds present, no guarding  Extremities: Pulses DP and PT palpable  bilaterally  Neuro: Grossly nonfocal   Data Reviewed:  Basic Metabolic Panel:  Recent Labs  Lab  07/11/13 1510  07/11/13 1605   NA  137  --   K  6.0*  3.9   CL  100  --   CO2  27  --   GLUCOSE  158*  --   BUN  14  --   CREATININE  0.61  --   CALCIUM  9.1  --     CBC:  Recent Labs  Lab  07/11/13 1345  07/12/13 0413   WBC  6.0  6.7   NEUTROABS  4.4  --   HGB  10.5*  9.3*   HCT  32.3*  29.2*   MCV  80.8  82.5   PLT  256  166    CBG:  Recent Labs  Lab  07/11/13 1754  07/11/13 2304  07/12/13 0806  07/12/13 1314   GLUCAP  138*  145*  136*  120*    Recent Results (from the past 240 hour(s))   URINE CULTURE Status: None    Collection Time    07/11/13 4:21 PM   Result  Value  Range  Status    Specimen Description  URINE, CATHETERIZED   Final    Special Requests  NONE   Final    Culture Setup Time    Final    Value:  07/11/2013 21:02     Performed at [place]    Final  Value:  >=100,000 COLONIES/ML     Performed at [facility]    Culture    Final    Value:  [place]     Performed at [facility]    Report Status  PENDING   Incomplete   SURGICAL PCR SCREEN Status: Abnormal    Collection Time    19-Jul-2013 8:58 PM   Result  Value  Range  Status    MRSA, PCR  POSITIVE (*)  NEGATIVE  Final    Comment:  RESULT CALLED TO, READ BACK BY AND VERIFIED WITH:     CRUTCHFIELD,J RN @2337  ON 2013-07-19 BY MCREYNOLDS,B    Staphylococcus aureus  POSITIVE (*)  NEGATIVE  Final    Comment:      The Xpert SA Assay (FDA     approved for NASAL specimens     in patients over 44 years of age),     is one component of     a comprehensive surveillance     program. Test performance has     been validated by [employer] for patients greater     than or equal to 10 year old.     It is not intended     to diagnose infection nor to     guide or monitor treatment.   Studies:  Dg Hip Complete Right  07/19/13 IMPRESSION:  Comminuted displaced distal right femoral metaphyseal fracture better visualized and reported on right knee radiograph exam. Bilateral hip prostheses. Diffuse osseous demineralization. No acute proximal right femoral abnormality.   Dg Femur Right  2013/07/19  IMPRESSION: Comminuted fracture distal femur with fracture fragment extending toward but not abutting the total knee prosthesis. Posteriorly, the distal fracture fragment does extend into the distal metaphysis of the femur. No dislocation.   Dg Chest Port 1 View  07/19/2013  IMPRESSION: Enlargement of cardiac silhouette. No acute abnormalities.   Dg Knee Complete 4 Views Right  July 19, 2013  IMPRESSION: Comminuted oblique displaced distal right femoral metadiaphyseal fracture. Osseous demineralization with note of components of a right knee prosthesis.

## 2013-07-12 NOTE — Transfer of Care (Signed)
Immediate Anesthesia Transfer of Care Note  Patient: Holly Banks  Procedure(s) Performed: Procedure(s): OPEN REDUCTION INTERNAL FIXATION (ORIF) DISTAL FEMUR FRACTURE (N/A)  Patient Location: PACU  Anesthesia Type:General  Level of Consciousness: awake and patient cooperative  Airway & Oxygen Therapy: Patient Spontanous Breathing and Patient connected to nasal cannula oxygen  Post-op Assessment: Report given to PACU RN and Post -op Vital signs reviewed and stable  Post vital signs: Reviewed and stable  Complications: No apparent anesthesia complications

## 2013-07-12 NOTE — Discharge Instructions (Signed)
NWB right side Change dressing as tolerated ASA 325mg  twice daily for one month Return to office in two weeks

## 2013-07-12 NOTE — Anesthesia Preprocedure Evaluation (Addendum)
Anesthesia Evaluation  Patient identified by MRN, date of birth, ID band Patient awake    Reviewed: Allergy & Precautions, H&P , NPO status , Patient's Chart, lab work & pertinent test results  History of Anesthesia Complications Negative for: history of anesthetic complications  Airway Mallampati: II TM Distance: >3 FB Neck ROM: Full    Dental  (+) Upper Dentures, Lower Dentures and Dental Advisory Given   Pulmonary former smoker,          Cardiovascular hypertension, Pt. on medications     Neuro/Psych negative neurological ROS  negative psych ROS   GI/Hepatic Neg liver ROS, GERD-  Medicated,  Endo/Other  diabetes  Renal/GU negative Renal ROS     Musculoskeletal   Abdominal   Peds  Hematology   Anesthesia Other Findings   Reproductive/Obstetrics                          Anesthesia Physical Anesthesia Plan  ASA: III  Anesthesia Plan: General   Post-op Pain Management:    Induction: Intravenous  Airway Management Planned: Oral ETT  Additional Equipment:   Intra-op Plan:   Post-operative Plan: Extubation in OR  Informed Consent:   Plan Discussed with: CRNA, Anesthesiologist and Surgeon  Anesthesia Plan Comments:         Anesthesia Quick Evaluation

## 2013-07-12 NOTE — Progress Notes (Unsigned)
[facility] PROGRESS NOTE  Holly Banks FUX:[id number] DOB: 10-23-1928 DOA: (Not on file) PCP: [name], MD  Brief narrative: 80 year old morbidly obese female with past medical history significant for diabetes mellitus, hypertension GERD, gout, who presents status post fall (presented to [facility] ED 07/11/2013.  She was found to have right comminuted fracture of the distal femur with fracture fragment extending toward but not abutting the total knee prosthesis.   Impression/Recommendations  Active Problems:  Diabetes mellitus  - CBG's in past 24 hours: 145, 136, 120 - once has POi intake may resume home regimen - will place order for diabetic coordinator for further input   Hypertension  -Continue verapamil 360 mg daily provided BP is at least 135/85 GERD  - Placed on PPI  History of gout  - Continue allopurinol  Normocytic Anemia  - Likely chronic disease, no gross bleeding   Fracture of femur, right, closed  - Per primary team    * will not follow up, please call floor manager at [phone] if [place] needed to see the pt again during this hospital stay. Thank you.  Code Status: full code Family Communication: no family at the bedside Disposition Plan: per primary team  Leisa Lenz, MD  [facility] Pager [phone]  If 7PM-7AM, please contact night-coverage [email or web address] Password [email or web address] 07/12/2013, 1:44 PM   Consultants:  ***  Procedures:  ***  Antibiotics:  ***  HPI/Subjective: No acute overnight events  Objective: There were no vitals filed for this visit.  Exam:   General:  Pt is not in acute distress  Cardiovascular: Regular rate and rhythm, S1/S2 appreciated   Respiratory: Clear to auscultation bilaterally, no wheezing, no crackles, no rhonchi  Abdomen: Soft, non tender, non distended, bowel sounds present, no guarding  Extremities: Pulses DP and PT palpable bilaterally  Neuro: Grossly nonfocal  Data Reviewed: Basic Metabolic  Panel:  Recent Labs Lab 07/11/13 1510 07/11/13 1605  NA 137  --   K 6.0* 3.9  CL 100  --   CO2 27  --   GLUCOSE 158*  --   BUN 14  --   CREATININE 0.61  --   CALCIUM 9.1  --    Liver Function Tests: No results found for this basename: AST, ALT, ALKPHOS, BILITOT, PROT, ALBUMIN,  in the last 168 hours No results found for this basename: LIPASE, AMYLASE,  in the last 168 hours No results found for this basename: AMMONIA,  in the last 168 hours CBC:  Recent Labs Lab 07/11/13 1345 07/12/13 0413  WBC 6.0 6.7  NEUTROABS 4.4  --   HGB 10.5* 9.3*  HCT 32.3* 29.2*  MCV 80.8 82.5  PLT 256 166   Cardiac Enzymes: No results found for this basename: CKTOTAL, CKMB, CKMBINDEX, TROPONINI,  in the last 168 hours BNP: No components found with this basename: POCBNP,  CBG:  Recent Labs Lab 07/11/13 1754 07/11/13 2304 07/12/13 0806 07/12/13 1314  GLUCAP 138* 145* 136* 120*    Recent Results (from the past 240 hour(s))  URINE CULTURE     Status: None   Collection Time    07/11/13  4:21 PM      Result Value Range Status   Specimen Description URINE, CATHETERIZED   Final   Special Requests NONE   Final   Culture  Setup Time     Final   Value: 07/11/2013 21:02     Performed at [place]     Final  Value: >=100,000 COLONIES/ML     Performed at [facility]   Culture     Final   Value: [place]     Performed at [facility]   Report Status PENDING   Incomplete  SURGICAL PCR SCREEN     Status: Abnormal   Collection Time    07/11/13  8:58 PM      Result Value Range Status   MRSA, PCR POSITIVE (*) NEGATIVE Final   Comment: RESULT CALLED TO, READ BACK BY AND VERIFIED WITH:     CRUTCHFIELD,J RN @2337  ON 02.04.2015 BY MCREYNOLDS,B   Staphylococcus aureus POSITIVE (*) NEGATIVE Final   Comment:            The Xpert SA Assay (FDA     approved for NASAL specimens     in patients over 75 years of age),     is one component of      a comprehensive surveillance     program.  Test performance has     been validated by [employer] for patients greater     than or equal to 62 year old.     It is not intended     to diagnose infection nor to     guide or monitor treatment.     Studies: Dg Hip Complete Right  07/11/2013   CLINICAL DATA:  Trauma, fall  EXAM: RIGHT HIP - COMPLETE 2+ VIEW  COMPARISON:  Right knee radiographs 07/11/2013  FINDINGS: Bilateral prostheses.  Bones demineralized.  Large calcified uterine leiomyoma in pelvis, 10.2 x 6.1 cm.  Extensive atherosclerotic calcification in numerous pelvic phleboliths.  Comminuted oblique distal right femoral meta diaphyseal fracture identified at the margin of the exam, more occluding delineated and reported on knee radiographs.  Proximal right femur appears intact.  IMPRESSION: Comminuted displaced distal right femoral metaphyseal fracture better visualized and reported on right knee radiograph exam.  Bilateral hip prostheses.  Diffuse osseous demineralization.  No acute proximal right femoral abnormality.   Electronically Signed   By: Lavonia Dana M.D.   On: 07/11/2013 13:10   Dg Femur Right  07/11/2013   CLINICAL DATA:  Pain post trauma  EXAM: RIGHT FEMUR - 2 VIEW  COMPARISON:  Right knee July 11, 2013  FINDINGS: Frontal, oblique, and lateral images were obtained. There is a comminuted fracture of the distal femoral diaphysis with posterior and medial displacement of the more posteromedial fracture fragment. The fracture does not extend into the space occupied by the femoral component of a total knee replacement. The distal aspect of the fracture is near the junction with the prosthesis, however. There is also a total hip prosthesis.  No dislocations. No other fractures. There is extensive arterial vascular calcification.  IMPRESSION: Comminuted fracture distal femur with fracture fragment extending toward but not abutting the total knee prosthesis. Posteriorly, the distal  fracture fragment does extend into the distal metaphysis of the femur. No dislocation.   Electronically Signed   By: Lowella Grip M.D.   On: 07/11/2013 14:42   Dg Chest Port 1 View  07/11/2013   CLINICAL DATA:  Leg fracture, hypertension, diabetes  EXAM: PORTABLE CHEST - 1 VIEW  COMPARISON:  Portable exam 1339 hr compared to 03/16/2011  FINDINGS: Enlargement of cardiac silhouette.  Tortuous aorta.  Prominent right superior mediastinal soft tissues unchanged.  Pulmonary vascularity normal.  Lungs clear.  No pleural effusion or pneumothorax.  Scattered endplate spur formation thoracic spine.  Bilateral glenohumeral degenerative changes and probable chronic rotator cuff tears.  IMPRESSION: Enlargement of cardiac silhouette.  No acute abnormalities.   Electronically Signed   By: Lavonia Dana M.D.   On: 07/11/2013 13:49   Dg Knee Complete 4 Views Right  07/11/2013   CLINICAL DATA:  Trauma, fall  EXAM: RIGHT KNEE - COMPLETE 4+ VIEW  COMPARISON:  None  FINDINGS: Components of a right knee prosthesis are identified in expected positions.  Bones appear diffusely demineralized.  Oblique comminuted fracture distal right femoral metadiaphysis, displaced posteriorly and slightly laterally with overriding.  Fracture planes extend to near but not definitely to the femoral component of the right knee prosthesis.  No additional fracture or dislocation.  Extensive atherosclerotic calcification.  IMPRESSION: Comminuted oblique displaced distal right femoral metadiaphyseal fracture.  Osseous demineralization with note of components of a right knee prosthesis.   Electronically Signed   By: Lavonia Dana M.D.   On: 07/11/2013 13:08    Scheduled Meds: Continuous Infusions:

## 2013-07-13 ENCOUNTER — Inpatient Hospital Stay (HOSPITAL_COMMUNITY): Payer: Medicare PPO

## 2013-07-13 ENCOUNTER — Encounter (HOSPITAL_COMMUNITY): Payer: Self-pay | Admitting: *Deleted

## 2013-07-13 LAB — CBC
HEMATOCRIT: 31.5 % — AB (ref 36.0–46.0)
Hemoglobin: 10.7 g/dL — ABNORMAL LOW (ref 12.0–15.0)
MCH: 27.9 pg (ref 26.0–34.0)
MCHC: 34 g/dL (ref 30.0–36.0)
MCV: 82 fL (ref 78.0–100.0)
Platelets: 132 10*3/uL — ABNORMAL LOW (ref 150–400)
RBC: 3.84 MIL/uL — ABNORMAL LOW (ref 3.87–5.11)
RDW: 14.5 % (ref 11.5–15.5)
WBC: 10.6 10*3/uL — ABNORMAL HIGH (ref 4.0–10.5)

## 2013-07-13 LAB — TYPE AND SCREEN
ABO/RH(D): A POS
ANTIBODY SCREEN: NEGATIVE
Unit division: 0
Unit division: 0

## 2013-07-13 LAB — BASIC METABOLIC PANEL
BUN: 17 mg/dL (ref 6–23)
CALCIUM: 8.6 mg/dL (ref 8.4–10.5)
CO2: 22 meq/L (ref 19–32)
CREATININE: 0.76 mg/dL (ref 0.50–1.10)
Chloride: 104 mEq/L (ref 96–112)
GFR calc Af Amer: 89 mL/min — ABNORMAL LOW (ref >90)
GFR calc non Af Amer: 76 mL/min — ABNORMAL LOW (ref >90)
Glucose, Bld: 226 mg/dL — ABNORMAL HIGH (ref 70–99)
Potassium: 4.3 mEq/L (ref 3.7–5.3)
Sodium: 139 mEq/L (ref 137–147)

## 2013-07-13 LAB — GLUCOSE, CAPILLARY
Glucose-Capillary: 185 mg/dL — ABNORMAL HIGH (ref 70–99)
Glucose-Capillary: 179 mg/dL — ABNORMAL HIGH (ref 70–99)
Glucose-Capillary: 127 mg/dL — ABNORMAL HIGH (ref 70–99)
Glucose-Capillary: 155 mg/dL — ABNORMAL HIGH (ref 70–99)

## 2013-07-13 MED ORDER — CIPROFLOXACIN HCL 500 MG PO TABS
500.0000 mg | ORAL_TABLET | Freq: Two times a day (BID) | ORAL | Status: DC
  Administered 2013-07-13 – 2013-07-17 (×8): 500 mg via ORAL
  Filled 2013-07-13 (×10): qty 1

## 2013-07-13 NOTE — Progress Notes (Signed)
UR completed.  Leyana Whidden, RN BSN MHA CCM Trauma/Neuro ICU Case Manager [phone]  

## 2013-07-13 NOTE — Progress Notes (Signed)
Subjective: 1 Day Post-Op Procedure(s) (LRB): OPEN REDUCTION INTERNAL FIXATION (ORIF) DISTAL FEMUR FRACTURE (N/A) Currently at bed rest but may be nonweightbearing right side with therapy. Anticipate SNF on discharge. Complaining of minimal pain. Activity level:  Nonweightbearing right side knee brace on at all times. Diet tolerance:  Okay Voiding:  Catheter to be removed today. Patient reports pain as 2 on 0-10 scale.    Objective: Vital signs in last 24 hours: Temp:  [98 F (36.7 C)-99.1 F (37.3 C)] 98.9 F (37.2 C) (02/06 0600) Pulse Rate:  [67-94] 91 (02/06 0600) Resp:  [15-18] 18 (02/06 0800) BP: (112-169)/(45-78) 126/51 mmHg (02/06 0600) SpO2:  [90 %-100 %] 97 % (02/06 0800)  Labs:  Recent Labs  07/11/13 1345 07/12/13 0413 07/13/13 0345  HGB 10.5* 9.3* 10.7*    Recent Labs  07/12/13 0413 07/13/13 0345  WBC 6.7 10.6*  RBC 3.54* 3.84*  HCT 29.2* 31.5*  PLT 166 132*    Recent Labs  07/11/13 1510 07/11/13 1605 07/13/13 0345  NA 137  --  139  K 6.0* 3.9 4.3  CL 100  --  104  CO2 27  --  22  BUN 14  --  17  CREATININE 0.61  --  0.76  GLUCOSE 158*  --  226*  CALCIUM 9.1  --  8.6   No results found for this basename: LABPT, INR,  in the last 72 hours  Physical Exam:  Neurologically intact ABD soft Neurovascular intact Sensation intact distally Intact pulses distally Dorsiflexion/Plantar flexion intact Incision: dressing C/D/I No cellulitis present Compartment soft  Assessment/Plan:  1 Day Post-Op Procedure(s) (LRB): OPEN REDUCTION INTERNAL FIXATION (ORIF) DISTAL FEMUR FRACTURE (N/A) Advance diet Up with therapy Discharge to SNF in a few days most likely Monday when cleared by medicine consult and therapy Brace on right side nonweightbearing right side. ASA 325 one twice a day x4 weeks Incentive spirometry/SCDs. May change right thigh dressing 2 miplex on Saturday or Sunday. Once again patient will need to SNF to nonweightbearing status  right side and wound care/dressing changes    Eian Vandervelden R 07/13/2013, 8:38 AM

## 2013-07-13 NOTE — Progress Notes (Signed)
Rehab Admissions Coordinator Note:  Patient was screened by Retta Diones for appropriateness for an Inpatient Acute Rehab Consult.  At this time, we are recommending [place]. Patient has [employer] and it is unlikely that we could get approval for an acute inpatient rehab admission.  Call me for questions.  Jodell Cipro M 07/13/2013, 4:29 PM  I can be reached at [phone].

## 2013-07-13 NOTE — Op Note (Signed)
NAMECHRYSTEL, BAREFIELD NO.:  [id number]  MEDICAL RECORD NO.:  [id number]  LOCATION:  6N29C                        FACILITY:  [facility]  PHYSICIAN:  Monico Blitz. Maryori Weide, M.D.DATE OF BIRTH:  11/12/1928  DATE OF PROCEDURE:  07/12/2013 DATE OF DISCHARGE:                              OPERATIVE REPORT   PREOPERATIVE DIAGNOSIS:  Right femoral shaft and supracondylar fracture.  POSTOPERATIVE DIAGNOSIS:  Right femoral shaft and supracondylar fracture.  PROCEDURE:  ORIF right distal femur fracture.  ANESTHESIA:  General.  ATTENDING SURGEON:  Monico Blitz. Rhona Raider, MD  ASSISTANT:  Roselee Nova, P.A.  INDICATION FOR PROCEDURE:  The patient is an 80 year old woman, who is many years from knee and hip replacements on the right.  Unfortunately, she fell at home yesterday and sustained a comminuted displaced fracture from the level of her femoral component of the knee replacement up to the cement mantle of the femoral component of the hip replacement.  She is offered ORIF in hopes of allowing her to get out of bed and potentially to get this to heal so she might stand again.  Informed operative consent was obtained after discussion of possible complications including reaction to anesthesia, infection, DVT, PE, nonunion, and death.  SUMMARY OF FINDINGS AND PROCEDURE:  Under general anesthesia through a lateral approach, we exposed a significantly comminuted distal and supracondylar femur fracture.  We reduced this and then stabilized it with a Zimmer NCB plate.  This was locked distally and proximally and mid range with multiple 5-0 screws and a single 4-0 screw.  We also used a tension band cable incorporated into the plate.  I used fluoroscopy throughout the case to make appropriate intraoperative decisions and read all of these views myself.  Roselee Nova assisted throughout and was invaluable due to the completion of the case, then he helped maintain reduction of  fracture while I placed hardware.  DESCRIPTION OF PROCEDURE:  The patient was taken to the operating suite where general anesthetic was applied without difficulty.  She was positioned supine and prepped and draped in normal sterile fashion. After the administration of preop IV Kefzol, and an appropriate time- out, we performed a closed reduction under fluoroscopy over a triangle. I then made a lateral incision from the lateral aspect of the knee about halfway up the thigh.  Dissection was carried down to IT band, which were incised longitudinally.  The vastus lateralis was then taken in the anterior direction exposing the femur.  We used multiple reduction clamps and eventually got the distal portion well reduced.  I placed the NCB plate by Zimmer distally with nonlocking screws initially.  We then lined this up to the rest of the femur and initially secured it to a segmental portion and then proximally.  I used most of the screws and proximally had 8 cortices and distally at least 8 cortices and probably the same in the segment along with the tension band.  We seemed to get a good reduction under fluoroscopy and under direct visualization.  I checked alignment in 2 planes.  We then applied some of the locking caps to most of the 5-0 screws.  The wound was copiously irrigated followed by reapproximation of  the IT band with #1 Vicryl in running fashion. Subcutaneous tissues were reapproximated with 0 and 2-0 undyed Vicryl followed by skin closure with staples.  We placed Adaptic, dry gauze, and loose Ace wrap.  Estimated blood loss and fluids was obtained from anesthesia records.  We did use some tranexamic acid during the case to minimize our blood loss, which was not extreme.  DISPOSITION:  The patient was extubated in operating room and taken to recovery room in stable condition.  She is to be admitted back to the [facility] with medical consult.  We will use  aspirin for DVT prophylaxis and will try to mobilize her out of bed as soon as possible.     Monico Blitz Rhona Raider, M.D.     PGD/MEDQ  D:  07/12/2013  T:  07/13/2013  Job:  [id number]

## 2013-07-13 NOTE — Evaluation (Addendum)
Physical Therapy Evaluation Patient Details Name: Holly Banks MRN: [id number] DOB: Aug 04, 1928 Today's Date: 07/13/2013 Time: 4627-0350 PT Time Calculation (min): 34 min  PT Assessment / Plan / Recommendation History of Present Illness  Holly Banks a 80 y.o. female with a history of right knee and hip replacements many years back.  She was walking at home and slipped today and had terrible pain above her replaced right knee.  No LOC and denies pain elsewhere.  Pt is s/p ORIF Rt distal femur fx.   Clinical Impression  Pt adm due to fall and is  s/p Rt ORIF of distal femur fx surgery resulting in functional limitations due to the deficits listed below (see PT Problem List). Patient will benefit from skilled PT to increase their independence and safety with mobility to allow discharge to the venue listed below. PTA pt was independent with all ADLs and mobility. Pt very motivated to return home. Daughter and Yolanda Bonine will be available upon return home to provide 24/7 (A). Pt to be a great candidate for CIR to increase independence with mobility and transfers prior to returning home with family. Daughter very interested in being educated on proper techniques to (A) mother for when she returns home. Had lengthy discussion with pt and daughter regarding D/C disposition and plan of care.      PT Assessment  Patient needs continued PT services    Follow Up Recommendations  CIR    Does the patient have the potential to tolerate intense rehabilitation      Barriers to Discharge        Equipment Recommendations  Wheelchair (measurements PT);Wheelchair cushion (measurements PT);3in1 (PT)    Recommendations for Other Services OT consult;Rehab consult   Frequency Min 4X/week    Precautions / Restrictions Precautions Precautions: Fall Precaution Comments: educated pt and daughter on NWB precaution thoroughly Required Braces or Orthoses: Knee Immobilizer - Right Knee Immobilizer - Right:  On except when in CPM;On at all times Restrictions Weight Bearing Restrictions: Yes RLE Weight Bearing: Non weight bearing Other Position/Activity Restrictions: NO ROM in Rt LE at this time    Pertinent Vitals/Pain 5/10; patient repositioned for comfort with Rt LE elevated; pt premedicated      Mobility  Bed Mobility Overal bed mobility: + 2 for safety/equipment;+2 for physical assistance;Needs Assistance Bed Mobility: Supine to Sit;Sit to Supine Supine to sit: HOB elevated;+2 for physical assistance;+2 for safety/equipment;Max assist Sit to supine: Max assist;+2 for safety/equipment;+2 for physical assistance General bed mobility comments: incr time due to pain required; (A) to bring Rt LE onto/off EOB; max cues and (A) to bring trunk up to sitting position; pt (A) with UEs on handrails; cues for deep breathing  Transfers General transfer comment: not assessed today    Exercises General Exercises - Lower Extremity Ankle Circles/Pumps: AROM;Both;10 reps;Supine;Strengthening   PT Diagnosis: Difficulty walking;Generalized weakness;Acute pain  PT Problem List: Decreased strength;Decreased range of motion;Decreased activity tolerance;Decreased balance;Decreased mobility;Decreased knowledge of use of DME;Decreased safety awareness;Decreased knowledge of precautions;Pain PT Treatment Interventions: DME instruction;Gait training;Functional mobility training;Therapeutic activities;Therapeutic exercise;Balance training;Neuromuscular re-education;Patient/family education;Wheelchair mobility training     PT Goals(Current goals can be found in the care plan section) Acute Rehab PT Goals Patient Stated Goal: to get back to being independent  PT Goal Formulation: With patient/family Time For Goal Achievement: 07/27/13 Potential to Achieve Goals: Good  Visit Information  Last PT Received On: 07/13/13 Assistance Needed: +2 History of Present Illness: Holly Banks a 80 y.o. female with a  history of  right knee and hip replacements many years back.  She was walking at home and slipped today and had terrible pain above her replaced right knee.  No LOC and denies pain elsewhere.  Pt is s/p ORIF Rt distal femur fx.        Prior Functioning  Home Living Family/patient expects to be discharged to:: Private residence Living Arrangements: Other relatives;Other (Comment) (grandson) Available Help at Discharge: Family;Available 24 hours/day Type of Home: House Home Layout: One level Home Equipment: [place] - 2 wheels;Shower seat;Cane - single point Additional Comments: daughter present and reports she is arranging to be able to stay with pt 24/7 when D/C from rehab  Prior Function Level of Independence: Independent with assistive device(s) Comments: pt reports she ambulated with cane as needed; family drives her to/from appointments Communication Communication: No difficulties Dominant Hand: Right    Cognition  Cognition Arousal/Alertness: Awake/alert Behavior During Therapy: WFL for tasks assessed/performed Overall Cognitive Status: Within Functional Limits for tasks assessed Memory: Decreased recall of precautions    Extremity/Trunk Assessment Upper Extremity Assessment Upper Extremity Assessment: Defer to OT evaluation Lower Extremity Assessment Lower Extremity Assessment: RLE deficits/detail RLE: Unable to fully assess due to pain;Unable to fully assess due to immobilization Cervical / Trunk Assessment Cervical / Trunk Assessment: Kyphotic   Balance Balance Overall balance assessment: Needs assistance;History of Falls Sitting-balance support: Feet unsupported;Feet supported;Bilateral upper extremity supported;Single extremity supported Sitting balance-[name]: Fair Sitting balance - Comments: progressed to sitting EOB with supervision (A) and single UE support; tolerated sitting EOB ~12 mins; cues to maintain NWB on Rt LE in sitting; able to reach with UEs outside  BOS without LOB  End of Session PT - End of Session Equipment Utilized During Treatment: Right knee immobilizer Activity Tolerance: Patient limited by pain;Patient limited by fatigue Patient left: in bed;with call bell/phone within reach;with family/visitor present Nurse Communication: Mobility status;Precautions  GP     Gustavus Bryant, [place] 07/13/2013, 4:25 PM

## 2013-07-13 NOTE — Progress Notes (Signed)
Ortho PA gave ok to d/c catheter later this afternoon per patient's request.  Will continue to monitor. Syliva Overman

## 2013-07-13 NOTE — Progress Notes (Signed)
Inpatient Diabetes Program Recommendations  AACE/ADA: New Consensus Statement on Inpatient Glycemic Control (2013)  Target Ranges:  Prepandial:   less than 140 mg/dL      Peak postprandial:   less than 180 mg/dL (1-2 hours)      Critically ill patients:  140 - 180 mg/dL   Reason for Visit: Results for ZAURIA, DOMBEK (MRN [id number]) as of 07/13/2013 12:37  Ref. Range 07/12/2013 22:24 07/13/2013 07:44  Glucose-Capillary Latest Range: 70-99 mg/dL 192 (H) 185 (H)  Results for NEZZIE, MANERA (MRN [id number]) as of 07/13/2013 12:37  Ref. Range 06/15/2013 11:28  Hemoglobin A1C Latest Range: 4.6-6.5 % 6.5   Diabetes history: Type 2 diabetes Outpatient Diabetes medications: Amaryl 4 mg bid, Glucophage 500 mg bid Current orders for Inpatient glycemic control: Amaryl 4 mg q HS, Novolog moderate tid with meals and HS  Please consider discontinuation of Amaryl while patient is in the hospital.  Also may consider adding Lantus 10 units daily (while patient is in the hospital).  Thanks, Adah Perl, RN, BC-ADM Inpatient Diabetes Coordinator Pager [phone]

## 2013-07-14 LAB — CBC
HEMATOCRIT: 29.6 % — AB (ref 36.0–46.0)
HEMOGLOBIN: 10.1 g/dL — AB (ref 12.0–15.0)
MCH: 27.7 pg (ref 26.0–34.0)
MCHC: 34.1 g/dL (ref 30.0–36.0)
MCV: 81.3 fL (ref 78.0–100.0)
Platelets: 140 10*3/uL — ABNORMAL LOW (ref 150–400)
RBC: 3.64 MIL/uL — ABNORMAL LOW (ref 3.87–5.11)
RDW: 14.6 % (ref 11.5–15.5)
WBC: 9.4 10*3/uL (ref 4.0–10.5)

## 2013-07-14 LAB — GLUCOSE, CAPILLARY
GLUCOSE-CAPILLARY: 142 mg/dL — AB (ref 70–99)
GLUCOSE-CAPILLARY: 161 mg/dL — AB (ref 70–99)
Glucose-Capillary: 166 mg/dL — ABNORMAL HIGH (ref 70–99)
Glucose-Capillary: 119 mg/dL — ABNORMAL HIGH (ref 70–99)

## 2013-07-14 NOTE — Progress Notes (Signed)
Subjective: 2 Days Post-Op Procedure(s) (LRB): OPEN REDUCTION INTERNAL FIXATION (ORIF) DISTAL FEMUR FRACTURE (N/A) Complains of moderate right thigh pain.  No other complaints.  She was up with physical therapy yesterday.    Objective: Vital signs in last 24 hours: Temp:  [98.7 F (37.1 C)-100.3 F (37.9 C)] 99.3 F (37.4 C) (02/07 0530) Pulse Rate:  [76-89] 89 (02/07 0530) Resp:  [16-20] 16 (02/07 0530) BP: (115-150)/(43-53) 150/53 mmHg (02/07 0530) SpO2:  [92 %-95 %] 94 % (02/07 0530)  Intake/Output from previous day: 02/06 0701 - 02/07 0700 In: 1656.7 [P.O.:480; I.V.:1176.7] Out: 175 [Urine:175] Intake/Output this shift:     Recent Labs  07/11/13 1345 07/12/13 0413 07/13/13 0345 07/14/13 0449  HGB 10.5* 9.3* 10.7* 10.1*    Recent Labs  07/13/13 0345 07/14/13 0449  WBC 10.6* 9.4  RBC 3.84* 3.64*  HCT 31.5* 29.6*  PLT 132* 140*    Recent Labs  07/11/13 1510 07/11/13 1605 07/13/13 0345  NA 137  --  139  K 6.0* 3.9 4.3  CL 100  --  104  CO2 27  --  22  BUN 14  --  17  CREATININE 0.61  --  0.76  GLUCOSE 158*  --  226*  CALCIUM 9.1  --  8.6   Right lower extremity exam: Knee immobilizer in place. Neurovascular intact Sensation intact distally Intact pulses distally Dorsiflexion/Plantar flexion intact Compartment soft  Assessment/Plan: 2 Days Post-Op Procedure(s) (LRB): OPEN REDUCTION INTERNAL FIXATION (ORIF) DISTAL FEMUR FRACTURE (N/A) Plan: Nonweightbearing on right lower extremity. We'll change dressing in a.m. Up with therapy Discharge to SNF possibly Monday.  Shawnese Magner G 07/14/2013, 10:56 AM

## 2013-07-15 LAB — CBC
HEMATOCRIT: 27.9 % — AB (ref 36.0–46.0)
Hemoglobin: 9.5 g/dL — ABNORMAL LOW (ref 12.0–15.0)
MCH: 27.8 pg (ref 26.0–34.0)
MCHC: 34.1 g/dL (ref 30.0–36.0)
MCV: 81.6 fL (ref 78.0–100.0)
PLATELETS: 144 10*3/uL — AB (ref 150–400)
RBC: 3.42 MIL/uL — ABNORMAL LOW (ref 3.87–5.11)
RDW: 14.7 % (ref 11.5–15.5)
WBC: 8.6 10*3/uL (ref 4.0–10.5)

## 2013-07-15 LAB — GLUCOSE, CAPILLARY
GLUCOSE-CAPILLARY: 127 mg/dL — AB (ref 70–99)
GLUCOSE-CAPILLARY: 166 mg/dL — AB (ref 70–99)
Glucose-Capillary: 163 mg/dL — ABNORMAL HIGH (ref 70–99)
Glucose-Capillary: 166 mg/dL — ABNORMAL HIGH (ref 70–99)

## 2013-07-15 NOTE — Evaluation (Signed)
Occupational Therapy Evaluation Patient Details Name: Holly Banks MRN: [id number] DOB: [birth year] Today's Date: 07/15/2013 Time: 6144-3154 OT Time Calculation (min): 41 min  OT Assessment / Plan / Recommendation History of present illness Holly Banks a 80 y.o. female with a history of right knee and hip replacements many years back.  She was walking at home and slipped today and had terrible pain above her replaced right knee.  No LOC and denies pain elsewhere.  Pt is s/p ORIF Rt distal femur fx.    Clinical Impression   Pt admitted with above. She demonstrates the below listed deficits and will benefit from continued OT to maximize safety and independence with BADLs.  Recommend SNF level rehab    OT Assessment  Patient needs continued OT Services    Follow Up Recommendations  SNF    Barriers to Discharge Decreased caregiver support    Equipment Recommendations  None recommended by OT    Recommendations for Other Services    Frequency  Min 2X/week    Precautions / Restrictions Precautions Precautions: Fall Required Braces or Orthoses: Knee Immobilizer - Right Knee Immobilizer - Right: On except when in CPM;On at all times Restrictions Weight Bearing Restrictions: Yes RLE Weight Bearing: Non weight bearing Other Position/Activity Restrictions: NO ROM in Rt LE at this time    Pertinent Vitals/Pain     ADL  Eating/Feeding: Independent Where Assessed - Eating/Feeding: Bed level Grooming: Wash/dry hands;Wash/dry face;Teeth care;Supervision/safety Where Assessed - Grooming: Unsupported sitting Upper Body Bathing: Supervision/safety;Set up Where Assessed - Upper Body Bathing: Supported sitting Lower Body Bathing: Maximal assistance Where Assessed - Lower Body Bathing: Supine, head of bed up;Rolling right and/or left Upper Body Dressing: Supervision/safety Where Assessed - Upper Body Dressing: Unsupported sitting Lower Body Dressing: +1 Total assistance Where  Assessed - Lower Body Dressing: Supine, head of bed up;Rolling right and/or left Toilet Transfer: +1 Total assistance (unable ) Toileting - Clothing Manipulation and Hygiene: +1 Total assistance Where Assessed - Toileting Clothing Manipulation and Hygiene: Rolling right and/or left Equipment Used: Knee Immobilizer    OT Diagnosis: Generalized weakness;Acute pain  OT Problem List: Decreased strength;Decreased activity tolerance;Impaired balance (sitting and/or standing);Decreased knowledge of use of DME or AE;Decreased knowledge of precautions;Obesity;Pain OT Treatment Interventions: Self-care/ADL training;Therapeutic exercise;DME and/or AE instruction;Therapeutic activities;Patient/family education   OT Goals(Current goals can be found in the care plan section) Acute Rehab OT Goals Patient Stated Goal: to get back to being independent  OT Goal Formulation: With patient Time For Goal Achievement: 07/22/13 Potential to Achieve Goals: Good ADL Goals Pt Will Perform Lower Body Bathing: with mod assist;with adaptive equipment;sit to/from stand Pt Will Perform Lower Body Dressing: with mod assist;with adaptive equipment;sit to/from stand Pt Will Transfer to Toilet: with mod assist;stand pivot transfer;bedside commode  Visit Information  Last OT Received On: 07/15/13 Assistance Needed: +2 History of Present Illness: Holly Banks a 80 y.o. female with a history of right knee and hip replacements many years back.  She was walking at home and slipped today and had terrible pain above her replaced right knee.  No LOC and denies pain elsewhere.  Pt is s/p ORIF Rt distal femur fx.        Prior [place] expects to be discharged to:: Skilled nursing facility Prior Function Level of Independence: Independent with assistive device(s) Comments: pt reports she ambulated with cane as needed; family drives her to/from appointments Communication Communication:  No difficulties Dominant Hand: Right  Vision/Perception     Cognition  Cognition Arousal/Alertness: Awake/alert Behavior During Therapy: WFL for tasks assessed/performed Overall Cognitive Status: Within Functional Limits for tasks assessed    Extremity/Trunk Assessment Upper Extremity Assessment Upper Extremity Assessment: RUE deficits/detail RUE Deficits / Details: AROM chronically limted Rt shoulder Lower Extremity Assessment Lower Extremity Assessment: Defer to PT evaluation     Mobility Bed Mobility Overal bed mobility: Needs Assistance Bed Mobility: Supine to Sit;Sit to Supine;Rolling Rolling: Min assist;Mod assist (min a to Lt; mod A to Rt with rails) Supine to sit: Mod assist Sit to supine: Mod assist General bed mobility comments: Pt required assist for Rt. LE and to lift and lower trunk onto bed.  Use of bed pad to move buttocks Transfers General transfer comment: not assessed today     Exercise     Balance Balance Overall balance assessment: Needs assistance Sitting-balance support: Feet supported Sitting balance-Holly Banks: Good Sitting balance - Comments: Pt able to sit EOB x 18 mins with supervision.     End of Session OT - End of Session Equipment Utilized During Treatment: Right knee immobilizer Activity Tolerance: Patient tolerated treatment well Patient left: in bed;with call bell/phone within reach;with bed alarm set Nurse Communication: Mobility status  [place], Holly Banks 07/15/2013, 7:02 PM

## 2013-07-15 NOTE — Progress Notes (Signed)
Subjective: 3 Days Post-Op Procedure(s) (LRB): OPEN REDUCTION INTERNAL FIXATION (ORIF) DISTAL FEMUR FRACTURE (N/A) Patient reports pain as 2 on 0-10 scale.  Taking po/voiding ok. No pain unless she moves her leg about.  Objective: Vital signs in last 24 hours: Temp:  [98.5 F (36.9 C)-99.2 F (37.3 C)] 98.5 F (36.9 C) (02/08 0623) Pulse Rate:  [66-74] 74 (02/08 0623) Resp:  [16-20] 16 (02/08 0800) BP: (108-139)/(55) 139/55 mmHg (02/08 0623) SpO2:  [94 %-98 %] 98 % (02/08 0800)  Intake/Output from previous day: 02/07 0701 - 02/08 0700 In: 930 [P.O.:480; I.V.:450] Out: -  Intake/Output this shift:     Recent Labs  07/13/13 0345 07/14/13 0449 07/15/13 0432  HGB 10.7* 10.1* 9.5*    Recent Labs  07/14/13 0449 07/15/13 0432  WBC 9.4 8.6  RBC 3.64* 3.42*  HCT 29.6* 27.9*  PLT 140* 144*    Recent Labs  07/13/13 0345  NA 139  K 4.3  CL 104  CO2 22  BUN 17  CREATININE 0.76  GLUCOSE 226*  CALCIUM 8.6   Right leg exam: Neurovascular intact Sensation intact distally Intact pulses distally Dorsiflexion/Plantar flexion intact Incision: no drainage No cellulitis present Compartment soft  Assessment/Plan: 3 Days Post-Op Procedure(s) (LRB): OPEN REDUCTION INTERNAL FIXATION (ORIF) DISTAL FEMUR FRACTURE (N/A) Plan: Up with therapy Discharge to [facility] Monday. Cont ASA 325 mg BID for DVT prophylaxis Dressing changed today. NWB on Right leg. Jonay Hitchcock G 07/15/2013, 10:32 AM

## 2013-07-15 NOTE — Progress Notes (Signed)
Clinical Education officer, museum (CSW) gave patient bed offers to skilled nursing facilities. Patient reported that her daughter is going to look into a few on the list.   Blima Rich, Latanya Presser Weekend CSW [phone]

## 2013-07-16 ENCOUNTER — Encounter: Payer: Self-pay | Admitting: Internal Medicine

## 2013-07-16 ENCOUNTER — Other Ambulatory Visit: Payer: Self-pay | Admitting: Internal Medicine

## 2013-07-16 DIAGNOSIS — N390 Urinary tract infection, site not specified: Secondary | ICD-10-CM

## 2013-07-16 DIAGNOSIS — K219 Gastro-esophageal reflux disease without esophagitis: Secondary | ICD-10-CM

## 2013-07-16 DIAGNOSIS — S7291XA Unspecified fracture of right femur, initial encounter for closed fracture: Secondary | ICD-10-CM

## 2013-07-16 LAB — GLUCOSE, CAPILLARY
Glucose-Capillary: 158 mg/dL — ABNORMAL HIGH (ref 70–99)
Glucose-Capillary: 189 mg/dL — ABNORMAL HIGH (ref 70–99)
Glucose-Capillary: 148 mg/dL — ABNORMAL HIGH (ref 70–99)
Glucose-Capillary: 126 mg/dL — ABNORMAL HIGH (ref 70–99)

## 2013-07-16 MED ORDER — CIPROFLOXACIN HCL 500 MG PO TABS: 500.0000 mg | ORAL_TABLET | Freq: Two times a day (BID) | ORAL | Status: DC

## 2013-07-16 MED ORDER — CHLORHEXIDINE GLUCONATE CLOTH 2 % EX PADS: 6.0000 | MEDICATED_PAD | Freq: Every day | CUTANEOUS | Status: DC

## 2013-07-16 MED ORDER — MUPIROCIN 2 % EX OINT: 1.0000 "application " | TOPICAL_OINTMENT | Freq: Two times a day (BID) | CUTANEOUS | Status: DC

## 2013-07-16 MED ORDER — FERROUS SULFATE 325 (65 FE) MG PO TABS: 325.0000 mg | ORAL_TABLET | Freq: Every day | ORAL | Status: DC

## 2013-07-16 MED ORDER — ASPIRIN 325 MG PO TBEC: 325.0000 mg | DELAYED_RELEASE_TABLET | Freq: Two times a day (BID) | ORAL | Status: DC

## 2013-07-16 MED ORDER — HYDROCODONE-ACETAMINOPHEN 7.5-325 MG PO TABS: 0.5000 | ORAL_TABLET | Freq: Three times a day (TID) | ORAL | Status: DC | PRN

## 2013-07-16 NOTE — Progress Notes (Signed)
Subjective: 4 Days Post-Op Procedure(s) (LRB): OPEN REDUCTION INTERNAL FIXATION (ORIF) DISTAL FEMUR FRACTURE (N/A) Slow progress with therapy she will need nursing facility placement  Activity level:  No weight on right leg. Keep brace on most of the time. Diet tolerance:  ok Voiding:  ok Patient reports pain as 3 on 0-10 scale.    Objective: Vital signs in last 24 hours: Temp:  [98.2 F (36.8 C)-99.9 F (37.7 C)] 99 F (37.2 C) (02/09 0559) Pulse Rate:  [71-77] 71 (02/09 0559) Resp:  [16] 16 (02/09 0559) BP: (135-141)/(51-63) 141/51 mmHg (02/09 0559) SpO2:  [97 %-98 %] 98 % (02/09 0559)  Labs:  Recent Labs  07/14/13 0449 07/15/13 0432  HGB 10.1* 9.5*    Recent Labs  07/14/13 0449 07/15/13 0432  WBC 9.4 8.6  RBC 3.64* 3.42*  HCT 29.6* 27.9*  PLT 140* 144*   No results found for this basename: NA, K, CL, CO2, BUN, CREATININE, GLUCOSE, CALCIUM,  in the last 72 hours No results found for this basename: LABPT, INR,  in the last 72 hours  Physical Exam:  Neurologically intact ABD soft Neurovascular intact Sensation intact distally Intact pulses distally Dorsiflexion/Plantar flexion intact Incision: dressing C/D/I Compartment soft  Assessment/Plan:  4 Days Post-Op Procedure(s) (LRB): OPEN REDUCTION INTERNAL FIXATION (ORIF) DISTAL FEMUR FRACTURE (N/A) Advance diet Up with therapy Discharge to SNF today. Nonweightbearing with brace on right side. No range of motion right knee for the time being. ASA 325 one twice a day x4 weeks. Cipro 500 mg twice a day x4 days. FL-2 signed and on the chart. Prescriptions are in chart.    Kirbi Farrugia R 07/16/2013, 9:31 AM

## 2013-07-16 NOTE — Progress Notes (Signed)
Physical Therapy Treatment Patient Details Name: Holly Banks MRN: [id number] DOB: [birth year] Today's Date: 07/16/2013 Time: 4742-5956 PT Time Calculation (min): 30 min  PT Assessment / Plan / Recommendation  History of Present Illness Holly Banks a 80 y.o. female with a history of right knee and hip replacements many years back.  She was walking at home and slipped today and had terrible pain above her replaced right knee.  No LOC and denies pain elsewhere.  Pt is s/p ORIF Rt distal femur fx.    PT Comments   Pt requires total A to maintain NWB precautions during transfers, difficulty with wt shifts and bearing wt through B UEs.  Pt will benefit from using lift equipment for safety and maintaining NWB precautions.  Follow Up Recommendations  SNF     Does the patient have the potential to tolerate intense rehabilitation     Barriers to Discharge        Equipment Recommendations  Wheelchair (measurements PT);Wheelchair cushion (measurements PT);3in1 (PT)    Recommendations for Other Services    Frequency Min 4X/week   Progress towards PT Goals Progress towards PT goals: Progressing toward goals  Plan Current plan remains appropriate    Precautions / Restrictions Precautions Precautions: Fall Precaution Comments: continued to educate pt on NWB precautions Required Braces or Orthoses: Knee Immobilizer - Right Knee Immobilizer - Right: On at all times Restrictions Weight Bearing Restrictions: Yes RLE Weight Bearing: Non weight bearing   Pertinent Vitals/Pain 2/10 pain R LE after transfer, RN aware    Mobility  Bed Mobility Supine to sit: Mod assist General bed mobility comments: assist for R LE and trunk, use of bed pad to scoot hips forward Transfers Overall transfer level: Needs assistance Equipment used: Rolling walker (2 wheeled) Transfers: Stand Pivot Transfers Stand pivot transfers: Total assist General transfer comment: pt performed SPT with total A to  maintain NWB, total A for wt shift into recliner.  Pt required increased time and max cues for safety and sequencing.  Pt with decreased UE strength limiting abilitly to maintain NWB despite total A to keep R LE off of floor.  Pt requires total A for wt shifts and pivot to chair due to L LE and UE weakness    Exercises     PT Diagnosis:    PT Problem List:   PT Treatment Interventions:     PT Goals (current goals can now be found in the care plan section)    Visit Information  Last PT Received On: 07/16/13 Assistance Needed: +2 History of Present Illness: Holly Banks a 80 y.o. female with a history of right knee and hip replacements many years back.  She was walking at home and slipped today and had terrible pain above her replaced right knee.  No LOC and denies pain elsewhere.  Pt is s/p ORIF Rt distal femur fx.     Subjective Data      Cognition  Cognition Arousal/Alertness: Awake/alert Behavior During Therapy: WFL for tasks assessed/performed Overall Cognitive Status: Within Functional Limits for tasks assessed    Balance     End of Session PT - End of Session Equipment Utilized During Treatment: Gait belt;Right knee immobilizer Activity Tolerance: Patient limited by fatigue Patient left: in chair;with call bell/phone within reach Nurse Communication: Mobility status;Need for lift equipment;Precautions   GP     Holly Banks 07/16/2013, 10:59 AM

## 2013-07-16 NOTE — Discharge Summary (Addendum)
Patient ID: Annalyn Blecher MRN: [id number] DOB/AGE: [birth year] 80 y.o.  Admit date: 07/11/2013 Discharge date: 07/17/2013  Admission Diagnoses:  Active Problems:   Fracture of femur, right, closed   UTI (urinary tract infection)   Discharge Diagnoses:  Same  Past Medical History  Diagnosis Date  . Leiomyoma 2-02    colonic polypoid   . Diabetes mellitus     type II  . GERD (gastroesophageal reflux disease)   . Gout   . Hypertension   . Osteoarthritis   . Urinary incontinence   . Morbid obesity   . Pain in joint, lower leg   . Unspecified vitamin D deficiency   . Hyperkalemia   . Gastritis     w/ hx of h. pylori  . Diverticulosis     Surgeries: Procedure(s): OPEN REDUCTION INTERNAL FIXATION (ORIF) DISTAL FEMUR FRACTURE on 07/11/2013 - 07/12/2013   Consultants: Treatment Team:  Joycelyn Das, MD  Discharged Condition: Improved  Hospital Course: Rafael Quesada is an 80 y.o. female who was admitted 07/11/2013 for operative treatment of<principal problem not specified>. Patient has severe unremitting pain that affects sleep, daily activities, and work/hobbies. After pre-op clearance the patient was taken to the operating room on 07/11/2013 - 07/12/2013 and underwent  Procedure(s): OPEN REDUCTION INTERNAL FIXATION (ORIF) DISTAL FEMUR FRACTURE.    Patient was given perioperative antibiotics: Anti-infectives   Start     Dose/Rate Route Frequency Ordered Stop   07/16/13 0000  ciprofloxacin (CIPRO) 500 MG tablet     500 mg Oral 2 times daily 07/16/13 0935     07/13/13 2000  ciprofloxacin (CIPRO) tablet 500 mg     500 mg Oral 2 times daily 07/13/13 1406     07/13/13 0000  ceFAZolin (ANCEF) IVPB 2 g/50 mL premix     2 g 100 mL/hr over 30 Minutes Intravenous 4 times per day 07/12/13 2045 07/13/13 0600   07/12/13 1630  ciprofloxacin (CIPRO) IVPB 400 mg  Status:  Discontinued     400 mg 200 mL/hr over 60 Minutes Intravenous Every 12 hours 07/12/13 1612 07/13/13 1406   07/12/13  1600  piperacillin-tazobactam (ZOSYN) IVPB 3.375 g  Status:  Discontinued     3.375 g 12.5 mL/hr over 240 Minutes Intravenous Every 8 hours 07/12/13 1559 07/12/13 1610       Patient was given sequential compression devices, early ambulation, and chemoprophylaxis to prevent DVT. Patient will be kept on ASA 325 twice a day x4 weeks from surgery. Continue Cipro 500 twice a day x4 more days. Patient benefited maximally from hospital stay and there were no complications.  tdwb right leg  Recent vital signs: Patient Vitals for the past 24 hrs:  BP Temp Temp src Pulse Resp SpO2  07/16/13 0559 141/51 mmHg 99 F (37.2 C) Oral 71 16 98 %  07/15/13 2123 141/63 mmHg 99.9 F (37.7 C) Oral 77 16 98 %  07/15/13 1600 - - - - 16 97 %  07/15/13 1440 135/55 mmHg 98.2 F (36.8 C) Oral 75 16 97 %  07/15/13 1200 - - - - 16 98 %     Recent laboratory studies:  Recent Labs  07/14/13 0449 07/15/13 0432  WBC 9.4 8.6  HGB 10.1* 9.5*  HCT 29.6* 27.9*  PLT 140* 144*     Discharge Medications:     Medication List    STOP taking these medications       aspirin 81 MG chewable tablet  Replaced by:  aspirin 325 MG EC tablet  ibuprofen 200 MG tablet  Commonly known as:  ADVIL,MOTRIN      TAKE these medications       allopurinol 300 MG tablet  Commonly known as:  ZYLOPRIM  Take 1 tablet (300 mg total) by mouth daily.     aspirin 325 MG EC tablet  Take 1 tablet (325 mg total) by mouth 2 (two) times daily.     Chlorhexidine Gluconate Cloth 2 % Pads  Apply 6 each topically daily at 6 (six) AM.     ciprofloxacin 500 MG tablet  Commonly known as:  CIPRO  Take 1 tablet (500 mg total) by mouth 2 (two) times daily.     darifenacin 7.5 MG 24 hr tablet  Commonly known as:  ENABLEX  Take 1 tablet (7.5 mg total) by mouth daily.     EQL VITAMIN D3 1000 UNITS tablet  Generic drug:  Cholecalciferol  Take 1,000 Units by mouth daily.     ferrous sulfate 325 (65 FE) MG tablet  Take 1 tablet (325  mg total) by mouth daily with breakfast.     glimepiride 4 MG tablet  Commonly known as:  AMARYL  Take 1 tablet (4 mg total) by mouth 2 (two) times daily.     HYDROcodone-acetaminophen 7.5-325 MG per tablet  Commonly known as:  NORCO  Take 0.5-1 tablets by mouth every 8 (eight) hours as needed. Please fill on or after 05/15/13     hydrOXYzine 10 MG tablet  Commonly known as:  ATARAX/VISTARIL  Take 10 mg by mouth 3 (three) times daily as needed.     loratadine 10 MG tablet  Commonly known as:  CLARITIN  Take 10 mg by mouth daily.     losartan 50 MG tablet  Commonly known as:  COZAAR  Take 1 tablet (50 mg total) by mouth daily.     metFORMIN 500 MG tablet  Commonly known as:  GLUCOPHAGE  Take 1 tablet (500 mg total) by mouth 2 (two) times daily with a meal.     mupirocin ointment 2 %  Commonly known as:  BACTROBAN  Place 1 application into the nose 2 (two) times daily.     potassium chloride 10 MEQ tablet  Commonly known as:  KLOR-CON 10  Take 1 tablet (10 mEq total) by mouth daily.     verapamil 360 MG 24 hr capsule  Commonly known as:  VERELAN PM  Take 1 capsule (360 mg total) by mouth at bedtime.     vitamin B-12 1000 MCG tablet  Commonly known as:  CYANOCOBALAMIN  Take 1,000 mcg by mouth daily.       Therapy may work with patient but be tdwb right lower extremity with brace on. For the time being minimal or no motion right knee. ASA 325 twice a day x4 weeks. Continue Cipro 500 mg twice a day x4 more days. May change right leg dressing as needed. Diagnostic Studies: Dg Hip Complete Right  07/11/2013   CLINICAL DATA:  Trauma, fall  EXAM: RIGHT HIP - COMPLETE 2+ VIEW  COMPARISON:  Right knee radiographs 07/11/2013  FINDINGS: Bilateral prostheses.  Bones demineralized.  Large calcified uterine leiomyoma in pelvis, 10.2 x 6.1 cm.  Extensive atherosclerotic calcification in numerous pelvic phleboliths.  Comminuted oblique distal right femoral meta diaphyseal fracture  identified at the margin of the exam, more occluding delineated and reported on knee radiographs.  Proximal right femur appears intact.  IMPRESSION: Comminuted displaced distal right femoral metaphyseal fracture better visualized and  reported on right knee radiograph exam.  Bilateral hip prostheses.  Diffuse osseous demineralization.  No acute proximal right femoral abnormality.   Electronically Signed   By: Lavonia Dana M.D.   On: 07/11/2013 13:10   Dg Femur Right  07/13/2013   CLINICAL DATA:  80 year old female -ORIF right femur fracture.  EXAM: RIGHT FEMUR - 2 VIEW  COMPARISON:  07/12/2013 and 07/11/2013 radiographs  FINDINGS: Internal plate and screw fixation is identified traversing a distal femoral fracture, in near-anatomic alignment and position.  No complicating features are identified.  Hip and knee arthroplasty changes are again noted.  IMPRESSION: Internal fixation of distal femoral fracture, in near-anatomic alignment and position. No complicating features identified.   Electronically Signed   By: Hassan Rowan M.D.   On: 07/13/2013 18:51   Dg Femur Right  07/12/2013   CLINICAL DATA:  Femoral fracture.  EXAM: RIGHT FEMUR - 2 VIEW  COMPARISON:  DG FEMUR*R* dated 07/11/2013; DG KNEE COMPLETE 4 VIEWS*R* dated 07/11/2013  FINDINGS: A comminuted, posteriorly displaced, posteriorly angulated distal femoral shaft fracture is appreciated. The fracture lines extend near the femoral knee prostheses with no definite extension is not clearly appreciated. There is a component of local rod within the fracture. The bones are osteopenic. A hip prostheses is identified which appears to be intact. Atherosclerotic calcifications identified within the femoral and popliteal vessels.  IMPRESSION: Distal femoral shaft fracture.   Electronically Signed   By: Margaree Mackintosh M.D.   On: 07/12/2013 21:12   Dg Femur Right  07/11/2013   CLINICAL DATA:  Pain post trauma  EXAM: RIGHT FEMUR - 2 VIEW  COMPARISON:  Right knee July 11, 2013  FINDINGS: Frontal, oblique, and lateral images were obtained. There is a comminuted fracture of the distal femoral diaphysis with posterior and medial displacement of the more posteromedial fracture fragment. The fracture does not extend into the space occupied by the femoral component of a total knee replacement. The distal aspect of the fracture is near the junction with the prosthesis, however. There is also a total hip prosthesis.  No dislocations. No other fractures. There is extensive arterial vascular calcification.  IMPRESSION: Comminuted fracture distal femur with fracture fragment extending toward but not abutting the total knee prosthesis. Posteriorly, the distal fracture fragment does extend into the distal metaphysis of the femur. No dislocation.   Electronically Signed   By: Lowella Grip M.D.   On: 07/11/2013 14:42   Dg Chest Port 1 View  07/11/2013   CLINICAL DATA:  Leg fracture, hypertension, diabetes  EXAM: PORTABLE CHEST - 1 VIEW  COMPARISON:  Portable exam 1339 hr compared to 03/16/2011  FINDINGS: Enlargement of cardiac silhouette.  Tortuous aorta.  Prominent right superior mediastinal soft tissues unchanged.  Pulmonary vascularity normal.  Lungs clear.  No pleural effusion or pneumothorax.  Scattered endplate spur formation thoracic spine.  Bilateral glenohumeral degenerative changes and probable chronic rotator cuff tears.  IMPRESSION: Enlargement of cardiac silhouette.  No acute abnormalities.   Electronically Signed   By: Lavonia Dana M.D.   On: 07/11/2013 13:49   Dg Knee Complete 4 Views Right  07/11/2013   CLINICAL DATA:  Trauma, fall  EXAM: RIGHT KNEE - COMPLETE 4+ VIEW  COMPARISON:  None  FINDINGS: Components of a right knee prosthesis are identified in expected positions.  Bones appear diffusely demineralized.  Oblique comminuted fracture distal right femoral metadiaphysis, displaced posteriorly and slightly laterally with overriding.  Fracture planes extend to near but not  definitely  to the femoral component of the right knee prosthesis.  No additional fracture or dislocation.  Extensive atherosclerotic calcification.  IMPRESSION: Comminuted oblique displaced distal right femoral metadiaphyseal fracture.  Osseous demineralization with note of components of a right knee prosthesis.   Electronically Signed   By: Lavonia Dana M.D.   On: 07/11/2013 13:08   Dg C-arm Gt 120 Min  07/12/2013   CLINICAL DATA:  Intra op distal femur evaluation  EXAM: DG C-ARM GT 120 MIN  FLUOROSCOPY TIME:  2 min 1 second  FINDINGS: Patient is status post open reduction internal fixation distal femoral shaft fracture. Total knee prostheses is appreciated. Hardware appears intact.  IMPRESSION: The patient is status post open reduction internal fixation distal femoral shaft fracture.   Electronically Signed   By: Margaree Mackintosh M.D.   On: 07/12/2013 21:14    Disposition: 06-Home-Health Care Svc discharge to skilled nursing facility. Return to Dr. Rhona Raider office 2 weeks from surgery.      Discharge Orders   Future Appointments Provider Department Dept Phone   09/13/2013 11:00 AM Cassandria Anger, MD [place] [phone]   Future Orders Complete By Expires   Call MD / Call 911  As directed    Comments:     If you experience chest pain or shortness of breath, CALL 911 and be transported to the hospital emergency room.  If you develope a fever above 101 F, pus (white drainage) or increased drainage or redness at the wound, or calf pain, call your surgeon's office.   Constipation Prevention  As directed    Comments:     Drink plenty of fluids.  Prune juice may be helpful.  You may use a stool softener, such as Colace (over the counter) 100 mg twice a day.  Use MiraLax (over the counter) for constipation as needed.   Diet - low sodium heart healthy  As directed    Increase activity slowly as tolerated  As directed       Follow-up Information   Follow up with  DALLDORF,PETER G, MD. Call in 2 weeks.   Specialty:  Orthopedic Surgery   Contact information:   [place] [address] [phone]        Signed: Dione Housekeeper 07/16/2013, 9:36 AM

## 2013-07-17 LAB — GLUCOSE, CAPILLARY
Glucose-Capillary: 138 mg/dL — ABNORMAL HIGH (ref 70–99)
Glucose-Capillary: 172 mg/dL — ABNORMAL HIGH (ref 70–99)

## 2013-07-17 NOTE — Progress Notes (Signed)
Clinical social worker assisted with patient discharge to skilled nursing facility, [facility].  CSW addressed all family questions and concerns. CSW copied chart and added all important documents. CSW also set up patient transportation with [facility]. Clinical Social Worker will sign off for now as social work intervention is no longer needed.   Lattie Cervi, MSW, LCSWA [phone] 

## 2013-07-17 NOTE — Progress Notes (Signed)
Patient's last recorded BM was on 2/4. The RN attempted to give a Bisacodyl suppository today; however, the patient refused. She stated she felt fine and was passing gas. Will encourage patient to re-evaluate situation.

## 2013-07-17 NOTE — Progress Notes (Addendum)
Subjective: 5 Days Post-Op Procedure(s) (LRB): OPEN REDUCTION INTERNAL FIXATION (ORIF) DISTAL FEMUR FRACTURE (N/A) Okay to go to skilled nursing facility when bed available Activity level: tdwb right side with brace on all times Diet tolerance:  ok Voiding:  ok Patient reports pain as 2 on 0-10 scale.    Objective: Vital signs in last 24 hours: Temp:  [98.6 F (37 C)-99.1 F (37.3 C)] 98.6 F (37 C) (02/10 0617) Pulse Rate:  [70-76] 72 (02/10 0617) Resp:  [16-18] 16 (02/10 0740) BP: (132-138)/(62-65) 132/65 mmHg (02/10 0617) SpO2:  [98 %-99 %] 99 % (02/10 0617)  Labs:  Recent Labs  07/15/13 0432  HGB 9.5*    Recent Labs  07/15/13 0432  WBC 8.6  RBC 3.42*  HCT 27.9*  PLT 144*   No results found for this basename: NA, K, CL, CO2, BUN, CREATININE, GLUCOSE, CALCIUM,  in the last 72 hours No results found for this basename: LABPT, INR,  in the last 72 hours  Physical Exam:  Neurologically intact ABD soft Neurovascular intact Sensation intact distally Intact pulses distally Dorsiflexion/Plantar flexion intact Incision: dressing C/D/I No cellulitis present Compartment soft  Assessment/Plan:  5 Days Post-Op Procedure(s) (LRB): OPEN REDUCTION INTERNAL FIXATION (ORIF) DISTAL FEMUR FRACTURE (N/A) Advance diet Discharge to SNF today if bed available. ASA 325 one twice a day x4 weeks. tdwb right with brace on and assistance Incentive spirometry/SCDs FL-2 and prescriptions are on chart. Return to office 2 weeks.    Scotlyn Mccranie R 07/17/2013, 11:14 AM

## 2013-07-17 NOTE — Progress Notes (Signed)
Physical Therapy Treatment Patient Details Name: Holly Banks MRN: [id number] DOB: Oct 30, 1928 Today's Date: 07/17/2013 Time: 1344-1410 PT Time Calculation (min): 26 min  PT Assessment / Plan / Recommendation  History of Present Illness Holly Banks a 80 y.o. female with a history of right knee and hip replacements many years back.  She was walking at home and slipped today and had terrible pain above her replaced right knee.  No LOC and denies pain elsewhere.  Pt is s/p ORIF Rt distal femur fx.    PT Comments   Patient agreeable to OOB however has difficult time maintaining NWB. Rn aware of transfer and possible use of lift pad back to bed. Continue to recommend SNF for ongoing Physical Therapy.     Follow Up Recommendations  SNF     Does the patient have the potential to tolerate intense rehabilitation     Barriers to Discharge        Equipment Recommendations  Wheelchair (measurements PT);Wheelchair cushion (measurements PT);3in1 (PT)    Recommendations for Other Services    Frequency Min 3X/week   Progress towards PT Goals Progress towards PT goals: Progressing toward goals  Plan Current plan remains appropriate    Precautions / Restrictions Precautions Precautions: Fall Precaution Comments: continued to educate pt on NWB precautions Required Braces or Orthoses: Knee Immobilizer - Right Knee Immobilizer - Right: On at all times Restrictions RLE Weight Bearing: Non weight bearing Other Position/Activity Restrictions: NO ROM in Rt LE at this time    Pertinent Vitals/Pain no apparent distress    Mobility  Bed Mobility Supine to sit: Mod assist General bed mobility comments: assist for R LE and trunk, use of bed pad to scoot hips forward Transfers Overall transfer level: Needs assistance Equipment used: Rolling walker (2 wheeled) Stand pivot transfers: +2 physical assistance;Max assist General transfer comment: pt performed SPT with total A to maintain NWB,  total A for wt shift onto BSC than BSC moved and recliner placed behind patient as she could not tolerate another SPT and maintain WBing restrictions. Pt required increased time and max cues for safety and sequencing.  Pt with decreased UE strength limiting abilitly to maintain NWB despite total A to keep R LE off of floor.      Exercises     PT Diagnosis:    PT Problem List:   PT Treatment Interventions:     PT Goals (current goals can now be found in the care plan section)    Visit Information  Last PT Received On: 07/17/13 Assistance Needed: +2 History of Present Illness: Holly Banks a 80 y.o. female with a history of right knee and hip replacements many years back.  She was walking at home and slipped today and had terrible pain above her replaced right knee.  No LOC and denies pain elsewhere.  Pt is s/p ORIF Rt distal femur fx.     Subjective Data      Cognition  Cognition Arousal/Alertness: Awake/alert Behavior During Therapy: WFL for tasks assessed/performed Overall Cognitive Status: Within Functional Limits for tasks assessed Memory: Decreased recall of precautions    Balance     End of Session PT - End of Session Equipment Utilized During Treatment: Gait belt;Right knee immobilizer Activity Tolerance: Patient limited by fatigue Patient left: in chair;with call bell/phone within reach Nurse Communication: Mobility status;Need for lift equipment;Precautions   GP     Robinette, Tonia Brooms 07/17/2013, 2:53 PM  07/17/2013 Holly Banks PTA [phone] pager [phone] office

## 2013-07-18 LAB — GLUCOSE, CAPILLARY: Glucose-Capillary: 205 mg/dL — ABNORMAL HIGH (ref 70–99)

## 2013-07-19 ENCOUNTER — Non-Acute Institutional Stay (SKILLED_NURSING_FACILITY): Payer: Medicare PPO | Admitting: Adult Health

## 2013-07-19 DIAGNOSIS — I10 Essential (primary) hypertension: Secondary | ICD-10-CM

## 2013-07-19 DIAGNOSIS — D62 Acute posthemorrhagic anemia: Secondary | ICD-10-CM | POA: Insufficient documentation

## 2013-07-19 DIAGNOSIS — E119 Type 2 diabetes mellitus without complications: Secondary | ICD-10-CM

## 2013-07-19 DIAGNOSIS — S7291XA Unspecified fracture of right femur, initial encounter for closed fracture: Secondary | ICD-10-CM

## 2013-07-19 DIAGNOSIS — M109 Gout, unspecified: Secondary | ICD-10-CM

## 2013-07-19 DIAGNOSIS — S7290XA Unspecified fracture of unspecified femur, initial encounter for closed fracture: Secondary | ICD-10-CM

## 2013-07-19 DIAGNOSIS — N390 Urinary tract infection, site not specified: Secondary | ICD-10-CM

## 2013-07-19 NOTE — Progress Notes (Signed)
Patient ID: Holly Banks, female   DOB: 1928/09/16, 80 y.o.   MRN: [id number]               PROGRESS NOTE  DATE: 07/19/2013  FACILITY: Nursing Home Location: [facility]  LEVEL OF CARE: SNF (31)  Acute Visit  CHIEF COMPLAINT: Follow-up Hospitalization  HISTORY OF PRESENT ILLNESS: This is an 80 year old female who has been admitted to [facility] on 07/17/13 from [facility] with primary diagnoses of Fracture of right femur S/P ORIF and UTI. She has been admitted for a short-term rehabilitation.   REASSESSMENT OF ONGOING PROBLEM(S):  HTN: Pt 's HTN remains stable.  Denies CP, sob, DOE, pedal edema, headaches, dizziness or visual disturbances.  No complications from the medications currently being used.  Last BP :133/58  DM:pt's DM remains stable.  Pt denies polyuria, polydipsia, polyphagia, changes in vision or hypoglycemic episodes.  No complications noted from the medication presently being used.  1/15  hemoglobin A1c is: 6.5  UTI: The UTI remains stable.  The patient denies ongoing suprapubic pain, flank pain, dysuria, urinary frequency, urinary hesitancy or hematuria.  No complications reported from the current antibiotic being used.   PAST MEDICAL HISTORY : Reviewed.  No changes.  CURRENT MEDICATIONS: Reviewed per [facility]  REVIEW OF SYSTEMS:  GENERAL: no change in appetite, no fatigue, no weight changes, no fever, chills or weakness RESPIRATORY: no cough, SOB, DOE, wheezing, hemoptysis CARDIAC: no chest pain, edema or palpitations GI: no abdominal pain, diarrhea, constipation, heart burn, nausea or vomiting  PHYSICAL EXAMINATION  VS:  T99.7       P83      RR20      BP133/58     POX96 %       GENERAL: no acute distress, normal body habitus EYES: conjunctivae normal, sclerae normal, normal eye lids NECK: supple, trachea midline, no neck masses, no thyroid tenderness, no thyromegaly LYMPHATICS: no LAN in the neck, no supraclavicular  LAN RESPIRATORY: breathing is even & unlabored, BS CTAB CARDIAC: RRR, no murmur,no extra heart sounds, no edema GI: abdomen soft, normal BS, no masses, no tenderness, no hepatomegaly, no splenomegaly PSYCHIATRIC: the patient is alert & oriented to person, affect & behavior appropriate  LABS/RADIOLOGY: Labs reviewed: Basic Metabolic Panel:  Recent Labs  06/15/13 1128 07/11/13 1510 07/11/13 1605 07/13/13 0345  NA 140 137  --  139  K 3.9 6.0* 3.9 4.3  CL 107 100  --  104  CO2 26 27  --  22  GLUCOSE 157* 158*  --  226*  BUN 14 14  --  17  CREATININE 0.7 0.61  --  0.76  CALCIUM 9.5 9.1  --  8.6   CBC:  Recent Labs  07/11/13 1345  07/13/13 0345 07/14/13 0449 07/15/13 0432  WBC 6.0  < > 10.6* 9.4 8.6  NEUTROABS 4.4  --   --   --   --   HGB 10.5*  < > 10.7* 10.1* 9.5*  HCT 32.3*  < > 31.5* 29.6* 27.9*  MCV 80.8  < > 82.0 81.3 81.6  PLT 256  < > 132* 140* 144*  < > = values in this interval not displayed.  CBG:  Recent Labs  07/17/13 0633 07/17/13 1207 07/17/13 1737  GLUCAP 138* 172* 205*     ASSESSMENT/PLAN:  Fracture of right femur S/P ORIF - for rehabilitation  UTI - continue Cipro  Diabetes Mellitus, type 2 - well-controlled; continue Metformin and Amaryl  Gout - stable  Anemia, acute blood loss - stable  Hypertension - well-controlled    CPT CODE: 07680

## 2013-07-27 ENCOUNTER — Encounter (HOSPITAL_COMMUNITY): Payer: Self-pay | Admitting: Orthopaedic Surgery

## 2013-08-02 ENCOUNTER — Encounter: Payer: Self-pay | Admitting: *Deleted

## 2013-09-13 ENCOUNTER — Ambulatory Visit: Payer: Medicare PPO | Admitting: Internal Medicine

## 2013-09-18 ENCOUNTER — Non-Acute Institutional Stay (SKILLED_NURSING_FACILITY): Payer: Medicare PPO | Admitting: Adult Health

## 2013-09-18 DIAGNOSIS — M109 Gout, unspecified: Secondary | ICD-10-CM

## 2013-09-18 DIAGNOSIS — I10 Essential (primary) hypertension: Secondary | ICD-10-CM

## 2013-09-18 DIAGNOSIS — E119 Type 2 diabetes mellitus without complications: Secondary | ICD-10-CM

## 2013-09-18 DIAGNOSIS — S7291XA Unspecified fracture of right femur, initial encounter for closed fracture: Secondary | ICD-10-CM

## 2013-09-18 DIAGNOSIS — S7290XA Unspecified fracture of unspecified femur, initial encounter for closed fracture: Secondary | ICD-10-CM

## 2013-09-18 DIAGNOSIS — D62 Acute posthemorrhagic anemia: Secondary | ICD-10-CM

## 2013-09-19 ENCOUNTER — Encounter: Payer: Self-pay | Admitting: Adult Health

## 2013-09-19 NOTE — Progress Notes (Signed)
Patient ID: Holly Banks, female   DOB: August 15, 1928, 80 y.o.   MRN: [id number]         PROGRESS NOTE  DATE:  09/18/13  FACILITY: Nursing Home Location: [facility]  LEVEL OF CARE: SNF (31)  Acute Visit  CHIEF COMPLAINT: Discharge Notes  HISTORY OF PRESENT ILLNESS: This is an 80 year old female who is for discharge home with Home health PT, OT, Nursing and Home health Aide. She has been admitted to [facility] on 07/17/13 from [facility] with primary diagnoses of Fracture of right femur S/P ORIF and UTI. Patient was admitted to this facility for short-term rehabilitation after the patient's recent hospitalization.  Patient has completed SNF rehabilitation and therapy has cleared the patient for discharge.   REASSESSMENT OF ONGOING PROBLEM(S):  HTN: Pt 's HTN remains stable.  Denies CP, sob, DOE, pedal edema, headaches, dizziness or visual disturbances.  No complications from the medications currently being used.  Last BP :124/68  GOUT: Patien'st  gout remains stable. Patient denies joint pan, redness, swelling or warmth. No complications reported from the medications presently being used.  ANEMIA: The anemia has been stable. The patient denies fatigue, melena or hematochezia. No complications from the medications currently being used. 2/15 hgb 9.9   PAST MEDICAL HISTORY : Reviewed.  No changes.  CURRENT MEDICATIONS: Reviewed per [facility]  REVIEW OF SYSTEMS:  GENERAL: no change in appetite, no fatigue, no weight changes, no fever, chills or weakness RESPIRATORY: no cough, SOB, DOE, wheezing, hemoptysis CARDIAC: no chest pain, edema or palpitations GI: no abdominal pain, diarrhea, constipation, heart burn, nausea or vomiting  PHYSICAL EXAMINATION  GENERAL: no acute distress, normal body habitus NECK: supple, trachea midline, no neck masses, no thyroid tenderness, no thyromegaly LYMPHATICS: no LAN in the neck, no supraclavicular LAN RESPIRATORY: breathing  is even & unlabored, BS CTAB CARDIAC: RRR, no murmur,no extra heart sounds, no edema GI: abdomen soft, normal BS, no masses, no tenderness, no hepatomegaly, no splenomegaly EXTREMITIES: able to move all 4 extremities PSYCHIATRIC: the patient is alert & oriented to person, affect & behavior appropriate  LABS/RADIOLOGY: 07/19/13 WBC 7.1 hemoglobin 9.9 hematocrit 30.5 Labs reviewed: Basic Metabolic Panel:  Recent Labs  06/15/13 1128 07/11/13 1510 07/11/13 1605 07/13/13 0345  NA 140 137  --  139  K 3.9 6.0* 3.9 4.3  CL 107 100  --  104  CO2 26 27  --  22  GLUCOSE 157* 158*  --  226*  BUN 14 14  --  17  CREATININE 0.7 0.61  --  0.76  CALCIUM 9.5 9.1  --  8.6   CBC:  Recent Labs  07/11/13 1345  07/13/13 0345 07/14/13 0449 07/15/13 0432  WBC 6.0  < > 10.6* 9.4 8.6  NEUTROABS 4.4  --   --   --   --   HGB 10.5*  < > 10.7* 10.1* 9.5*  HCT 32.3*  < > 31.5* 29.6* 27.9*  MCV 80.8  < > 82.0 81.3 81.6  PLT 256  < > 132* 140* 144*  < > = values in this interval not displayed.  CBG:  Recent Labs  07/17/13 0633 07/17/13 1207 07/17/13 1737  GLUCAP 138* 172* 205*     ASSESSMENT/PLAN:  Fracture of right femur S/P ORIF - for home health PT, OT, nursing and home health aide  Diabetes Mellitus, type 2 - well-controlled; continue Metformin and Amaryl  Gout - stable  Anemia, acute blood loss - stable  Hypertension -  well-controlled   I have filled out patient's discharge paperwork and written prescriptions.  Patient will receive home health PT, OT, Nursing and Home health Aide.  Total discharge time: Less than 30 minutes Discharge time involved coordination of the discharge process with Education officer, museum, nursing staff and therapy department. Medical justification for home health services verified.    CPT CODE: 29937  Seth Bake - NP [facility] [phone]

## 2013-09-25 DIAGNOSIS — E119 Type 2 diabetes mellitus without complications: Secondary | ICD-10-CM

## 2013-09-25 DIAGNOSIS — M159 Polyosteoarthritis, unspecified: Secondary | ICD-10-CM

## 2013-09-25 DIAGNOSIS — S7290XD Unspecified fracture of unspecified femur, subsequent encounter for closed fracture with routine healing: Secondary | ICD-10-CM

## 2013-09-25 DIAGNOSIS — I10 Essential (primary) hypertension: Secondary | ICD-10-CM

## 2013-09-26 ENCOUNTER — Ambulatory Visit (INDEPENDENT_AMBULATORY_CARE_PROVIDER_SITE_OTHER): Payer: Medicare PPO | Admitting: Internal Medicine

## 2013-09-26 ENCOUNTER — Encounter: Payer: Self-pay | Admitting: Internal Medicine

## 2013-09-26 VITALS — BP 150/70 | HR 64 | Temp 98.2°F | Wt 230.5 lb

## 2013-09-26 DIAGNOSIS — S7291XA Unspecified fracture of right femur, initial encounter for closed fracture: Secondary | ICD-10-CM

## 2013-09-26 DIAGNOSIS — E559 Vitamin D deficiency, unspecified: Secondary | ICD-10-CM

## 2013-09-26 DIAGNOSIS — E119 Type 2 diabetes mellitus without complications: Secondary | ICD-10-CM

## 2013-09-26 DIAGNOSIS — S7290XA Unspecified fracture of unspecified femur, initial encounter for closed fracture: Secondary | ICD-10-CM

## 2013-09-26 DIAGNOSIS — E538 Deficiency of other specified B group vitamins: Secondary | ICD-10-CM

## 2013-09-26 DIAGNOSIS — R32 Unspecified urinary incontinence: Secondary | ICD-10-CM

## 2013-09-26 NOTE — Assessment & Plan Note (Signed)
Continue with current prescription therapy as reflected on the Med list.  

## 2013-09-26 NOTE — Assessment & Plan Note (Signed)
D/c Ditropan  Due to dry mouth

## 2013-09-26 NOTE — Progress Notes (Signed)
Patient ID: Holly Banks, female   DOB: 18-Dec-1928, 80 y.o.   MRN: [id number]   Subjective:     HPI  Fracture of femur, right, closed - 2/15; back home a week ago  The patient presents for a follow-up of  chronic hypertension, chronic dyslipidemia, type 2 diabetes controlled with medicines. C/o cough x 3-4 wks  F/u L>R knee pain off and on - stable  Wt Readings from Last 3 Encounters:  09/26/13 230 lb 8 oz (104.554 kg)  09/18/13 229 lb 12.8 oz (104.237 kg)  07/11/13 239 lb (108.41 kg)   BP Readings from Last 3 Encounters:  09/26/13 150/70  09/18/13 124/68  07/19/13 133/58       Review of Systems  Constitutional: Negative for activity change, appetite change, fatigue and unexpected weight change.  HENT: Negative for congestion, mouth sores and sinus pressure.   Eyes: Negative for visual disturbance.  Respiratory: Negative for chest tightness.   Gastrointestinal: Negative for nausea and abdominal pain.  Genitourinary: Negative for frequency, difficulty urinating and vaginal pain.  Musculoskeletal: Positive for arthralgias and gait problem. Negative for back pain.  Skin: Negative for pallor.  Neurological: Negative for dizziness, tremors and weakness.  Psychiatric/Behavioral: Negative for confusion and sleep disturbance. The patient is not nervous/anxious.        Objective:   Physical Exam  Constitutional: She appears well-developed. No distress.  obese  HENT:  Head: Normocephalic.  Right Ear: External ear normal.  Left Ear: External ear normal.  Nose: Nose normal.  Mouth/Throat: Oropharynx is clear and moist.  Eyes: Conjunctivae are normal. Pupils are equal, round, and reactive to light. Right eye exhibits no discharge. Left eye exhibits no discharge.  Neck: Normal range of motion. Neck supple. No JVD present. No tracheal deviation present. No thyromegaly present.  Cardiovascular: Normal rate, regular rhythm and normal heart sounds.   Pulmonary/Chest: No  stridor. No respiratory distress. She has no wheezes.  Abdominal: Soft. Bowel sounds are normal. She exhibits no distension and no mass. There is no tenderness. There is no rebound and no guarding.  Musculoskeletal: She exhibits tenderness. She exhibits no edema.  Using  A cane  Lymphadenopathy:    She has no cervical adenopathy.  Neurological: She displays normal reflexes. No cranial nerve deficit. She exhibits normal muscle tone. Coordination abnormal.  Skin: No rash noted. No erythema.  Psychiatric: She has a normal mood and affect. Her behavior is normal. Judgment and thought content normal.   W/c and a walker   Lab Results  Component Value Date   WBC 8.6 07/15/2013   HGB 9.5* 07/15/2013   HCT 27.9* 07/15/2013   PLT 144* 07/15/2013   GLUCOSE 226* 07/13/2013   CHOL 198 05/04/2011   TRIG 116.0 05/04/2011   HDL 62.30 05/04/2011   LDLDIRECT 126.1 03/31/2010   LDLCALC 113* 05/04/2011   ALT 15 05/04/2011   AST 18 05/04/2011   NA 139 07/13/2013   K 4.3 07/13/2013   CL 104 07/13/2013   CREATININE 0.76 07/13/2013   BUN 17 07/13/2013   CO2 22 07/13/2013   TSH 2.92 08/01/2012   INR 2.2* 10/25/2007   HGBA1C 6.5 06/15/2013   MICROALBUR 26.2* 06/30/2010   A complex case    Assessment & Plan:

## 2013-09-26 NOTE — Progress Notes (Signed)
Pre visit review using our clinic review tool, if applicable. No additional management support is needed unless otherwise documented below in the visit note. 

## 2013-09-26 NOTE — Assessment & Plan Note (Signed)
W/c Rx

## 2013-10-01 ENCOUNTER — Telehealth: Payer: Self-pay | Admitting: Internal Medicine

## 2013-10-01 NOTE — Telephone Encounter (Signed)
Patient is asking that we fax her wheelchair rx and insurance information to Fax# [phone] for her hip fracture.

## 2013-10-02 NOTE — Telephone Encounter (Signed)
Pt call to check up on this request. Please advise.

## 2013-10-02 NOTE — Telephone Encounter (Signed)
pls do if not done - light wt w/c Thx

## 2013-10-03 MED ORDER — UNABLE TO FIND: Status: DC

## 2013-10-03 NOTE — Telephone Encounter (Signed)
Done. Pt informed.

## 2013-10-05 ENCOUNTER — Telehealth: Payer: Self-pay

## 2013-10-05 NOTE — Telephone Encounter (Signed)
Relevant patient education mailed to patient.  

## 2013-11-12 ENCOUNTER — Emergency Department (HOSPITAL_COMMUNITY)
Admission: EM | Admit: 2013-11-12 | Discharge: 2013-11-13 | Disposition: A | Discharge status: Home / Self Care | Payer: Medicare PPO | Attending: Emergency Medicine | Admitting: Emergency Medicine

## 2013-11-12 ENCOUNTER — Emergency Department (HOSPITAL_COMMUNITY): Payer: Medicare PPO

## 2013-11-12 ENCOUNTER — Encounter (HOSPITAL_COMMUNITY): Payer: Self-pay | Admitting: Emergency Medicine

## 2013-11-12 DIAGNOSIS — R32 Unspecified urinary incontinence: Secondary | ICD-10-CM | POA: Insufficient documentation

## 2013-11-12 DIAGNOSIS — Z79899 Other long term (current) drug therapy: Secondary | ICD-10-CM | POA: Insufficient documentation

## 2013-11-12 DIAGNOSIS — E559 Vitamin D deficiency, unspecified: Secondary | ICD-10-CM | POA: Insufficient documentation

## 2013-11-12 DIAGNOSIS — I10 Essential (primary) hypertension: Secondary | ICD-10-CM | POA: Insufficient documentation

## 2013-11-12 DIAGNOSIS — M25569 Pain in unspecified knee: Secondary | ICD-10-CM | POA: Insufficient documentation

## 2013-11-12 DIAGNOSIS — Z87891 Personal history of nicotine dependence: Secondary | ICD-10-CM | POA: Insufficient documentation

## 2013-11-12 DIAGNOSIS — K5730 Diverticulosis of large intestine without perforation or abscess without bleeding: Secondary | ICD-10-CM | POA: Insufficient documentation

## 2013-11-12 DIAGNOSIS — E1169 Type 2 diabetes mellitus with other specified complication: Secondary | ICD-10-CM | POA: Insufficient documentation

## 2013-11-12 DIAGNOSIS — Z880 Allergy status to penicillin: Secondary | ICD-10-CM | POA: Insufficient documentation

## 2013-11-12 DIAGNOSIS — D219 Benign neoplasm of connective and other soft tissue, unspecified: Secondary | ICD-10-CM | POA: Insufficient documentation

## 2013-11-12 DIAGNOSIS — E875 Hyperkalemia: Secondary | ICD-10-CM | POA: Insufficient documentation

## 2013-11-12 DIAGNOSIS — Z888 Allergy status to other drugs, medicaments and biological substances status: Secondary | ICD-10-CM | POA: Insufficient documentation

## 2013-11-12 DIAGNOSIS — K2990 Gastroduodenitis, unspecified, without bleeding: Secondary | ICD-10-CM

## 2013-11-12 DIAGNOSIS — M109 Gout, unspecified: Secondary | ICD-10-CM | POA: Insufficient documentation

## 2013-11-12 DIAGNOSIS — E162 Hypoglycemia, unspecified: Secondary | ICD-10-CM

## 2013-11-12 DIAGNOSIS — K2970 Gastritis, unspecified, without bleeding: Secondary | ICD-10-CM | POA: Insufficient documentation

## 2013-11-12 DIAGNOSIS — K219 Gastro-esophageal reflux disease without esophagitis: Secondary | ICD-10-CM | POA: Insufficient documentation

## 2013-11-12 DIAGNOSIS — M1990 Unspecified osteoarthritis, unspecified site: Secondary | ICD-10-CM | POA: Insufficient documentation

## 2013-11-12 DIAGNOSIS — Z7982 Long term (current) use of aspirin: Secondary | ICD-10-CM | POA: Insufficient documentation

## 2013-11-12 LAB — CBC WITH DIFFERENTIAL/PLATELET
Basophils Absolute: 0 10*3/uL (ref 0.0–0.1)
Basophils Relative: 0 % (ref 0–1)
EOS ABS: 0.1 10*3/uL (ref 0.0–0.7)
Eosinophils Relative: 2 % (ref 0–5)
HCT: 30.7 % — ABNORMAL LOW (ref 36.0–46.0)
Hemoglobin: 10.1 g/dL — ABNORMAL LOW (ref 12.0–15.0)
Lymphocytes Relative: 25 % (ref 12–46)
Lymphs Abs: 1.7 10*3/uL (ref 0.7–4.0)
MCH: 26.9 pg (ref 26.0–34.0)
MCHC: 32.9 g/dL (ref 30.0–36.0)
MCV: 81.6 fL (ref 78.0–100.0)
MONO ABS: 0.6 10*3/uL (ref 0.1–1.0)
MONOS PCT: 9 % (ref 3–12)
NEUTROS PCT: 64 % (ref 43–77)
Neutro Abs: 4.5 10*3/uL (ref 1.7–7.7)
Platelets: 214 10*3/uL (ref 150–400)
RBC: 3.76 MIL/uL — ABNORMAL LOW (ref 3.87–5.11)
RDW: 14.4 % (ref 11.5–15.5)
WBC: 6.9 10*3/uL (ref 4.0–10.5)

## 2013-11-12 LAB — CBG MONITORING, ED: GLUCOSE-CAPILLARY: 118 mg/dL — AB (ref 70–99)

## 2013-11-12 NOTE — ED Notes (Signed)
Took Pts CBG it was 118 reported to nurse.

## 2013-11-12 NOTE — ED Notes (Signed)
Discussed with the patient that she needs to be checking blood sugar at home.  She reports she has equipment available to do so, but just "never checks it".  Discussed symptoms of low blood sugar, and high blood sugar.

## 2013-11-12 NOTE — ED Notes (Signed)
Pt states that she was having some numbness in her face around her lips and in the roof of her mouth.  Pt was noted to have CBG of 57 and given one amp of d50 which has stopped the numbness.  Pt has no complaints at this time

## 2013-11-12 NOTE — ED Provider Notes (Signed)
CSN: [id number]     Arrival date & time 11/12/13  2153 History   First MD Initiated Contact with Patient 11/12/13 2207     Chief Complaint  Patient presents with  . Numbness     (Consider location/radiation/quality/duration/timing/severity/associated sxs/prior Treatment) Patient is a 80 y.o. female presenting with neurologic complaint.  Neurologic Problem This is a new problem. The current episode started today. The problem has been resolved. Associated symptoms include numbness (perioral). Pertinent negatives include no abdominal pain, chest pain, chills, congestion, coughing, fatigue, fever, nausea, neck pain, rash or vomiting. Nothing aggravates the symptoms. Treatments tried: amp D50 PTA w/ EMS. The treatment provided significant relief.    Past Medical History  Diagnosis Date  . Leiomyoma 2-02    colonic polypoid   . Diabetes mellitus     type II  . GERD (gastroesophageal reflux disease)   . Gout   . Hypertension   . Osteoarthritis   . Urinary incontinence   . Morbid obesity   . Pain in joint, lower leg   . Unspecified vitamin D deficiency   . Hyperkalemia   . Gastritis     w/ hx of h. pylori  . Diverticulosis    Past Surgical History  Procedure Laterality Date  . Right tkr    . Thr left    . Thr left 3/4    . Total hip arthroplasty  2009    right  . Cholecystectomy    . Total hip arthroplasty      left  . Lithotripsy    . Orif femur fracture N/A 07/12/2013    Procedure: OPEN REDUCTION INTERNAL FIXATION (ORIF) DISTAL FEMUR FRACTURE;  Surgeon: Hessie Dibble, MD;  Location: [place];  Service: Orthopedics;  Laterality: N/A;   Family History  Problem Relation Age of Onset  . Cancer Sister     breast, stomach  . Diabetes Sister   . Diabetes Other    History  Substance Use Topics  . Smoking status: Former Smoker    Quit date: 06/07/1960  . Smokeless tobacco: Never Used  . Alcohol Use: No   OB History   Grav Para Term Preterm Abortions TAB SAB Ect Mult  Living                 Review of Systems  Constitutional: Negative for fever, chills and fatigue.  HENT: Negative for congestion and rhinorrhea.   Eyes: Negative for pain and discharge.  Respiratory: Negative for cough, chest tightness, shortness of breath and wheezing.   Cardiovascular: Negative for chest pain and palpitations.  Gastrointestinal: Negative for nausea, vomiting and abdominal pain.  Endocrine: Negative for polydipsia and polyuria.  Genitourinary: Negative for dysuria and frequency.  Musculoskeletal: Negative for gait problem and neck pain.  Skin: Negative for rash.  Neurological: Positive for numbness (perioral). Negative for dizziness, seizures, syncope and light-headedness.      Allergies  Ditropan and Penicillins  Home Medications   Prior to Admission medications   Medication Sig Start Date End Date Taking? Authorizing Provider  allopurinol (ZYLOPRIM) 300 MG tablet Take 300 mg by mouth daily. For gout 05/10/13  Yes Aleksei Plotnikov V, MD  aspirin 81 MG tablet Take 81 mg by mouth daily. 08/11/13  Yes Historical Provider, MD  Cholecalciferol (EQL VITAMIN D3) 1000 UNITS tablet Take 1,000 Units by mouth daily. For supplement   Yes Historical Provider, MD  glimepiride (AMARYL) 4 MG tablet Take 4 mg by mouth 2 (two) times daily. For diabetes 05/10/13  Yes Aleksei Plotnikov V, MD  losartan (COZAAR) 50 MG tablet Take 50 mg by mouth daily. For HTN 05/10/13 05/10/14 Yes Aleksei Plotnikov V, MD  metFORMIN (GLUCOPHAGE) 500 MG tablet Take 500 mg by mouth 2 (two) times daily with a meal. For diabetes 05/10/13  Yes Aleksei Plotnikov V, MD  potassium chloride (KLOR-CON 10) 10 MEQ tablet Take 1 tablet (10 mEq total) by mouth daily. 05/09/13  Yes Aleksei Plotnikov V, MD  verapamil (VERELAN PM) 360 MG 24 hr capsule Take 360 mg by mouth at bedtime. For HTN 05/10/13  Yes Aleksei Plotnikov V, MD  vitamin B-12 (CYANOCOBALAMIN) 1000 MCG tablet Take 1,000 mcg by mouth daily.     Yes Historical  Provider, MD  HYDROcodone-acetaminophen (NORCO) 7.5-325 MG per tablet Take 0.5-1 tablets by mouth every 8 (eight) hours as needed. Please fill on or after 05/15/13 07/16/13 10/20/13  Otelia Limes Carnaghi, PA-C  UNABLE TO FIND Light weight wheelchair with needed supplies. Dx:821.00 10/03/13   Aleksei Plotnikov V, MD   BP 158/69  Pulse 83  Temp(Src) 98.2 F (36.8 C) (Oral)  Resp 16  Ht 5\' 3"  (1.6 m)  Wt 230 lb (104.327 kg)  BMI 40.75 kg/m2  SpO2 98% Physical Exam  Nursing note and vitals reviewed. Constitutional: She is oriented to person, place, and time. She appears well-developed and well-nourished.  HENT:  Head: Normocephalic and atraumatic.  Eyes: Conjunctivae and EOM are normal. Right eye exhibits no discharge. Left eye exhibits no discharge.  Cardiovascular: Normal rate and regular rhythm.   Pulmonary/Chest: Effort normal and breath sounds normal. No respiratory distress.  Abdominal: Soft. She exhibits no distension. There is no tenderness. There is no rebound.  Musculoskeletal: Normal range of motion. She exhibits no edema and no tenderness.  Neurological: She is alert and oriented to person, place, and time.  Skin: Skin is warm and dry.    ED Course  Procedures (including critical care time) Labs Review Labs Reviewed  CBG MONITORING, ED    Imaging Review No results found.   EKG Interpretation None      MDM   80 yo F w/ h/o DM on amaryl and metformin here with perioral anesthesia and hypoglcyemia. Improved with D50 and currently symptoms free. Exam benign as above, no focal neuro deficits. Tolerating PO here and symptoms totally resolved with normal blood sugar.  Doubt CVA, more likely related to hypoglycemia. Will check cxr, ecg, labs and urine to evaluate for causes of hypoglycemia. Will recheck poct glucose to ensure stable numbers and if labs ok will d/c.  Patient persistently asymptomatic. Tolerating PO. Stable for d/c. Will take sugars and treat hereself as  necessary. Will not take morning dose of amaryl. Will follow up with pcp in 2-3 days for adjustments as needed.    Final diagnoses:  Hypoglycemia      Merrily Pew, MD 11/14/13 2044

## 2013-11-13 ENCOUNTER — Telehealth: Payer: Self-pay | Admitting: Internal Medicine

## 2013-11-13 LAB — BASIC METABOLIC PANEL
BUN: 17 mg/dL (ref 6–23)
CO2: 25 mEq/L (ref 19–32)
CREATININE: 0.85 mg/dL (ref 0.50–1.10)
Calcium: 9.6 mg/dL (ref 8.4–10.5)
Chloride: 101 mEq/L (ref 96–112)
GFR calc Af Amer: 71 mL/min — ABNORMAL LOW (ref >90)
GFR, EST NON AFRICAN AMERICAN: 62 mL/min — AB (ref >90)
Glucose, Bld: 71 mg/dL (ref 70–99)
Potassium: 4.5 mEq/L (ref 3.7–5.3)
Sodium: 141 mEq/L (ref 137–147)

## 2013-11-13 LAB — URINALYSIS, ROUTINE W REFLEX MICROSCOPIC
BILIRUBIN URINE: NEGATIVE
Glucose, UA: NEGATIVE mg/dL
Hgb urine dipstick: NEGATIVE
KETONES UR: NEGATIVE mg/dL
Leukocytes, UA: NEGATIVE
NITRITE: NEGATIVE
Protein, ur: NEGATIVE mg/dL
Specific Gravity, Urine: 1.007 (ref 1.005–1.030)
UROBILINOGEN UA: 0.2 mg/dL (ref 0.0–1.0)
pH: 7 (ref 5.0–8.0)

## 2013-11-13 LAB — CBG MONITORING, ED: Glucose-Capillary: 76 mg/dL (ref 70–99)

## 2013-11-13 NOTE — Discharge Instructions (Signed)
Please check blood sugars in the morning and at night and anytime having symptoms. Please schedule follow up with your PCP tomorrow to ensure you have proper medications.

## 2013-11-13 NOTE — ED Notes (Signed)
Discussed need to void for sample. Patient will attempt to void now.

## 2013-11-13 NOTE — Telephone Encounter (Signed)
Patient was seen in the ED last night, diagnosis was hypoglycemia. Patient's daughter states that they asked her to f/u with Dr. Alain Marion today. No opening in the office today. Daughter asked a message to be sent to see what they should do and if patient needs to be worked in today. Please advise.

## 2013-11-13 NOTE — ED Notes (Addendum)
Reported CBG of 76 to ED Provider, Dr. Dayna Barker, and provided orange juice.  MD acknowledges an will prepare for discharge.

## 2013-11-14 MED ORDER — GLIMEPIRIDE 4 MG PO TABS: 2.0000 mg | ORAL_TABLET | Freq: Two times a day (BID) | ORAL | Status: DC

## 2013-11-14 NOTE — Telephone Encounter (Signed)
Take Amaryl 1/2 tab bid OV w/avail MD Thx

## 2013-11-15 NOTE — Telephone Encounter (Signed)
Pt and daughter informed. 

## 2013-11-16 ENCOUNTER — Encounter: Payer: Self-pay | Admitting: Internal Medicine

## 2013-11-16 ENCOUNTER — Ambulatory Visit (INDEPENDENT_AMBULATORY_CARE_PROVIDER_SITE_OTHER): Payer: Medicare PPO | Admitting: Internal Medicine

## 2013-11-16 VITALS — BP 130/80 | HR 72 | Temp 98.6°F | Resp 16 | Wt 232.0 lb

## 2013-11-16 DIAGNOSIS — E559 Vitamin D deficiency, unspecified: Secondary | ICD-10-CM

## 2013-11-16 DIAGNOSIS — E119 Type 2 diabetes mellitus without complications: Secondary | ICD-10-CM

## 2013-11-16 DIAGNOSIS — E538 Deficiency of other specified B group vitamins: Secondary | ICD-10-CM

## 2013-11-16 DIAGNOSIS — I10 Essential (primary) hypertension: Secondary | ICD-10-CM

## 2013-11-16 DIAGNOSIS — R209 Unspecified disturbances of skin sensation: Secondary | ICD-10-CM

## 2013-11-16 DIAGNOSIS — R202 Paresthesia of skin: Secondary | ICD-10-CM | POA: Insufficient documentation

## 2013-11-16 MED ORDER — GLIMEPIRIDE 2 MG PO TABS: 2.0000 mg | ORAL_TABLET | Freq: Two times a day (BID) | ORAL | Status: DC

## 2013-11-16 NOTE — Assessment & Plan Note (Signed)
Wt Readings from Last 3 Encounters:  11/16/13 232 lb (105.235 kg)  11/12/13 230 lb (104.327 kg)  09/26/13 230 lb 8 oz (104.554 kg)

## 2013-11-16 NOTE — Assessment & Plan Note (Signed)
Continue with current prescription therapy as reflected on the Med list.  

## 2013-11-16 NOTE — Assessment & Plan Note (Signed)
6/15 - hypoglycemia We reduced Amaryl

## 2013-11-16 NOTE — Progress Notes (Signed)
Pre visit review using our clinic review tool, if applicable. No additional management support is needed unless otherwise documented below in the visit note. 

## 2013-11-16 NOTE — Progress Notes (Signed)
Subjective:     HPI F/u ER visit on 11/10/13 - hypoglycemia at 96:  "80 yo F w/ h/o DM on amaryl and metformin here with perioral anesthesia and hypoglcyemia. Improved with D50 and currently symptoms free. Exam benign as above, no focal neuro deficits. Tolerating PO here and symptoms totally resolved with normal blood sugar.  Doubt CVA, more likely related to hypoglycemia. Will check cxr, ecg, labs and urine to evaluate for causes of hypoglycemia. Will recheck poct glucose to ensure stable numbers and if labs ok will d/c. Patient persistently asymptomatic. Tolerating PO. Stable for d/c. Will take sugars and treat hereself as necessary. Will not take morning dose of amaryl. Will follow up with pcp in 2-3 days for adjustments as needed."   H/o fracture of femur, right, closed - 2/15; back home   The patient presents for a follow-up of  chronic hypertension, chronic dyslipidemia, type 2 diabetes controlled with medicines.   F/u L>R knee pain off and on - stable  Wt Readings from Last 3 Encounters:  11/16/13 232 lb (105.235 kg)  11/12/13 230 lb (104.327 kg)  09/26/13 230 lb 8 oz (104.554 kg)   BP Readings from Last 3 Encounters:  11/16/13 130/80  11/13/13 133/69  09/26/13 150/70       Review of Systems  Constitutional: Negative for activity change, appetite change, fatigue and unexpected weight change.  HENT: Negative for congestion, mouth sores and sinus pressure.   Eyes: Negative for visual disturbance.  Respiratory: Negative for chest tightness.   Gastrointestinal: Negative for nausea and abdominal pain.  Genitourinary: Negative for frequency, difficulty urinating and vaginal pain.  Musculoskeletal: Positive for arthralgias and gait problem. Negative for back pain.  Skin: Negative for pallor.  Neurological: Negative for dizziness, tremors and weakness.  Psychiatric/Behavioral: Negative for confusion and sleep disturbance. The patient is not nervous/anxious.         Objective:   Physical Exam  Constitutional: She appears well-developed. No distress.  obese  HENT:  Head: Normocephalic.  Right Ear: External ear normal.  Left Ear: External ear normal.  Nose: Nose normal.  Mouth/Throat: Oropharynx is clear and moist.  Eyes: Conjunctivae are normal. Pupils are equal, round, and reactive to light. Right eye exhibits no discharge. Left eye exhibits no discharge.  Neck: Normal range of motion. Neck supple. No JVD present. No tracheal deviation present. No thyromegaly present.  Cardiovascular: Normal rate, regular rhythm and normal heart sounds.   Pulmonary/Chest: No stridor. No respiratory distress. She has no wheezes.  Abdominal: Soft. Bowel sounds are normal. She exhibits no distension and no mass. There is no tenderness. There is no rebound and no guarding.  Musculoskeletal: She exhibits tenderness. She exhibits no edema.  Using a walker  Lymphadenopathy:    She has no cervical adenopathy.  Neurological: She displays normal reflexes. No cranial nerve deficit. She exhibits normal muscle tone. Coordination abnormal.  Skin: No rash noted. No erythema.  Psychiatric: She has a normal mood and affect. Her behavior is normal. Judgment and thought content normal.   W/c   Lab Results  Component Value Date   WBC 6.9 11/12/2013   HGB 10.1* 11/12/2013   HCT 30.7* 11/12/2013   PLT 214 11/12/2013   GLUCOSE 71 11/12/2013   CHOL 198 05/04/2011   TRIG 116.0 05/04/2011   HDL 62.30 05/04/2011   LDLDIRECT 126.1 03/31/2010   LDLCALC 113* 05/04/2011   ALT 15 05/04/2011   AST 18 05/04/2011   NA 141 11/12/2013   K  4.5 11/12/2013   CL 101 11/12/2013   CREATININE 0.85 11/12/2013   BUN 17 11/12/2013   CO2 25 11/12/2013   TSH 2.92 08/01/2012   INR 2.2* 10/25/2007   HGBA1C 6.5 06/15/2013   MICROALBUR 26.2* 06/30/2010       Assessment & Plan:

## 2013-11-16 NOTE — Assessment & Plan Note (Signed)
6/15 face - transient due to low CBGs Resolved

## 2013-11-17 ENCOUNTER — Telehealth: Payer: Self-pay | Admitting: Internal Medicine

## 2013-11-17 NOTE — ED Provider Notes (Signed)
I saw and evaluated the patient, reviewed the resident's note and I agree with the findings and plan.   EKG Interpretation   Date/Time:  Monday November 12 2013 22:00:02 EDT Ventricular Rate:  87 PR Interval:  185 QRS Duration: 96 QT Interval:  421 QTC Calculation: 506 R Axis:   20 Text Interpretation:  Sinus rhythm Supraventricular bigeminy Low voltage,  precordial leads Prolonged QT interval Reconfirmed by Alvino Chapel  MD,  Ovid Curd [phone]) on 11/17/2013 7:06:44 AM     Patient with altered mental status and numbness. Found to be hypoglycemic. Improved with raising her sugar and did not return. She has remained normal and will be discharged  Jasper Riling. Alvino Chapel, [place] 11/17/13 [phone]

## 2013-11-17 NOTE — Telephone Encounter (Signed)
Relevant patient education mailed to patient.  

## 2013-11-20 ENCOUNTER — Encounter: Payer: Self-pay | Admitting: Internal Medicine

## 2013-12-28 ENCOUNTER — Ambulatory Visit: Payer: Medicare PPO | Admitting: Internal Medicine

## 2014-01-01 ENCOUNTER — Telehealth: Payer: Self-pay

## 2014-01-01 ENCOUNTER — Ambulatory Visit (INDEPENDENT_AMBULATORY_CARE_PROVIDER_SITE_OTHER)
Admission: RE | Admit: 2014-01-01 | Discharge: 2014-01-01 | Disposition: A | Discharge status: Home / Self Care | Payer: Medicare PPO | Source: Ambulatory Visit | Attending: Internal Medicine | Admitting: Internal Medicine

## 2014-01-01 DIAGNOSIS — S7290XA Unspecified fracture of unspecified femur, initial encounter for closed fracture: Secondary | ICD-10-CM

## 2014-01-01 DIAGNOSIS — Z13820 Encounter for screening for osteoporosis: Secondary | ICD-10-CM

## 2014-01-01 NOTE — Telephone Encounter (Signed)
Bone Density Ordered.

## 2014-02-15 ENCOUNTER — Other Ambulatory Visit: Payer: Self-pay | Admitting: *Deleted

## 2014-02-15 MED ORDER — GLIMEPIRIDE 2 MG PO TABS: 2.0000 mg | ORAL_TABLET | Freq: Two times a day (BID) | ORAL | Status: DC

## 2014-02-19 ENCOUNTER — Other Ambulatory Visit (INDEPENDENT_AMBULATORY_CARE_PROVIDER_SITE_OTHER): Payer: Medicare PPO

## 2014-02-19 DIAGNOSIS — E538 Deficiency of other specified B group vitamins: Secondary | ICD-10-CM

## 2014-02-19 DIAGNOSIS — E119 Type 2 diabetes mellitus without complications: Secondary | ICD-10-CM

## 2014-02-19 DIAGNOSIS — I10 Essential (primary) hypertension: Secondary | ICD-10-CM

## 2014-02-19 LAB — BASIC METABOLIC PANEL
BUN: 19 mg/dL (ref 6–23)
CALCIUM: 9.7 mg/dL (ref 8.4–10.5)
CO2: 27 meq/L (ref 19–32)
Chloride: 104 mEq/L (ref 96–112)
Creatinine, Ser: 0.8 mg/dL (ref 0.4–1.2)
GFR: 90.58 mL/min (ref >60.00)
GLUCOSE: 108 mg/dL — AB (ref 70–99)
Potassium: 3.9 mEq/L (ref 3.5–5.1)
Sodium: 141 mEq/L (ref 135–145)

## 2014-02-19 LAB — HEMOGLOBIN A1C: Hgb A1c MFr Bld: 5.8 % (ref 4.6–6.5)

## 2014-02-22 ENCOUNTER — Encounter: Payer: Self-pay | Admitting: Internal Medicine

## 2014-02-22 ENCOUNTER — Ambulatory Visit (INDEPENDENT_AMBULATORY_CARE_PROVIDER_SITE_OTHER): Payer: Medicare PPO | Admitting: Internal Medicine

## 2014-02-22 ENCOUNTER — Telehealth: Payer: Self-pay | Admitting: Internal Medicine

## 2014-02-22 VITALS — BP 142/78 | HR 76 | Temp 98.7°F | Resp 16 | Wt 236.0 lb

## 2014-02-22 DIAGNOSIS — E538 Deficiency of other specified B group vitamins: Secondary | ICD-10-CM

## 2014-02-22 DIAGNOSIS — E119 Type 2 diabetes mellitus without complications: Secondary | ICD-10-CM

## 2014-02-22 DIAGNOSIS — Z23 Encounter for immunization: Secondary | ICD-10-CM

## 2014-02-22 DIAGNOSIS — I10 Essential (primary) hypertension: Secondary | ICD-10-CM

## 2014-02-22 NOTE — Progress Notes (Signed)
Pre visit review using our clinic review tool, if applicable. No additional management support is needed unless otherwise documented below in the visit note. 

## 2014-02-22 NOTE — Telephone Encounter (Signed)
Can you please call patient in regards to when her next labs are

## 2014-02-22 NOTE — Progress Notes (Signed)
   Subjective:     HPI    H/o fracture of femur, right, closed - 2/15 - doing well   The patient presents for a follow-up of  chronic hypertension, chronic dyslipidemia, type 2 diabetes controlled with medicines.   F/u L>R knee pain off and on - stable  Wt Readings from Last 3 Encounters:  02/22/14 236 lb (107.049 kg)  11/16/13 232 lb (105.235 kg)  11/12/13 230 lb (104.327 kg)   BP Readings from Last 3 Encounters:  02/22/14 142/78  11/16/13 130/80  11/13/13 133/69       Review of Systems  Constitutional: Negative for activity change, appetite change, fatigue and unexpected weight change.  HENT: Negative for congestion, mouth sores and sinus pressure.   Eyes: Negative for visual disturbance.  Respiratory: Negative for chest tightness.   Gastrointestinal: Negative for nausea and abdominal pain.  Genitourinary: Negative for frequency, difficulty urinating and vaginal pain.  Musculoskeletal: Positive for arthralgias and gait problem. Negative for back pain.  Skin: Negative for pallor.  Neurological: Negative for dizziness, tremors and weakness.  Psychiatric/Behavioral: Negative for confusion and sleep disturbance. The patient is not nervous/anxious.        Objective:   Physical Exam  Constitutional: She appears well-developed. No distress.  obese  HENT:  Head: Normocephalic.  Right Ear: External ear normal.  Left Ear: External ear normal.  Nose: Nose normal.  Mouth/Throat: Oropharynx is clear and moist.  Eyes: Conjunctivae are normal. Pupils are equal, round, and reactive to light. Right eye exhibits no discharge. Left eye exhibits no discharge.  Neck: Normal range of motion. Neck supple. No JVD present. No tracheal deviation present. No thyromegaly present.  Cardiovascular: Normal rate, regular rhythm and normal heart sounds.   Pulmonary/Chest: No stridor. No respiratory distress. She has no wheezes.  Abdominal: Soft. Bowel sounds are normal. She exhibits no  distension and no mass. There is no tenderness. There is no rebound and no guarding.  Musculoskeletal: She exhibits tenderness. She exhibits no edema.  Using a walker  Lymphadenopathy:    She has no cervical adenopathy.  Neurological: She displays normal reflexes. No cranial nerve deficit. She exhibits normal muscle tone. Coordination abnormal.  Skin: No rash noted. No erythema.  Psychiatric: She has a normal mood and affect. Her behavior is normal. Judgment and thought content normal.   Using a walker   Lab Results  Component Value Date   WBC 6.9 11/12/2013   HGB 10.1* 11/12/2013   HCT 30.7* 11/12/2013   PLT 214 11/12/2013   GLUCOSE 108* 02/19/2014   CHOL 198 05/04/2011   TRIG 116.0 05/04/2011   HDL 62.30 05/04/2011   LDLDIRECT 126.1 03/31/2010   LDLCALC 113* 05/04/2011   ALT 15 05/04/2011   AST 18 05/04/2011   NA 141 02/19/2014   K 3.9 02/19/2014   CL 104 02/19/2014   CREATININE 0.8 02/19/2014   BUN 19 02/19/2014   CO2 27 02/19/2014   TSH 2.92 08/01/2012   INR 2.2* 10/25/2007   HGBA1C 5.8 02/19/2014   MICROALBUR 26.2* 06/30/2010       Assessment & Plan:

## 2014-02-26 NOTE — Telephone Encounter (Signed)
Called pt no answer unable to leave message

## 2014-03-03 NOTE — Assessment & Plan Note (Signed)
Continue with current prescription therapy as reflected on the Med list.  

## 2014-03-03 NOTE — Assessment & Plan Note (Signed)
Wt Readings from Last 3 Encounters:  02/22/14 236 lb (107.049 kg)  11/16/13 232 lb (105.235 kg)  11/12/13 230 lb (104.327 kg)

## 2014-05-29 ENCOUNTER — Ambulatory Visit: Payer: Medicare PPO | Admitting: Internal Medicine

## 2014-06-22 ENCOUNTER — Encounter: Payer: Self-pay | Admitting: Internal Medicine

## 2014-06-26 ENCOUNTER — Other Ambulatory Visit (INDEPENDENT_AMBULATORY_CARE_PROVIDER_SITE_OTHER): Payer: Medicare PPO

## 2014-06-26 DIAGNOSIS — E539 Vitamin B deficiency, unspecified: Secondary | ICD-10-CM

## 2014-06-26 DIAGNOSIS — E119 Type 2 diabetes mellitus without complications: Secondary | ICD-10-CM

## 2014-06-26 DIAGNOSIS — I10 Essential (primary) hypertension: Secondary | ICD-10-CM

## 2014-06-26 LAB — HEMOGLOBIN A1C: Hgb A1c MFr Bld: 5.8 % (ref 4.6–6.5)

## 2014-06-26 LAB — BASIC METABOLIC PANEL
BUN: 19 mg/dL (ref 6–23)
CALCIUM: 9.7 mg/dL (ref 8.4–10.5)
CO2: 30 mEq/L (ref 19–32)
CREATININE: 0.77 mg/dL (ref 0.40–1.20)
Chloride: 104 mEq/L (ref 96–112)
GFR: 91.87 mL/min (ref >60.00)
Glucose, Bld: 153 mg/dL — ABNORMAL HIGH (ref 70–99)
POTASSIUM: 4.2 meq/L (ref 3.5–5.1)
Sodium: 140 mEq/L (ref 135–145)

## 2014-06-28 ENCOUNTER — Ambulatory Visit: Payer: Medicare PPO | Admitting: Internal Medicine

## 2014-07-05 ENCOUNTER — Encounter (INDEPENDENT_AMBULATORY_CARE_PROVIDER_SITE_OTHER): Payer: Self-pay

## 2014-07-05 ENCOUNTER — Ambulatory Visit (INDEPENDENT_AMBULATORY_CARE_PROVIDER_SITE_OTHER): Payer: Medicare PPO | Admitting: Internal Medicine

## 2014-07-05 ENCOUNTER — Encounter: Payer: Self-pay | Admitting: Internal Medicine

## 2014-07-05 VITALS — BP 160/90 | HR 83 | Temp 98.8°F | Wt 240.0 lb

## 2014-07-05 DIAGNOSIS — E538 Deficiency of other specified B group vitamins: Secondary | ICD-10-CM

## 2014-07-05 DIAGNOSIS — I10 Essential (primary) hypertension: Secondary | ICD-10-CM

## 2014-07-05 DIAGNOSIS — E1122 Type 2 diabetes mellitus with diabetic chronic kidney disease: Secondary | ICD-10-CM

## 2014-07-05 DIAGNOSIS — N183 Chronic kidney disease, stage 3 (moderate): Secondary | ICD-10-CM

## 2014-07-05 MED ORDER — HYDROCODONE-ACETAMINOPHEN 5-325 MG PO TABS: 1.0000 | ORAL_TABLET | Freq: Four times a day (QID) | ORAL | Status: DC | PRN

## 2014-07-05 MED ORDER — METFORMIN HCL 500 MG PO TABS: 500.0000 mg | ORAL_TABLET | Freq: Two times a day (BID) | ORAL | Status: DC

## 2014-07-05 MED ORDER — GLIMEPIRIDE 2 MG PO TABS: 2.0000 mg | ORAL_TABLET | Freq: Two times a day (BID) | ORAL | Status: DC

## 2014-07-05 MED ORDER — VERAPAMIL HCL ER 360 MG PO CP24: 360.0000 mg | ORAL_CAPSULE | Freq: Every day | ORAL | Status: DC

## 2014-07-05 MED ORDER — ALLOPURINOL 300 MG PO TABS: 300.0000 mg | ORAL_TABLET | Freq: Every day | ORAL | Status: DC

## 2014-07-05 MED ORDER — LOSARTAN POTASSIUM 50 MG PO TABS: 50.0000 mg | ORAL_TABLET | Freq: Every day | ORAL | Status: DC

## 2014-07-05 MED ORDER — POTASSIUM CHLORIDE ER 10 MEQ PO TBCR: 10.0000 meq | EXTENDED_RELEASE_TABLET | Freq: Every day | ORAL | Status: DC

## 2014-07-05 NOTE — Progress Notes (Signed)
Pre visit review using our clinic review tool, if applicable. No additional management support is needed unless otherwise documented below in the visit note. 

## 2014-07-05 NOTE — Assessment & Plan Note (Signed)
Continue with current prescription therapy as reflected on the Med list.  

## 2014-07-05 NOTE — Assessment & Plan Note (Signed)
Elevated BP Continue with current prescription therapy as reflected on the Med list.  Pain in the knee could be contributing

## 2014-07-05 NOTE — Progress Notes (Signed)
   Subjective:     HPI    H/o fracture of femur, right, closed - 2/15 - doing well. C/o R knee pain - severe   The patient presents for a follow-up of  chronic hypertension, chronic dyslipidemia, type 2 diabetes controlled with medicines, OA.     Wt Readings from Last 3 Encounters:  07/05/14 240 lb (108.863 kg)  02/22/14 236 lb (107.049 kg)  11/16/13 232 lb (105.235 kg)   BP Readings from Last 3 Encounters:  07/05/14 160/90  02/22/14 142/78  11/16/13 130/80       Review of Systems  Constitutional: Negative for activity change, appetite change, fatigue and unexpected weight change.  HENT: Negative for congestion, mouth sores and sinus pressure.   Eyes: Negative for visual disturbance.  Respiratory: Negative for chest tightness.   Gastrointestinal: Negative for nausea and abdominal pain.  Genitourinary: Negative for frequency, difficulty urinating and vaginal pain.  Musculoskeletal: Positive for arthralgias and gait problem. Negative for back pain.  Skin: Negative for pallor.  Neurological: Negative for dizziness, tremors and weakness.  Psychiatric/Behavioral: Negative for confusion and sleep disturbance. The patient is not nervous/anxious.        Objective:   Physical Exam  Constitutional: She appears well-developed. No distress.  obese  HENT:  Head: Normocephalic.  Right Ear: External ear normal.  Left Ear: External ear normal.  Nose: Nose normal.  Mouth/Throat: Oropharynx is clear and moist.  Eyes: Conjunctivae are normal. Pupils are equal, round, and reactive to light. Right eye exhibits no discharge. Left eye exhibits no discharge.  Neck: Normal range of motion. Neck supple. No JVD present. No tracheal deviation present. No thyromegaly present.  Cardiovascular: Normal rate, regular rhythm and normal heart sounds.   Pulmonary/Chest: No stridor. No respiratory distress. She has no wheezes.  Abdominal: Soft. Bowel sounds are normal. She exhibits no distension  and no mass. There is no tenderness. There is no rebound and no guarding.  Musculoskeletal: She exhibits tenderness. She exhibits no edema.  Using a walker  Lymphadenopathy:    She has no cervical adenopathy.  Neurological: She displays normal reflexes. No cranial nerve deficit. She exhibits normal muscle tone. Coordination abnormal.  Skin: No rash noted. No erythema.  Psychiatric: She has a normal mood and affect. Her behavior is normal. Judgment and thought content normal.  R knee is painful Using a walker   Lab Results  Component Value Date   WBC 6.9 11/12/2013   HGB 10.1* 11/12/2013   HCT 30.7* 11/12/2013   PLT 214 11/12/2013   GLUCOSE 153* 06/26/2014   CHOL 198 05/04/2011   TRIG 116.0 05/04/2011   HDL 62.30 05/04/2011   LDLDIRECT 126.1 03/31/2010   LDLCALC 113* 05/04/2011   ALT 15 05/04/2011   AST 18 05/04/2011   NA 140 06/26/2014   K 4.2 06/26/2014   CL 104 06/26/2014   CREATININE 0.77 06/26/2014   BUN 19 06/26/2014   CO2 30 06/26/2014   TSH 2.92 08/01/2012   INR 2.2* 10/25/2007   HGBA1C 5.8 06/26/2014   MICROALBUR 26.2* 06/30/2010       Assessment & Plan:  Patient ID: Holly Banks, female   DOB: July 29, 1928, 81 y.o.   MRN: [id number]

## 2014-11-05 ENCOUNTER — Other Ambulatory Visit (INDEPENDENT_AMBULATORY_CARE_PROVIDER_SITE_OTHER): Payer: Medicare PPO

## 2014-11-05 DIAGNOSIS — E119 Type 2 diabetes mellitus without complications: Secondary | ICD-10-CM

## 2014-11-05 LAB — BASIC METABOLIC PANEL
BUN: 19 mg/dL (ref 6–23)
CO2: 28 mEq/L (ref 19–32)
Calcium: 9.6 mg/dL (ref 8.4–10.5)
Chloride: 107 mEq/L (ref 96–112)
Creatinine, Ser: 0.8 mg/dL (ref 0.40–1.20)
GFR: 87.83 mL/min (ref >60.00)
Glucose, Bld: 133 mg/dL — ABNORMAL HIGH (ref 70–99)
Potassium: 4.1 mEq/L (ref 3.5–5.1)
Sodium: 141 mEq/L (ref 135–145)

## 2014-11-05 LAB — HEMOGLOBIN A1C: Hgb A1c MFr Bld: 5.6 % (ref 4.6–6.5)

## 2014-11-08 ENCOUNTER — Encounter: Payer: Self-pay | Admitting: Internal Medicine

## 2014-11-08 ENCOUNTER — Ambulatory Visit (INDEPENDENT_AMBULATORY_CARE_PROVIDER_SITE_OTHER): Payer: Medicare PPO | Admitting: Internal Medicine

## 2014-11-08 VITALS — BP 130/78 | HR 77 | Wt 240.0 lb

## 2014-11-08 DIAGNOSIS — E1122 Type 2 diabetes mellitus with diabetic chronic kidney disease: Secondary | ICD-10-CM | POA: Diagnosis not present

## 2014-11-08 DIAGNOSIS — N183 Chronic kidney disease, stage 3 unspecified: Secondary | ICD-10-CM

## 2014-11-08 DIAGNOSIS — E538 Deficiency of other specified B group vitamins: Secondary | ICD-10-CM | POA: Diagnosis not present

## 2014-11-08 DIAGNOSIS — M1000 Idiopathic gout, unspecified site: Secondary | ICD-10-CM

## 2014-11-08 DIAGNOSIS — I10 Essential (primary) hypertension: Secondary | ICD-10-CM

## 2014-11-08 DIAGNOSIS — E559 Vitamin D deficiency, unspecified: Secondary | ICD-10-CM

## 2014-11-08 DIAGNOSIS — M159 Polyosteoarthritis, unspecified: Secondary | ICD-10-CM

## 2014-11-08 DIAGNOSIS — M150 Primary generalized (osteo)arthritis: Secondary | ICD-10-CM

## 2014-11-08 MED ORDER — HYDROXYZINE HCL 10 MG PO TABS: ORAL_TABLET | ORAL | Status: DC

## 2014-11-08 MED ORDER — HYDROCODONE-ACETAMINOPHEN 5-325 MG PO TABS: 1.0000 | ORAL_TABLET | Freq: Four times a day (QID) | ORAL | Status: DC | PRN

## 2014-11-08 NOTE — Assessment & Plan Note (Signed)
  On Metformin, Glimepiride  Labs

## 2014-11-08 NOTE — Assessment & Plan Note (Signed)
On B12 

## 2014-11-08 NOTE — Assessment & Plan Note (Signed)
Chronic, severe Norco prn  Potential benefits of a long term opioids use as well as potential risks (i.e. addiction risk, apnea etc) and complications (i.e. Somnolence, constipation and others) were explained to the patient and were aknowledged. 

## 2014-11-08 NOTE — Assessment & Plan Note (Signed)
Wt Readings from Last 3 Encounters:  11/08/14 240 lb (108.863 kg)  07/05/14 240 lb (108.863 kg)  02/22/14 236 lb (107.049 kg)

## 2014-11-08 NOTE — Assessment & Plan Note (Signed)
On Allopurinol 

## 2014-11-08 NOTE — Assessment & Plan Note (Signed)
Chronic  Losartan, Verapamil

## 2014-11-08 NOTE — Progress Notes (Signed)
   Subjective:     HPI    H/o fracture of femur, right, closed - 2/15 - doing well. C/o R knee pain - was severe - about the same. Pt had a LBP - better now   The patient presents for a follow-up of  chronic hypertension, chronic dyslipidemia, type 2 diabetes controlled with medicines, OA.     Wt Readings from Last 3 Encounters:  11/08/14 240 lb (108.863 kg)  07/05/14 240 lb (108.863 kg)  02/22/14 236 lb (107.049 kg)   BP Readings from Last 3 Encounters:  11/08/14 130/78  07/05/14 160/90  02/22/14 142/78       Review of Systems  Constitutional: Negative for activity change, appetite change, fatigue and unexpected weight change.  HENT: Negative for congestion, mouth sores and sinus pressure.   Eyes: Negative for visual disturbance.  Respiratory: Negative for chest tightness.   Gastrointestinal: Negative for nausea and abdominal pain.  Genitourinary: Negative for frequency, difficulty urinating and vaginal pain.  Musculoskeletal: Positive for arthralgias and gait problem. Negative for back pain.  Skin: Negative for pallor.  Neurological: Negative for dizziness, tremors and weakness.  Psychiatric/Behavioral: Negative for confusion and sleep disturbance. The patient is not nervous/anxious.        Objective:   Physical Exam  Constitutional: She appears well-developed. No distress.  obese  HENT:  Head: Normocephalic.  Right Ear: External ear normal.  Left Ear: External ear normal.  Nose: Nose normal.  Mouth/Throat: Oropharynx is clear and moist.  Eyes: Conjunctivae are normal. Pupils are equal, round, and reactive to light. Right eye exhibits no discharge. Left eye exhibits no discharge.  Neck: Normal range of motion. Neck supple. No JVD present. No tracheal deviation present. No thyromegaly present.  Cardiovascular: Normal rate, regular rhythm and normal heart sounds.   Pulmonary/Chest: No stridor. No respiratory distress. She has no wheezes.  Abdominal: Soft.  Bowel sounds are normal. She exhibits no distension and no mass. There is no tenderness. There is no rebound and no guarding.  Musculoskeletal: She exhibits tenderness. She exhibits no edema.  Using a walker  Lymphadenopathy:    She has no cervical adenopathy.  Neurological: She displays normal reflexes. No cranial nerve deficit. She exhibits normal muscle tone. Coordination abnormal.  Skin: No rash noted. No erythema.  Psychiatric: She has a normal mood and affect. Her behavior is normal. Judgment and thought content normal.  R knee is painful Using a walker   Lab Results  Component Value Date   WBC 6.9 11/12/2013   HGB 10.1* 11/12/2013   HCT 30.7* 11/12/2013   PLT 214 11/12/2013   GLUCOSE 133* 11/05/2014   CHOL 198 05/04/2011   TRIG 116.0 05/04/2011   HDL 62.30 05/04/2011   LDLDIRECT 126.1 03/31/2010   LDLCALC 113* 05/04/2011   ALT 15 05/04/2011   AST 18 05/04/2011   NA 141 11/05/2014   K 4.1 11/05/2014   CL 107 11/05/2014   CREATININE 0.80 11/05/2014   BUN 19 11/05/2014   CO2 28 11/05/2014   TSH 2.92 08/01/2012   INR 2.2* 10/25/2007   HGBA1C 5.6 11/05/2014   MICROALBUR 26.2* 06/30/2010       Assessment & Plan:

## 2014-11-08 NOTE — Assessment & Plan Note (Signed)
On Vit D 

## 2014-11-08 NOTE — Progress Notes (Signed)
Pre visit review using our clinic review tool, if applicable. No additional management support is needed unless otherwise documented below in the visit note. 

## 2014-11-25 ENCOUNTER — Encounter: Payer: Self-pay | Admitting: Gastroenterology

## 2014-11-25 ENCOUNTER — Encounter: Payer: Self-pay | Admitting: Internal Medicine

## 2015-02-11 ENCOUNTER — Other Ambulatory Visit (INDEPENDENT_AMBULATORY_CARE_PROVIDER_SITE_OTHER): Payer: Medicare PPO

## 2015-02-11 DIAGNOSIS — I10 Essential (primary) hypertension: Secondary | ICD-10-CM

## 2015-02-11 DIAGNOSIS — E539 Vitamin B deficiency, unspecified: Secondary | ICD-10-CM

## 2015-02-11 DIAGNOSIS — E119 Type 2 diabetes mellitus without complications: Secondary | ICD-10-CM | POA: Diagnosis not present

## 2015-02-11 LAB — HEMOGLOBIN A1C: HEMOGLOBIN A1C: 5.4 % (ref 4.6–6.5)

## 2015-02-11 LAB — BASIC METABOLIC PANEL
BUN: 17 mg/dL (ref 6–23)
CO2: 29 mEq/L (ref 19–32)
Calcium: 9.7 mg/dL (ref 8.4–10.5)
Chloride: 105 mEq/L (ref 96–112)
Creatinine, Ser: 0.76 mg/dL (ref 0.40–1.20)
GFR: 93.12 mL/min (ref >60.00)
Glucose, Bld: 74 mg/dL (ref 70–99)
POTASSIUM: 4.2 meq/L (ref 3.5–5.1)
Sodium: 142 mEq/L (ref 135–145)

## 2015-02-14 ENCOUNTER — Ambulatory Visit (INDEPENDENT_AMBULATORY_CARE_PROVIDER_SITE_OTHER): Payer: Medicare PPO | Admitting: Internal Medicine

## 2015-02-14 ENCOUNTER — Encounter: Payer: Self-pay | Admitting: Internal Medicine

## 2015-02-14 VITALS — BP 130/83 | HR 76 | Wt 240.0 lb

## 2015-02-14 DIAGNOSIS — E1122 Type 2 diabetes mellitus with diabetic chronic kidney disease: Secondary | ICD-10-CM

## 2015-02-14 DIAGNOSIS — Z23 Encounter for immunization: Secondary | ICD-10-CM | POA: Diagnosis not present

## 2015-02-14 DIAGNOSIS — N183 Chronic kidney disease, stage 3 unspecified: Secondary | ICD-10-CM

## 2015-02-14 DIAGNOSIS — I10 Essential (primary) hypertension: Secondary | ICD-10-CM

## 2015-02-14 DIAGNOSIS — E538 Deficiency of other specified B group vitamins: Secondary | ICD-10-CM | POA: Diagnosis not present

## 2015-02-14 MED ORDER — GLIMEPIRIDE 2 MG PO TABS: 2.0000 mg | ORAL_TABLET | Freq: Every day | ORAL | Status: DC

## 2015-02-14 NOTE — Assessment & Plan Note (Signed)
On B12 

## 2015-02-14 NOTE — Patient Instructions (Signed)
Please reduce glimepiride to 1 tab once a day in the morning

## 2015-02-14 NOTE — Progress Notes (Signed)
Pre visit review using our clinic review tool, if applicable. No additional management support is needed unless otherwise documented below in the visit note. 

## 2015-02-14 NOTE — Assessment & Plan Note (Addendum)
On Metformin, Glimepiride  9/16 - hypoglycemia: reduce glimepiride to 1 tab once a day in the morning

## 2015-02-14 NOTE — Assessment & Plan Note (Signed)
Wt Readings from Last 3 Encounters:  02/14/15 240 lb (108.863 kg)  11/08/14 240 lb (108.863 kg)  07/05/14 240 lb (108.863 kg)

## 2015-02-14 NOTE — Progress Notes (Signed)
Subjective:  Patient ID: Holly Banks, female    DOB: Oct 04, 1928  Age: 81 y.o. MRN: [id number]  CC: No chief complaint on file.   HPI Holly Banks presents for OA, DM2, HTN f/u. Pt had low sugar episode at hs once  Outpatient Prescriptions Prior to Visit  Medication Sig Dispense Refill  . allopurinol (ZYLOPRIM) 300 MG tablet Take 1 tablet (300 mg total) by mouth daily. For gout 90 tablet 3  . aspirin 81 MG tablet Take 81 mg by mouth daily.    . Cholecalciferol (EQL VITAMIN D3) 1000 UNITS tablet Take 1,000 Units by mouth daily. For supplement    . HYDROcodone-acetaminophen (NORCO/VICODIN) 5-325 MG per tablet Take 1 tablet by mouth every 6 (six) hours as needed for moderate pain or severe pain. 120 tablet 0  . hydrOXYzine (ATARAX/VISTARIL) 10 MG tablet TAKE 1 TABLET BY MOUTH THREE TIMES DAILY AS NEEDED FOR ITCHING 60 tablet 4  . losartan (COZAAR) 50 MG tablet Take 1 tablet (50 mg total) by mouth daily. For HTN 90 tablet 3  . metFORMIN (GLUCOPHAGE) 500 MG tablet Take 1 tablet (500 mg total) by mouth 2 (two) times daily with a meal. For diabetes 180 tablet 3  . potassium chloride (KLOR-CON 10) 10 MEQ tablet Take 1 tablet (10 mEq total) by mouth daily. 90 tablet 3  . verapamil (VERELAN PM) 360 MG 24 hr capsule Take 1 capsule (360 mg total) by mouth at bedtime. For HTN 90 capsule 3  . vitamin B-12 (CYANOCOBALAMIN) 1000 MCG tablet Take 1,000 mcg by mouth daily.      [name] glimepiride (AMARYL) 2 MG tablet Take 1 tablet (2 mg total) by mouth 2 (two) times daily. 180 tablet 3   No facility-administered medications prior to visit.    ROS Review of Systems  Constitutional: Negative for chills, activity change, appetite change, fatigue and unexpected weight change.  HENT: Negative for congestion, mouth sores and sinus pressure.   Eyes: Negative for visual disturbance.  Respiratory: Negative for cough and chest tightness.   Gastrointestinal: Negative for nausea and abdominal pain.    Genitourinary: Negative for frequency, difficulty urinating and vaginal pain.  Musculoskeletal: Positive for back pain, arthralgias and gait problem.  Skin: Negative for pallor and rash.  Neurological: Negative for dizziness, tremors, weakness, numbness and headaches.  Psychiatric/Behavioral: Negative for confusion and sleep disturbance.    Objective:  BP 130/83 mmHg  Pulse 76  Wt 240 lb (108.863 kg)  SpO2 98%  BP Readings from Last 3 Encounters:  02/14/15 130/83  11/08/14 130/78  07/05/14 160/90    Wt Readings from Last 3 Encounters:  02/14/15 240 lb (108.863 kg)  11/08/14 240 lb (108.863 kg)  07/05/14 240 lb (108.863 kg)    Physical Exam  Constitutional: She appears well-developed. No distress.  HENT:  Head: Normocephalic.  Right Ear: External ear normal.  Left Ear: External ear normal.  Nose: Nose normal.  Mouth/Throat: Oropharynx is clear and moist.  Eyes: Conjunctivae are normal. Pupils are equal, round, and reactive to light. Right eye exhibits no discharge. Left eye exhibits no discharge.  Neck: Normal range of motion. Neck supple. No JVD present. No tracheal deviation present. No thyromegaly present.  Cardiovascular: Normal rate, regular rhythm and normal heart sounds.   Pulmonary/Chest: No stridor. No respiratory distress. She has no wheezes.  Abdominal: Soft. Bowel sounds are normal. She exhibits no distension and no mass. There is no tenderness. There is no rebound and no guarding.  Musculoskeletal: She exhibits tenderness.  She exhibits no edema.  Lymphadenopathy:    She has no cervical adenopathy.  Neurological: She displays normal reflexes. No cranial nerve deficit. She exhibits normal muscle tone. Coordination abnormal.  Skin: No rash noted. No erythema.  Psychiatric: She has a normal mood and affect. Her behavior is normal. Judgment and thought content normal.  Obese Using a walker  Lab Results  Component Value Date   WBC 6.9 11/12/2013   HGB 10.1*  11/12/2013   HCT 30.7* 11/12/2013   PLT 214 11/12/2013   GLUCOSE 74 02/11/2015   CHOL 198 05/04/2011   TRIG 116.0 05/04/2011   HDL 62.30 05/04/2011   LDLDIRECT 126.1 03/31/2010   LDLCALC 113* 05/04/2011   ALT 15 05/04/2011   AST 18 05/04/2011   NA 142 02/11/2015   K 4.2 02/11/2015   CL 105 02/11/2015   CREATININE 0.76 02/11/2015   BUN 17 02/11/2015   CO2 29 02/11/2015   TSH 2.92 08/01/2012   INR 2.2* 10/25/2007   HGBA1C 5.4 02/11/2015   MICROALBUR 26.2* 06/30/2010    Dg Bone Density  01/06/2014   BMD is normal @ all sites evaluated ; recheck in 3-5 years is appropriate   based on studies in the medical  literature ( JAMA .2130;[id number])   Assessment & Plan:   Diagnoses and all orders for this visit:  Need for influenza vaccination -     Flu Vaccine QUAD 36+ mos IM  Essential hypertension  Vitamin B 12 deficiency  DM type 2 causing CKD stage 3  OBESITY, MORBID  Other orders -     glimepiride (AMARYL) 2 MG tablet; Take 1 tablet (2 mg total) by mouth daily with breakfast.   I have changed Holly Banks's glimepiride. I am also having her maintain her vitamin B-12, Cholecalciferol, aspirin, allopurinol, losartan, metFORMIN, potassium chloride, verapamil, hydrOXYzine, and HYDROcodone-acetaminophen.  Meds ordered this encounter  Medications  . glimepiride (AMARYL) 2 MG tablet    Sig: Take 1 tablet (2 mg total) by mouth daily with breakfast.    Dispense:  90 tablet    Refill:  3     Follow-up: Return in about 3 months (around 05/16/2015) for a follow-up visit.  [name], MD

## 2015-02-14 NOTE — Assessment & Plan Note (Signed)
Losartan, Verapamil

## 2015-03-04 ENCOUNTER — Encounter: Payer: Self-pay | Admitting: Internal Medicine

## 2015-03-22 IMAGING — CR DG HIP COMPLETE 2+V*R*
4 series · 4 of 4 positions shown · non-contrast
Comparison: Right knee radiographs 07/11/2013

CLINICAL DATA: Trauma, fall

EXAM:
RIGHT HIP - COMPLETE 2+ VIEW

[t pelvis ap]
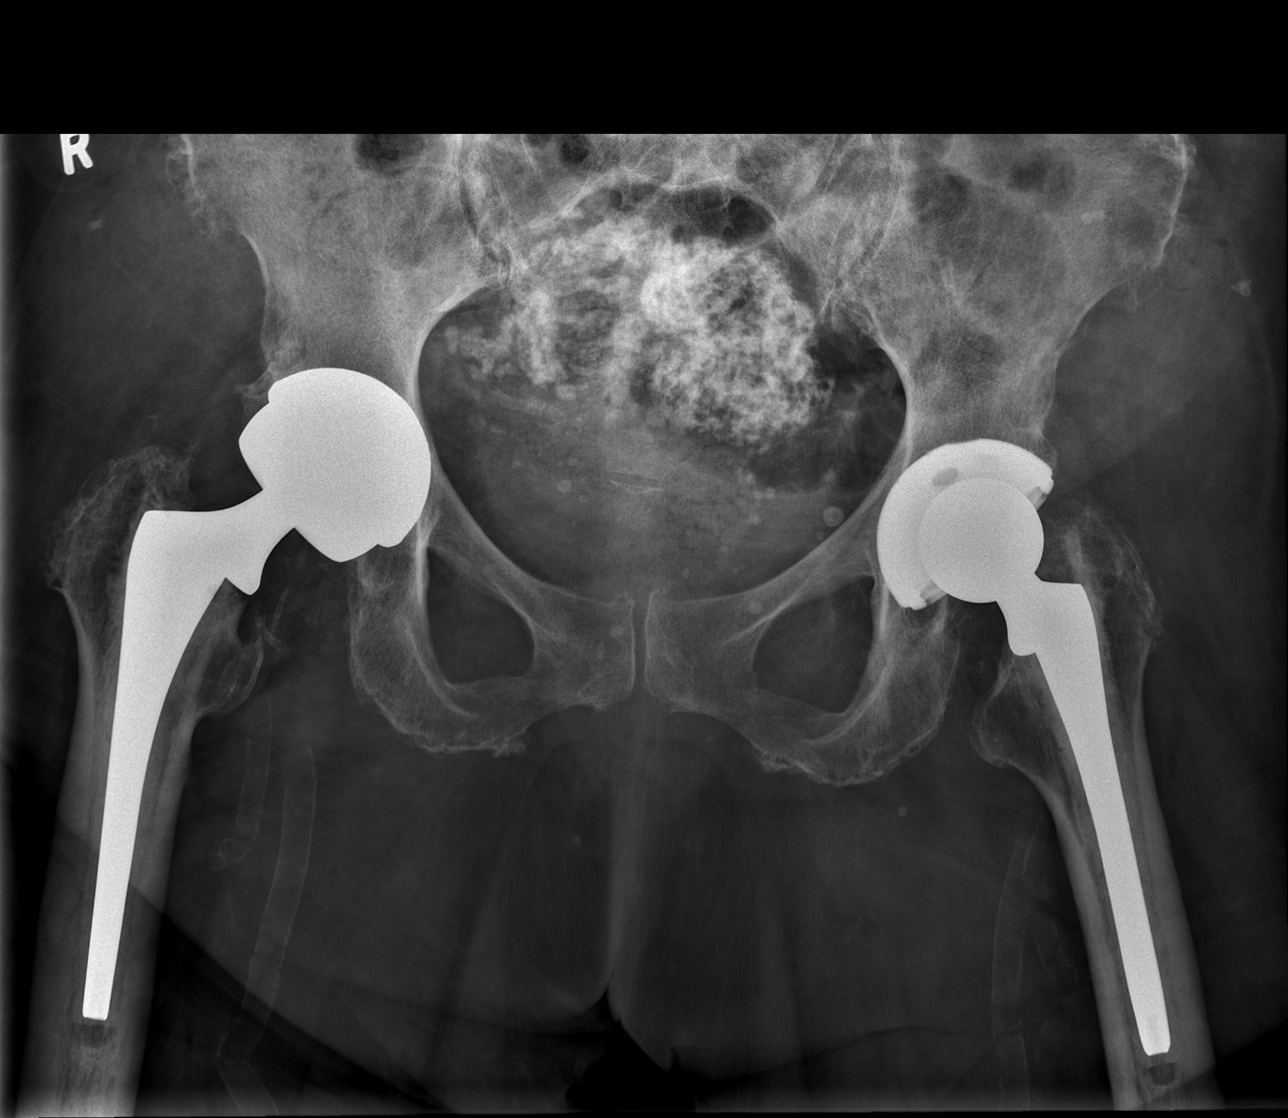

[t hip ap right]
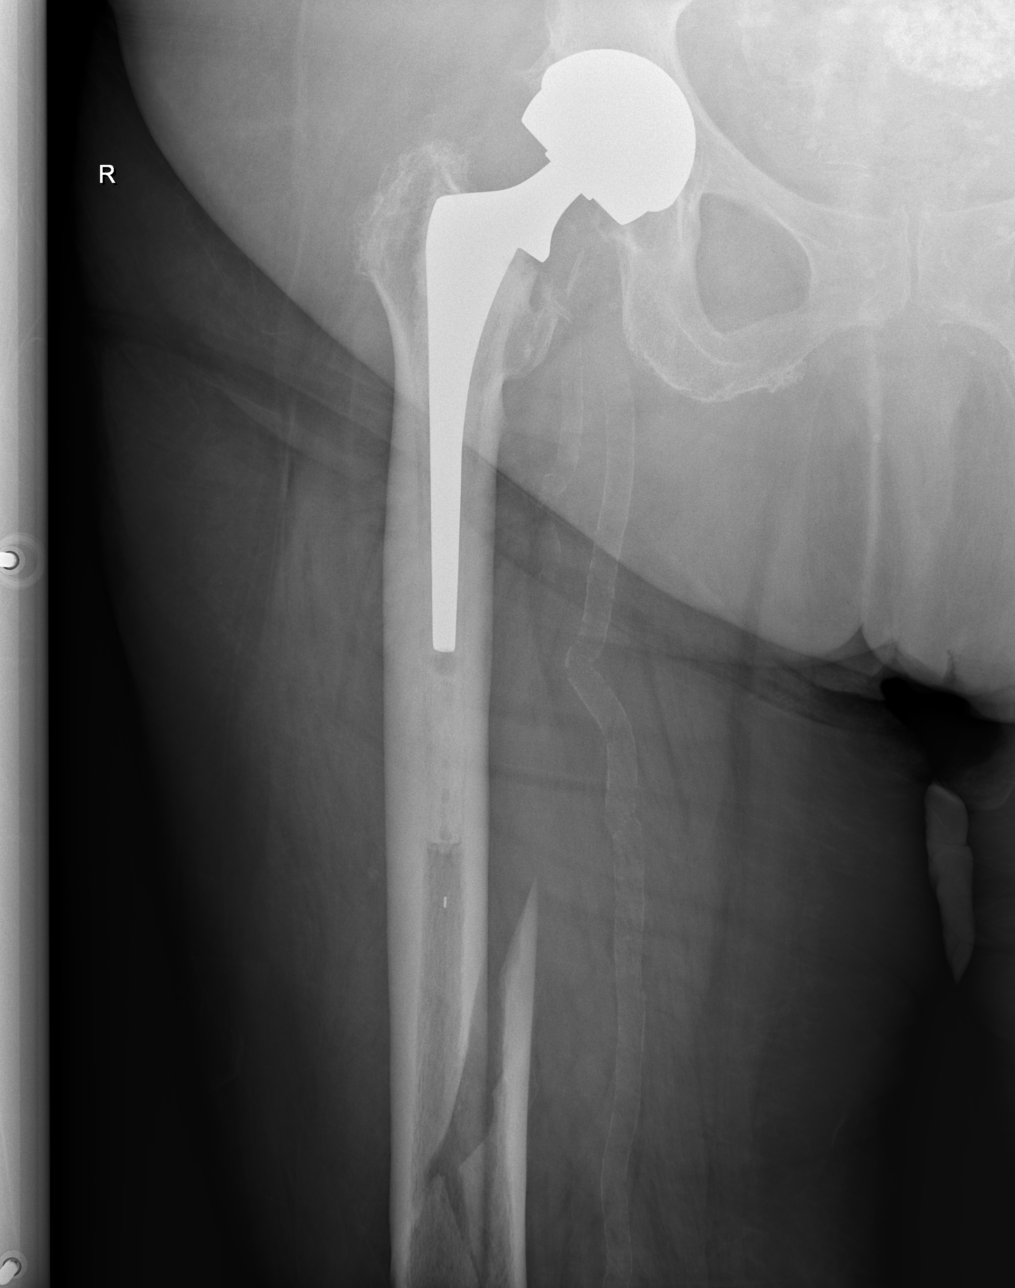

[w hip lat right (1 of 2)]
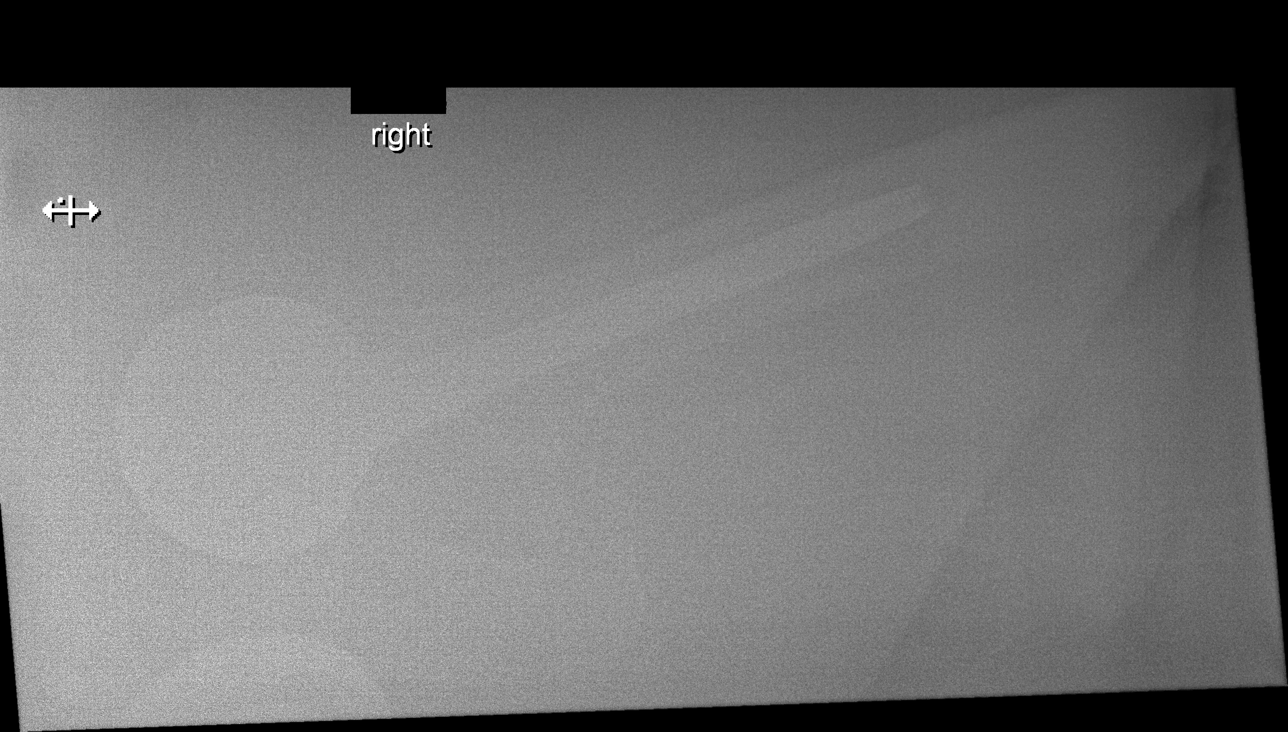

[w hip lat right (2 of 2)]
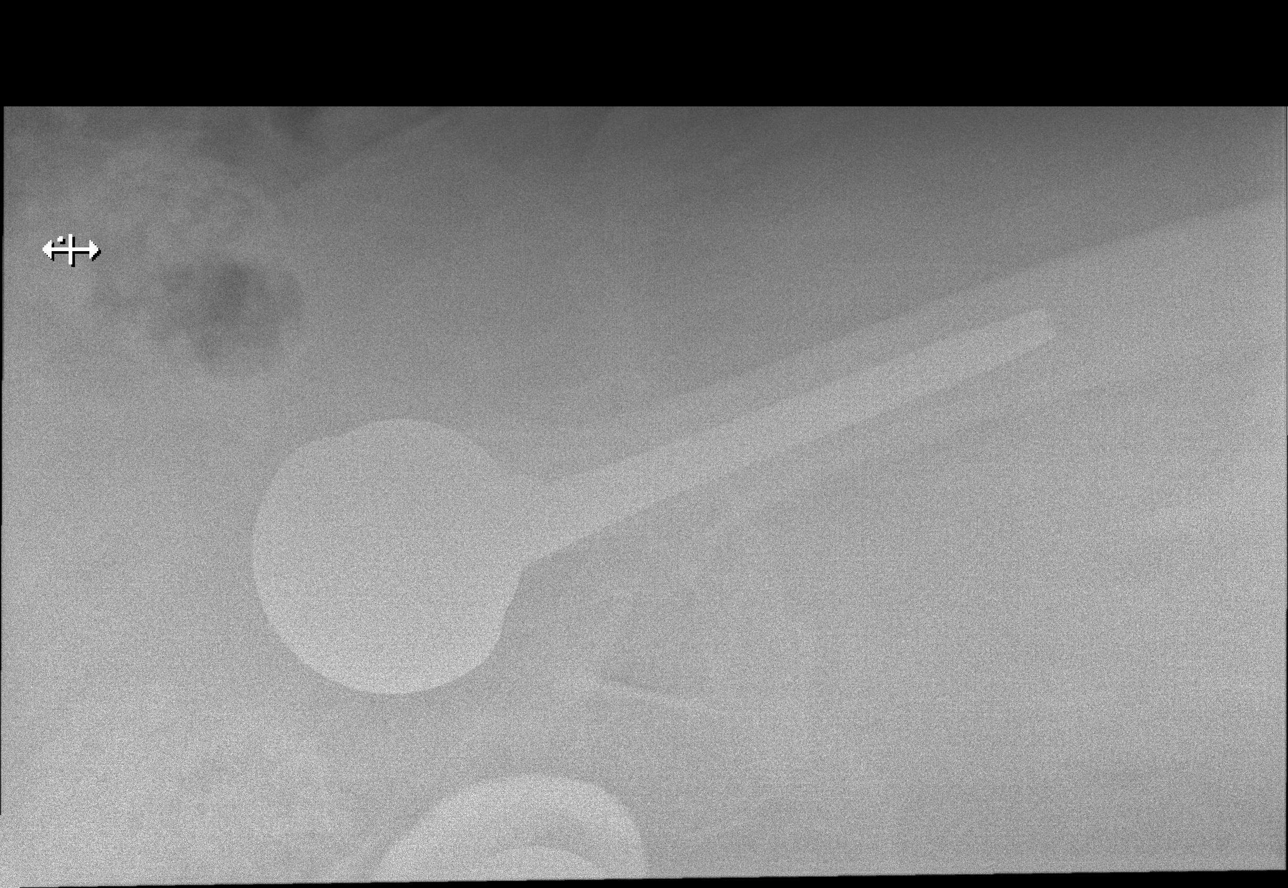

[4 of 4 positions shown; findings below may reference images not displayed]

FINDINGS: Bilateral prostheses.

Bones demineralized.

Large calcified uterine leiomyoma in pelvis, [DATE] x 6.1 cm.

Extensive atherosclerotic calcification in numerous pelvic
phleboliths.

Comminuted oblique distal right femoral meta diaphyseal fracture
identified at the margin of the exam, more occluding delineated and
reported on knee radiographs.

Proximal right femur appears intact.
IMPRESSION: Comminuted displaced distal right femoral metaphyseal fracture
better visualized and reported on right knee radiograph exam.

Bilateral hip prostheses.

Diffuse osseous demineralization.

No acute proximal right femoral abnormality.

## 2015-03-22 IMAGING — CR DG KNEE COMPLETE 4+V*R*
1 series · 1 of 1 positions shown · non-contrast
Comparison: None

CLINICAL DATA: Trauma, fall

EXAM:
RIGHT KNEE - COMPLETE 4+ VIEW

[w hip lat right]
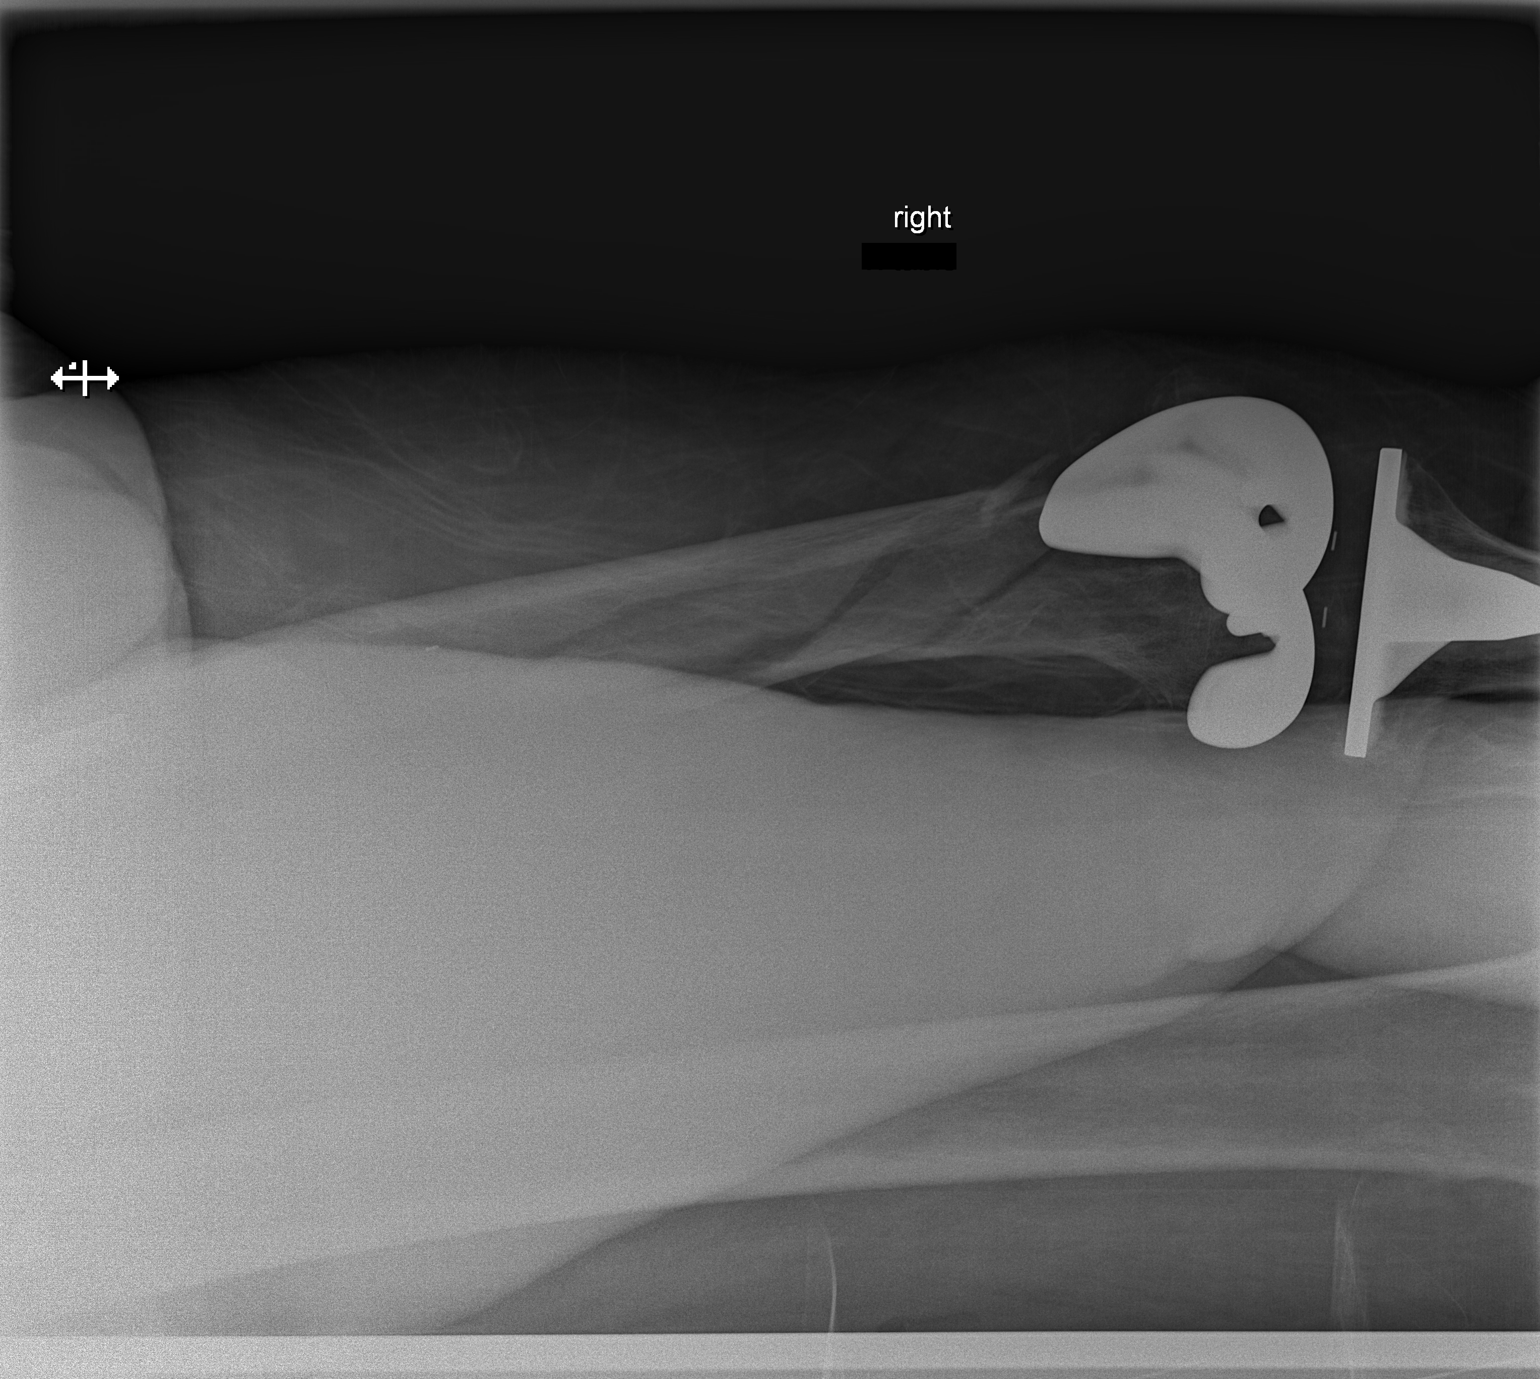

[1 of 1 positions shown; findings below may reference images not displayed]

FINDINGS: Components of a right knee prosthesis are identified in expected
positions.

Bones appear diffusely demineralized.

Oblique comminuted fracture distal right femoral metadiaphysis,
displaced posteriorly and slightly laterally with overriding.

Fracture planes extend to near but not definitely to the femoral
component of the right knee prosthesis.

No additional fracture or dislocation.

Extensive atherosclerotic calcification.
IMPRESSION: Comminuted oblique displaced distal right femoral metadiaphyseal
fracture.

Osseous demineralization with note of components of a right knee
prosthesis.

## 2015-05-21 ENCOUNTER — Other Ambulatory Visit (INDEPENDENT_AMBULATORY_CARE_PROVIDER_SITE_OTHER): Payer: Medicare PPO

## 2015-05-21 ENCOUNTER — Ambulatory Visit (INDEPENDENT_AMBULATORY_CARE_PROVIDER_SITE_OTHER): Payer: Medicare PPO | Admitting: Internal Medicine

## 2015-05-21 ENCOUNTER — Encounter: Payer: Self-pay | Admitting: Internal Medicine

## 2015-05-21 VITALS — BP 138/86 | HR 73 | Wt 234.0 lb

## 2015-05-21 DIAGNOSIS — M150 Primary generalized (osteo)arthritis: Secondary | ICD-10-CM

## 2015-05-21 DIAGNOSIS — N183 Chronic kidney disease, stage 3 unspecified: Secondary | ICD-10-CM

## 2015-05-21 DIAGNOSIS — E538 Deficiency of other specified B group vitamins: Secondary | ICD-10-CM

## 2015-05-21 DIAGNOSIS — I10 Essential (primary) hypertension: Secondary | ICD-10-CM | POA: Diagnosis not present

## 2015-05-21 DIAGNOSIS — E1122 Type 2 diabetes mellitus with diabetic chronic kidney disease: Secondary | ICD-10-CM

## 2015-05-21 DIAGNOSIS — M159 Polyosteoarthritis, unspecified: Secondary | ICD-10-CM

## 2015-05-21 LAB — BASIC METABOLIC PANEL
BUN: 24 mg/dL — AB (ref 6–23)
CO2: 29 meq/L (ref 19–32)
CREATININE: 0.88 mg/dL (ref 0.40–1.20)
Calcium: 9.9 mg/dL (ref 8.4–10.5)
Chloride: 104 mEq/L (ref 96–112)
GFR: 78.58 mL/min (ref >60.00)
GLUCOSE: 145 mg/dL — AB (ref 70–99)
POTASSIUM: 4.2 meq/L (ref 3.5–5.1)
Sodium: 142 mEq/L (ref 135–145)

## 2015-05-21 LAB — HEMOGLOBIN A1C: Hgb A1c MFr Bld: 5.8 % (ref 4.6–6.5)

## 2015-05-21 MED ORDER — HYDROCODONE-ACETAMINOPHEN 5-325 MG PO TABS
1.0000 | ORAL_TABLET | Freq: Four times a day (QID) | ORAL | Status: DC | PRN
Start: 2015-05-21 — End: 2015-09-12

## 2015-05-21 NOTE — Assessment & Plan Note (Signed)
B knees Chronic, severe. Norco prn  Potential benefits of a long term opioids use as well as potential risks (i.e. addiction risk, apnea etc) and complications (i.e. Somnolence, constipation and others) were explained to the patient and were aknowledged.

## 2015-05-21 NOTE — Progress Notes (Signed)
Subjective:  Patient ID: Holly Banks, female    DOB: 03/13/1929  Age: 81 y.o. MRN: [id number]  CC: No chief complaint on file.   HPI Holly Banks presents for OA, DM, HTN f/u  Outpatient Prescriptions Prior to Visit  Medication Sig Dispense Refill  . allopurinol (ZYLOPRIM) 300 MG tablet Take 1 tablet (300 mg total) by mouth daily. For gout 90 tablet 3  . aspirin 81 MG tablet Take 81 mg by mouth daily.    . Cholecalciferol (EQL VITAMIN D3) 1000 UNITS tablet Take 1,000 Units by mouth daily. For supplement    . glimepiride (AMARYL) 2 MG tablet Take 1 tablet (2 mg total) by mouth daily with breakfast. 90 tablet 3  . hydrOXYzine (ATARAX/VISTARIL) 10 MG tablet TAKE 1 TABLET BY MOUTH THREE TIMES DAILY AS NEEDED FOR ITCHING 60 tablet 4  . losartan (COZAAR) 50 MG tablet Take 1 tablet (50 mg total) by mouth daily. For HTN 90 tablet 3  . metFORMIN (GLUCOPHAGE) 500 MG tablet Take 1 tablet (500 mg total) by mouth 2 (two) times daily with a meal. For diabetes 180 tablet 3  . potassium chloride (KLOR-CON 10) 10 MEQ tablet Take 1 tablet (10 mEq total) by mouth daily. 90 tablet 3  . verapamil (VERELAN PM) 360 MG 24 hr capsule Take 1 capsule (360 mg total) by mouth at bedtime. For HTN 90 capsule 3  . vitamin B-12 (CYANOCOBALAMIN) 1000 MCG tablet Take 1,000 mcg by mouth daily.      [name] HYDROcodone-acetaminophen (NORCO/VICODIN) 5-325 MG per tablet Take 1 tablet by mouth every 6 (six) hours as needed for moderate pain or severe pain. 120 tablet 0   No facility-administered medications prior to visit.    ROS Review of Systems  Constitutional: Positive for fatigue and unexpected weight change. Negative for chills, activity change and appetite change.  HENT: Negative for congestion, mouth sores and sinus pressure.   Eyes: Negative for visual disturbance.  Respiratory: Negative for cough and chest tightness.   Gastrointestinal: Negative for nausea and abdominal pain.  Genitourinary: Negative for  frequency, difficulty urinating and vaginal pain.  Musculoskeletal: Positive for arthralgias and gait problem. Negative for back pain.  Skin: Negative for pallor and rash.  Neurological: Negative for dizziness, tremors, weakness, numbness and headaches.  Psychiatric/Behavioral: Negative for suicidal ideas, confusion and sleep disturbance. The patient is not nervous/anxious.     Objective:  BP 138/86 mmHg  Pulse 73  Wt 234 lb (106.142 kg)  SpO2 98%  BP Readings from Last 3 Encounters:  05/21/15 138/86  02/14/15 130/83  11/08/14 130/78    Wt Readings from Last 3 Encounters:  05/21/15 234 lb (106.142 kg)  02/14/15 240 lb (108.863 kg)  11/08/14 240 lb (108.863 kg)    Physical Exam  Constitutional: She appears well-developed. No distress.  HENT:  Head: Normocephalic.  Right Ear: External ear normal.  Left Ear: External ear normal.  Nose: Nose normal.  Mouth/Throat: Oropharynx is clear and moist.  Eyes: Conjunctivae are normal. Pupils are equal, round, and reactive to light. Right eye exhibits no discharge. Left eye exhibits no discharge.  Neck: Normal range of motion. Neck supple. No JVD present. No tracheal deviation present. No thyromegaly present.  Cardiovascular: Normal rate, regular rhythm and normal heart sounds.   Pulmonary/Chest: No stridor. No respiratory distress. She has no wheezes.  Abdominal: Soft. Bowel sounds are normal. She exhibits no distension and no mass. There is no tenderness. There is no rebound and no guarding.  Musculoskeletal:  She exhibits tenderness. She exhibits no edema.  Lymphadenopathy:    She has no cervical adenopathy.  Neurological: She displays normal reflexes. No cranial nerve deficit. She exhibits normal muscle tone. Coordination abnormal.  Skin: No rash noted. No erythema.  Psychiatric: She has a normal mood and affect. Her behavior is normal. Judgment and thought content normal.  Obese Holly   Lab Results  Component Value Date   WBC  6.9 11/12/2013   HGB 10.1* 11/12/2013   HCT 30.7* 11/12/2013   PLT 214 11/12/2013   GLUCOSE 74 02/11/2015   CHOL 198 05/04/2011   TRIG 116.0 05/04/2011   HDL 62.30 05/04/2011   LDLDIRECT 126.1 03/31/2010   LDLCALC 113* 05/04/2011   ALT 15 05/04/2011   AST 18 05/04/2011   NA 142 02/11/2015   K 4.2 02/11/2015   CL 105 02/11/2015   CREATININE 0.76 02/11/2015   BUN 17 02/11/2015   CO2 29 02/11/2015   TSH 2.92 08/01/2012   INR 2.2* 10/25/2007   HGBA1C 5.4 02/11/2015   MICROALBUR 26.2* 06/30/2010    Dg Bone Density  01/06/2014  BMD is normal @ all sites evaluated ; recheck in 3-5 years is appropriate  based on studies in the medical  literature ( JAMA .[id number])   Assessment & Plan:   Diagnoses and all orders for this visit:  Type 2 diabetes mellitus with stage 3 chronic kidney disease, without long-term current use of insulin (HCC) -     Basic metabolic panel; Standing -     Hemoglobin A1c; Standing  Essential hypertension  Vitamin B 12 deficiency  Other orders -     HYDROcodone-acetaminophen (NORCO/VICODIN) 5-325 MG tablet; Take 1 tablet by mouth every 6 (six) hours as needed for moderate pain or severe pain.  I have changed Holly Banks's HYDROcodone-acetaminophen. I am also having her maintain her vitamin B-12, Cholecalciferol, aspirin, allopurinol, losartan, metFORMIN, potassium chloride, verapamil, hydrOXYzine, and glimepiride.  Meds ordered this encounter  Medications  . HYDROcodone-acetaminophen (NORCO/VICODIN) 5-325 MG tablet    Sig: Take 1 tablet by mouth every 6 (six) hours as needed for moderate pain or severe pain.    Dispense:  120 tablet    Refill:  0     Follow-up: Return in about 3 months (around 08/19/2015) for a follow-up visit.  Holly Kehr, MD

## 2015-05-21 NOTE — Assessment & Plan Note (Signed)
Better on diet 

## 2015-05-21 NOTE — Assessment & Plan Note (Signed)
Chronic 6/15, 9/16 - hypoglycemia On Metformin, Glimepiride  - no low sugars

## 2015-05-21 NOTE — Assessment & Plan Note (Signed)
Losartan, Verapamil Risks associated with treatment noncompliance were discussed. Compliance was encouraged.

## 2015-05-21 NOTE — Progress Notes (Signed)
Pre visit review using our clinic review tool, if applicable. No additional management support is needed unless otherwise documented below in the visit note. 

## 2015-05-21 NOTE — Assessment & Plan Note (Signed)
On B12 

## 2015-06-18 DIAGNOSIS — Z7689 Persons encountering health services in other specified circumstances: Secondary | ICD-10-CM

## 2015-08-18 ENCOUNTER — Other Ambulatory Visit (INDEPENDENT_AMBULATORY_CARE_PROVIDER_SITE_OTHER): Payer: Medicare PPO

## 2015-08-18 DIAGNOSIS — N183 Chronic kidney disease, stage 3 unspecified: Secondary | ICD-10-CM

## 2015-08-18 DIAGNOSIS — E1122 Type 2 diabetes mellitus with diabetic chronic kidney disease: Secondary | ICD-10-CM

## 2015-08-18 LAB — BASIC METABOLIC PANEL
BUN: 20 mg/dL (ref 6–23)
CHLORIDE: 103 meq/L (ref 96–112)
CO2: 29 meq/L (ref 19–32)
Calcium: 10 mg/dL (ref 8.4–10.5)
Creatinine, Ser: 0.8 mg/dL (ref 0.40–1.20)
GFR: 87.66 mL/min (ref >60.00)
GLUCOSE: 146 mg/dL — AB (ref 70–99)
POTASSIUM: 4.2 meq/L (ref 3.5–5.1)
Sodium: 142 mEq/L (ref 135–145)

## 2015-08-18 LAB — HEMOGLOBIN A1C: Hgb A1c MFr Bld: 5.8 % (ref 4.6–6.5)

## 2015-08-22 ENCOUNTER — Encounter: Payer: Self-pay | Admitting: Internal Medicine

## 2015-08-22 ENCOUNTER — Ambulatory Visit (INDEPENDENT_AMBULATORY_CARE_PROVIDER_SITE_OTHER): Payer: Medicare PPO | Admitting: Internal Medicine

## 2015-08-22 VITALS — BP 130/70 | HR 86 | Wt 237.0 lb

## 2015-08-22 DIAGNOSIS — M67449 Ganglion, unspecified hand: Secondary | ICD-10-CM | POA: Insufficient documentation

## 2015-08-22 DIAGNOSIS — E1122 Type 2 diabetes mellitus with diabetic chronic kidney disease: Secondary | ICD-10-CM | POA: Diagnosis not present

## 2015-08-22 DIAGNOSIS — I10 Essential (primary) hypertension: Secondary | ICD-10-CM | POA: Diagnosis not present

## 2015-08-22 DIAGNOSIS — N183 Chronic kidney disease, stage 3 unspecified: Secondary | ICD-10-CM

## 2015-08-22 DIAGNOSIS — E538 Deficiency of other specified B group vitamins: Secondary | ICD-10-CM

## 2015-08-22 DIAGNOSIS — M67442 Ganglion, left hand: Secondary | ICD-10-CM

## 2015-08-22 NOTE — Assessment & Plan Note (Signed)
On Metformin, Glimepiride  

## 2015-08-22 NOTE — Progress Notes (Signed)
Subjective:  Patient ID: Holly Banks, female    DOB: 11/02/1928  Age: 82 y.o. MRN: [id number]  CC: No chief complaint on file.   HPI Holly Banks presents for DM2, HTN, OA f/u. C/o L 5th digit swelling (NT) x 2 months. No low CBGs  Outpatient Prescriptions Prior to Visit  Medication Sig Dispense Refill  . allopurinol (ZYLOPRIM) 300 MG tablet Take 1 tablet (300 mg total) by mouth daily. For gout 90 tablet 3  . aspirin 81 MG tablet Take 81 mg by mouth daily.    . Cholecalciferol (EQL VITAMIN D3) 1000 UNITS tablet Take 1,000 Units by mouth daily. For supplement    . glimepiride (AMARYL) 2 MG tablet Take 1 tablet (2 mg total) by mouth daily with breakfast. 90 tablet 3  . HYDROcodone-acetaminophen (NORCO/VICODIN) 5-325 MG tablet Take 1 tablet by mouth every 6 (six) hours as needed for moderate pain or severe pain. 120 tablet 0  . hydrOXYzine (ATARAX/VISTARIL) 10 MG tablet TAKE 1 TABLET BY MOUTH THREE TIMES DAILY AS NEEDED FOR ITCHING 60 tablet 4  . metFORMIN (GLUCOPHAGE) 500 MG tablet Take 1 tablet (500 mg total) by mouth 2 (two) times daily with a meal. For diabetes 180 tablet 3  . potassium chloride (KLOR-CON 10) 10 MEQ tablet Take 1 tablet (10 mEq total) by mouth daily. 90 tablet 3  . verapamil (VERELAN PM) 360 MG 24 hr capsule Take 1 capsule (360 mg total) by mouth at bedtime. For HTN 90 capsule 3  . vitamin B-12 (CYANOCOBALAMIN) 1000 MCG tablet Take 1,000 mcg by mouth daily.      [name] losartan (COZAAR) 50 MG tablet Take 1 tablet (50 mg total) by mouth daily. For HTN 90 tablet 3   No facility-administered medications prior to visit.    ROS Review of Systems  Constitutional: Negative for chills, activity change, appetite change, fatigue and unexpected weight change.  HENT: Negative for congestion, mouth sores and sinus pressure.   Eyes: Negative for visual disturbance.  Respiratory: Negative for cough and chest tightness.   Gastrointestinal: Negative for nausea and abdominal  pain.  Genitourinary: Negative for frequency, difficulty urinating and vaginal pain.  Musculoskeletal: Positive for back pain, arthralgias and gait problem.  Skin: Negative for pallor and rash.  Neurological: Negative for dizziness, tremors, weakness, numbness and headaches.  Psychiatric/Behavioral: Negative for confusion and sleep disturbance.   L 5th digit swelling (NT)  Objective:  BP 130/70 mmHg  Pulse 86  Wt 237 lb (107.502 kg)  SpO2 96%  BP Readings from Last 3 Encounters:  08/22/15 130/70  05/21/15 138/86  02/14/15 130/83    Wt Readings from Last 3 Encounters:  08/22/15 237 lb (107.502 kg)  05/21/15 234 lb (106.142 kg)  02/14/15 240 lb (108.863 kg)    Physical Exam  Constitutional: She appears well-developed. No distress.  HENT:  Head: Normocephalic.  Right Ear: External ear normal.  Left Ear: External ear normal.  Nose: Nose normal.  Mouth/Throat: Oropharynx is clear and moist.  Eyes: Conjunctivae are normal. Pupils are equal, round, and reactive to light. Right eye exhibits no discharge. Left eye exhibits no discharge.  Neck: Normal range of motion. Neck supple. No JVD present. No tracheal deviation present. No thyromegaly present.  Cardiovascular: Normal rate, regular rhythm and normal heart sounds.   Pulmonary/Chest: No stridor. No respiratory distress. She has no wheezes.  Abdominal: Soft. Bowel sounds are normal. She exhibits no distension and no mass. There is no tenderness. There is no rebound and no  guarding.  Musculoskeletal: She exhibits tenderness. She exhibits no edema.  Lymphadenopathy:    She has no cervical adenopathy.  Neurological: She displays normal reflexes. No cranial nerve deficit. She exhibits normal muscle tone. Coordination abnormal.  Skin: No rash noted. No erythema.  Psychiatric: She has a normal mood and affect. Her behavior is normal. Judgment and thought content normal.  Obese, using a walker L 5th digit swelling (NT) 1.0x1.5 cm  mid-phalanx, palmar  Lab Results  Component Value Date   WBC 6.9 11/12/2013   HGB 10.1* 11/12/2013   HCT 30.7* 11/12/2013   PLT 214 11/12/2013   GLUCOSE 146* 08/18/2015   CHOL 198 05/04/2011   TRIG 116.0 05/04/2011   HDL 62.30 05/04/2011   LDLDIRECT 126.1 03/31/2010   LDLCALC 113* 05/04/2011   ALT 15 05/04/2011   AST 18 05/04/2011   NA 142 08/18/2015   K 4.2 08/18/2015   CL 103 08/18/2015   CREATININE 0.80 08/18/2015   BUN 20 08/18/2015   CO2 29 08/18/2015   TSH 2.92 08/01/2012   INR 2.2* 10/25/2007   HGBA1C 5.8 08/18/2015   MICROALBUR 26.2* 06/30/2010    Dg Bone Density  01/06/2014  BMD is normal @ all sites evaluated ; recheck in 3-5 years is appropriate  based on studies in the medical  literature ( JAMA .2013;310:1256-1262)   Procedure:  Cyst aspiration  Indication:  Growing cyst  Risks including bleeding, infection, relapse and others as well as benefits were explained to the patient in detail. Pt was placed in the sitting position. Skin was prepped with Betadine and alcohol and injected with 1/2 cc of 2% lidocaine. 22 gauge needle was introduced in the cyst and 1.5 cc of clear gel-like material was aspirated/expressed and discarded. The cavity was injected with 10 mg of Depo-medrol. Bandaid.   Tolerated well. Complications none. Instructions provided.    Assessment & Plan:   Diagnoses and all orders for this visit:  Ganglion cyst of flexor tendon sheath of finger, left  Type 2 diabetes mellitus with stage 3 chronic kidney disease, without long-term current use of insulin (HCC)  Vitamin B 12 deficiency  Essential hypertension   I am having Holly Banks maintain her vitamin B-12, Cholecalciferol, aspirin, allopurinol, losartan, metFORMIN, potassium chloride, verapamil, hydrOXYzine, glimepiride, and HYDROcodone-acetaminophen.  No orders of the defined types were placed in this encounter.     Follow-up: Return in about 3 months (around 11/22/2015) for a  follow-up visit.  [name], MD

## 2015-08-22 NOTE — Progress Notes (Signed)
Pre visit review using our clinic review tool, if applicable. No additional management support is needed unless otherwise documented below in the visit note. 

## 2015-08-22 NOTE — Assessment & Plan Note (Signed)
Losartan, Verapamil

## 2015-08-22 NOTE — Assessment & Plan Note (Signed)
3/17 L 5th digit  See procedure

## 2015-08-22 NOTE — Assessment & Plan Note (Signed)
On B12 

## 2015-08-23 ENCOUNTER — Encounter: Payer: Self-pay | Admitting: Internal Medicine

## 2015-08-23 MED ORDER — METHYLPREDNISOLONE ACETATE 40 MG/ML IJ SUSP
10.0000 mg | Freq: Once | INTRAMUSCULAR | Status: AC
  Administered 2015-08-27: 10 mg via INTRA_ARTICULAR

## 2015-08-27 DIAGNOSIS — M67442 Ganglion, left hand: Secondary | ICD-10-CM | POA: Diagnosis not present

## 2015-09-08 ENCOUNTER — Telehealth: Payer: Self-pay | Admitting: Internal Medicine

## 2015-09-08 NOTE — Telephone Encounter (Signed)
Pt was just in a couple weeks ago and she said she had plenty of her medication.  She was mistaken and she is requesting a refill for glimepiride (AMARYL) 2 MG tablet [id number] losartan (COZAAR) 50 MG tablet [id number] metFORMIN (GLUCOPHAGE) 500 MG tablet [id number]  potassium chloride (KLOR-CON 10) 10 MEQ tablet [id number]  verapamil (VERELAN PM) 360 MG 24 hr capsule [id number]   [facility] mail order

## 2015-09-09 MED ORDER — METFORMIN HCL 500 MG PO TABS: 500.0000 mg | ORAL_TABLET | Freq: Two times a day (BID) | ORAL | Status: DC

## 2015-09-09 MED ORDER — LOSARTAN POTASSIUM 50 MG PO TABS: 50.0000 mg | ORAL_TABLET | Freq: Every day | ORAL | Status: DC

## 2015-09-09 MED ORDER — GLIMEPIRIDE 2 MG PO TABS: 2.0000 mg | ORAL_TABLET | Freq: Every day | ORAL | Status: DC

## 2015-09-09 MED ORDER — VERAPAMIL HCL ER 360 MG PO CP24: 360.0000 mg | ORAL_CAPSULE | Freq: Every day | ORAL | Status: DC

## 2015-09-09 MED ORDER — POTASSIUM CHLORIDE ER 10 MEQ PO TBCR: 10.0000 meq | EXTENDED_RELEASE_TABLET | Freq: Every day | ORAL | Status: DC

## 2015-09-09 NOTE — Telephone Encounter (Signed)
Done. See meds.  

## 2015-09-11 ENCOUNTER — Telehealth: Payer: Self-pay | Admitting: *Deleted

## 2015-09-11 NOTE — Telephone Encounter (Signed)
OK to fill this prescription with additional refills x0 Thank you!  

## 2015-09-11 NOTE — Telephone Encounter (Signed)
Requesting refill on her pain med "Hydrocodone".../lmb 

## 2015-09-12 MED ORDER — HYDROCODONE-ACETAMINOPHEN 5-325 MG PO TABS: 1.0000 | ORAL_TABLET | Freq: Four times a day (QID) | ORAL | Status: DC | PRN

## 2015-09-12 NOTE — Telephone Encounter (Signed)
Notified pt rx ready for pick-up.../lmb 

## 2015-11-25 ENCOUNTER — Ambulatory Visit: Payer: Medicare PPO | Admitting: Internal Medicine

## 2015-11-28 ENCOUNTER — Encounter: Payer: Self-pay | Admitting: Internal Medicine

## 2015-11-28 ENCOUNTER — Other Ambulatory Visit (INDEPENDENT_AMBULATORY_CARE_PROVIDER_SITE_OTHER): Payer: Medicare PPO

## 2015-11-28 ENCOUNTER — Ambulatory Visit (INDEPENDENT_AMBULATORY_CARE_PROVIDER_SITE_OTHER): Payer: Medicare PPO | Admitting: Internal Medicine

## 2015-11-28 VITALS — BP 130/66 | HR 88 | Wt 229.0 lb

## 2015-11-28 DIAGNOSIS — M79602 Pain in left arm: Secondary | ICD-10-CM

## 2015-11-28 DIAGNOSIS — E538 Deficiency of other specified B group vitamins: Secondary | ICD-10-CM

## 2015-11-28 DIAGNOSIS — I10 Essential (primary) hypertension: Secondary | ICD-10-CM | POA: Diagnosis not present

## 2015-11-28 DIAGNOSIS — N183 Chronic kidney disease, stage 3 (moderate): Secondary | ICD-10-CM

## 2015-11-28 DIAGNOSIS — E559 Vitamin D deficiency, unspecified: Secondary | ICD-10-CM

## 2015-11-28 DIAGNOSIS — E1122 Type 2 diabetes mellitus with diabetic chronic kidney disease: Secondary | ICD-10-CM | POA: Diagnosis not present

## 2015-11-28 LAB — BASIC METABOLIC PANEL
BUN: 16 mg/dL (ref 6–23)
CALCIUM: 10 mg/dL (ref 8.4–10.5)
CO2: 28 mEq/L (ref 19–32)
Chloride: 105 mEq/L (ref 96–112)
Creatinine, Ser: 0.8 mg/dL (ref 0.40–1.20)
GFR: 87.6 mL/min (ref >60.00)
Glucose, Bld: 111 mg/dL — ABNORMAL HIGH (ref 70–99)
POTASSIUM: 4 meq/L (ref 3.5–5.1)
SODIUM: 142 meq/L (ref 135–145)

## 2015-11-28 LAB — HEMOGLOBIN A1C: HEMOGLOBIN A1C: 5.3 % (ref 4.6–6.5)

## 2015-11-28 MED ORDER — HYDROXYZINE HCL 10 MG PO TABS: ORAL_TABLET | ORAL | Status: DC

## 2015-11-28 MED ORDER — NABUMETONE 500 MG PO TABS: 500.0000 mg | ORAL_TABLET | Freq: Every day | ORAL | Status: DC

## 2015-11-28 NOTE — Assessment & Plan Note (Signed)
L 5th digit ganglion cyst - large Options discussed Ortho ref offered

## 2015-11-28 NOTE — Assessment & Plan Note (Signed)
On Vit D 

## 2015-11-28 NOTE — Assessment & Plan Note (Signed)
On Metformin, Glimepiride  

## 2015-11-28 NOTE — Assessment & Plan Note (Signed)
Wt Readings from Last 3 Encounters:  11/28/15 229 lb (103.874 kg)  08/22/15 237 lb (107.502 kg)  05/21/15 234 lb (106.142 kg)

## 2015-11-28 NOTE — Progress Notes (Signed)
Subjective:  Patient ID: Holly Banks, female    DOB: 21-Sep-1928  Age: 82 y.o. MRN: [id number]  CC: No chief complaint on file.   HPI Holly Banks presents for a f/u on DM, HTN, OA. C/o R side neck pain w/head turning x 1 mo - it is better now. Rubbing helped  Outpatient Prescriptions Prior to Visit  Medication Sig Dispense Refill  . aspirin 81 MG tablet Take 81 mg by mouth daily.    . Cholecalciferol (EQL VITAMIN D3) 1000 UNITS tablet Take 1,000 Units by mouth daily. For supplement    . glimepiride (AMARYL) 2 MG tablet Take 1 tablet (2 mg total) by mouth daily with breakfast. 90 tablet 3  . HYDROcodone-acetaminophen (NORCO/VICODIN) 5-325 MG tablet Take 1 tablet by mouth every 6 (six) hours as needed for moderate pain or severe pain. 120 tablet 0  . hydrOXYzine (ATARAX/VISTARIL) 10 MG tablet TAKE 1 TABLET BY MOUTH THREE TIMES DAILY AS NEEDED FOR ITCHING 60 tablet 4  . losartan (COZAAR) 50 MG tablet Take 1 tablet (50 mg total) by mouth daily. For HTN 90 tablet 3  . metFORMIN (GLUCOPHAGE) 500 MG tablet Take 1 tablet (500 mg total) by mouth 2 (two) times daily with a meal. For diabetes 180 tablet 3  . potassium chloride (KLOR-CON 10) 10 MEQ tablet Take 1 tablet (10 mEq total) by mouth daily. 90 tablet 3  . verapamil (VERELAN PM) 360 MG 24 hr capsule Take 1 capsule (360 mg total) by mouth at bedtime. For HTN 90 capsule 3  . vitamin B-12 (CYANOCOBALAMIN) 1000 MCG tablet Take 1,000 mcg by mouth daily.      [name] allopurinol (ZYLOPRIM) 300 MG tablet Take 1 tablet (300 mg total) by mouth daily. For gout (Patient not taking: Reported on 11/28/2015) 90 tablet 3   No facility-administered medications prior to visit.    ROS Review of Systems  Constitutional: Positive for fatigue. Negative for chills, activity change, appetite change and unexpected weight change.  HENT: Negative for congestion, mouth sores and sinus pressure.   Eyes: Negative for visual disturbance.  Respiratory: Negative for  cough and chest tightness.   Gastrointestinal: Negative for nausea and abdominal pain.  Genitourinary: Negative for frequency, difficulty urinating and vaginal pain.  Musculoskeletal: Positive for back pain, gait problem and neck pain.  Skin: Negative for pallor and rash.  Neurological: Negative for dizziness, tremors, weakness, numbness and headaches.  Psychiatric/Behavioral: Negative for confusion and sleep disturbance.    Objective:  BP 130/66 mmHg  Pulse 88  Wt 229 lb (103.874 kg)  SpO2 98%  BP Readings from Last 3 Encounters:  11/28/15 130/66  08/22/15 130/70  05/21/15 138/86    Wt Readings from Last 3 Encounters:  11/28/15 229 lb (103.874 kg)  08/22/15 237 lb (107.502 kg)  05/21/15 234 lb (106.142 kg)    Physical Exam  Constitutional: She appears well-developed. No distress.  HENT:  Head: Normocephalic.  Right Ear: External ear normal.  Left Ear: External ear normal.  Nose: Nose normal.  Mouth/Throat: Oropharynx is clear and moist.  Eyes: Conjunctivae are normal. Pupils are equal, round, and reactive to light. Right eye exhibits no discharge. Left eye exhibits no discharge.  Neck: Normal range of motion. Neck supple. No JVD present. No tracheal deviation present. No thyromegaly present.  Cardiovascular: Normal rate, regular rhythm and normal heart sounds.   Pulmonary/Chest: No stridor. No respiratory distress. She has no wheezes.  Abdominal: Soft. Bowel sounds are normal. She exhibits no distension and no  mass. There is no tenderness. There is no rebound and no guarding.  Musculoskeletal: She exhibits tenderness. She exhibits no edema.  Lymphadenopathy:    She has no cervical adenopathy.  Neurological: She displays normal reflexes. No cranial nerve deficit. She exhibits normal muscle tone. Coordination abnormal.  Skin: No rash noted. No erythema.  Psychiatric: She has a normal mood and affect. Her behavior is normal. Judgment and thought content normal.    Walker Obese R post neck is tender  L 5th digit ganglion cyst - large Lab Results  Component Value Date   WBC 6.9 11/12/2013   HGB 10.1* 11/12/2013   HCT 30.7* 11/12/2013   PLT 214 11/12/2013   GLUCOSE 146* 08/18/2015   CHOL 198 05/04/2011   TRIG 116.0 05/04/2011   HDL 62.30 05/04/2011   LDLDIRECT 126.1 03/31/2010   LDLCALC 113* 05/04/2011   ALT 15 05/04/2011   AST 18 05/04/2011   NA 142 08/18/2015   K 4.2 08/18/2015   CL 103 08/18/2015   CREATININE 0.80 08/18/2015   BUN 20 08/18/2015   CO2 29 08/18/2015   TSH 2.92 08/01/2012   INR 2.2* 10/25/2007   HGBA1C 5.8 08/18/2015   MICROALBUR 26.2* 06/30/2010    Dg Bone Density  01/06/2014  BMD is normal @ all sites evaluated ; recheck in 3-5 years is appropriate  based on studies in the medical  literature ( JAMA .[id number])   Assessment & Plan:   There are no diagnoses linked to this encounter. I am having Holly Banks maintain her vitamin B-12, Cholecalciferol, aspirin, allopurinol, hydrOXYzine, glimepiride, losartan, metFORMIN, potassium chloride, verapamil, and HYDROcodone-acetaminophen.  No orders of the defined types were placed in this encounter.     Follow-up: No Follow-up on file.  [name], MD

## 2015-11-28 NOTE — Assessment & Plan Note (Signed)
Losartan, Verapamil

## 2015-11-28 NOTE — Progress Notes (Signed)
Pre visit review using our clinic review tool, if applicable. No additional management support is needed unless otherwise documented below in the visit note. 

## 2015-11-28 NOTE — Assessment & Plan Note (Signed)
On B12 

## 2016-02-25 ENCOUNTER — Other Ambulatory Visit (INDEPENDENT_AMBULATORY_CARE_PROVIDER_SITE_OTHER): Payer: Medicare PPO

## 2016-02-25 DIAGNOSIS — E1122 Type 2 diabetes mellitus with diabetic chronic kidney disease: Secondary | ICD-10-CM

## 2016-02-25 DIAGNOSIS — N183 Chronic kidney disease, stage 3 (moderate): Secondary | ICD-10-CM | POA: Diagnosis not present

## 2016-02-25 LAB — BASIC METABOLIC PANEL
BUN: 18 mg/dL (ref 6–23)
CALCIUM: 9.3 mg/dL (ref 8.4–10.5)
CHLORIDE: 105 meq/L (ref 96–112)
CO2: 30 meq/L (ref 19–32)
Creatinine, Ser: 0.88 mg/dL (ref 0.40–1.20)
GFR: 78.43 mL/min (ref >60.00)
Glucose, Bld: 108 mg/dL — ABNORMAL HIGH (ref 70–99)
Potassium: 3.8 mEq/L (ref 3.5–5.1)
SODIUM: 143 meq/L (ref 135–145)

## 2016-02-25 LAB — HEMOGLOBIN A1C: Hgb A1c MFr Bld: 5.3 % (ref 4.6–6.5)

## 2016-02-27 ENCOUNTER — Ambulatory Visit: Payer: Medicare PPO | Admitting: Internal Medicine

## 2016-02-27 ENCOUNTER — Encounter: Payer: Self-pay | Admitting: Internal Medicine

## 2016-02-27 ENCOUNTER — Ambulatory Visit (INDEPENDENT_AMBULATORY_CARE_PROVIDER_SITE_OTHER): Payer: Medicare PPO | Admitting: Internal Medicine

## 2016-02-27 DIAGNOSIS — N183 Chronic kidney disease, stage 3 unspecified: Secondary | ICD-10-CM

## 2016-02-27 DIAGNOSIS — E538 Deficiency of other specified B group vitamins: Secondary | ICD-10-CM | POA: Diagnosis not present

## 2016-02-27 DIAGNOSIS — Z23 Encounter for immunization: Secondary | ICD-10-CM

## 2016-02-27 DIAGNOSIS — I10 Essential (primary) hypertension: Secondary | ICD-10-CM

## 2016-02-27 DIAGNOSIS — E1122 Type 2 diabetes mellitus with diabetic chronic kidney disease: Secondary | ICD-10-CM

## 2016-02-27 DIAGNOSIS — M25512 Pain in left shoulder: Secondary | ICD-10-CM | POA: Diagnosis not present

## 2016-02-27 MED ORDER — METHYLPREDNISOLONE ACETATE 80 MG/ML IJ SUSP
80.0000 mg | Freq: Once | INTRAMUSCULAR | Status: AC
  Administered 2016-02-27: 80 mg via INTRAMUSCULAR

## 2016-02-27 NOTE — Patient Instructions (Signed)
Postprocedure instructions :    A Band-Aid should be left on for 12 hours. Injection therapy is not a cure itself. It is used in conjunction with other modalities. You can use nonsteroidal anti-inflammatories like ibuprofen , hot and cold compresses. Rest is recommended in the next 24 hours. You need to report immediately  if fever, chills or any signs of infection develop. 

## 2016-02-27 NOTE — Assessment & Plan Note (Signed)
Losartan, Verapamil

## 2016-02-27 NOTE — Assessment & Plan Note (Signed)
On B12 

## 2016-02-27 NOTE — Progress Notes (Signed)
Pre visit review using our clinic review tool, if applicable. No additional management support is needed unless otherwise documented below in the visit note. 

## 2016-02-27 NOTE — Progress Notes (Signed)
Subjective:  Patient ID: Holly Banks, female    DOB: 26-Jan-1929  Age: 82 y.o. MRN: [id number]  CC: No chief complaint on file.   HPI Holly Banks presents for DM, HTN, OA f/u C/o L shoulder pain - worse  Outpatient Medications Prior to Visit  Medication Sig Dispense Refill  . aspirin 81 MG tablet Take 81 mg by mouth daily.    . Cholecalciferol (EQL VITAMIN D3) 1000 UNITS tablet Take 1,000 Units by mouth daily. For supplement    . glimepiride (AMARYL) 2 MG tablet Take 1 tablet (2 mg total) by mouth daily with breakfast. 90 tablet 3  . hydrOXYzine (ATARAX/VISTARIL) 10 MG tablet TAKE 1 TABLET BY MOUTH THREE TIMES DAILY AS NEEDED FOR ITCHING 60 tablet 4  . losartan (COZAAR) 50 MG tablet Take 1 tablet (50 mg total) by mouth daily. For HTN 90 tablet 3  . metFORMIN (GLUCOPHAGE) 500 MG tablet Take 1 tablet (500 mg total) by mouth 2 (two) times daily with a meal. For diabetes 180 tablet 3  . nabumetone (RELAFEN) 500 MG tablet Take 1 tablet (500 mg total) by mouth daily. w/food 15 tablet 0  . potassium chloride (KLOR-CON 10) 10 MEQ tablet Take 1 tablet (10 mEq total) by mouth daily. 90 tablet 3  . verapamil (VERELAN PM) 360 MG 24 hr capsule Take 1 capsule (360 mg total) by mouth at bedtime. For HTN 90 capsule 3  . vitamin B-12 (CYANOCOBALAMIN) 1000 MCG tablet Take 1,000 mcg by mouth daily.       No facility-administered medications prior to visit.     ROS Review of Systems  Constitutional: Negative for activity change, appetite change, chills, fatigue and unexpected weight change.  HENT: Negative for congestion, mouth sores and sinus pressure.   Eyes: Negative for visual disturbance.  Respiratory: Negative for cough and chest tightness.   Gastrointestinal: Negative for abdominal pain and nausea.  Genitourinary: Negative for difficulty urinating, frequency and vaginal pain.  Musculoskeletal: Positive for arthralgias, back pain and gait problem.  Skin: Negative for pallor and rash.    Neurological: Negative for dizziness, tremors, weakness, numbness and headaches.  Psychiatric/Behavioral: Negative for confusion and sleep disturbance.    Objective:  BP 118/64   Pulse 64   Temp 98.7 F (37.1 C) (Oral)   Wt 229 lb (103.9 kg)   SpO2 96%   BMI 40.57 kg/m   BP Readings from Last 3 Encounters:  02/27/16 118/64  11/28/15 130/66  08/22/15 130/70    Wt Readings from Last 3 Encounters:  02/27/16 229 lb (103.9 kg)  11/28/15 229 lb (103.9 kg)  08/22/15 237 lb (107.5 kg)    Physical Exam  Constitutional: She appears well-developed. No distress.  HENT:  Head: Normocephalic.  Right Ear: External ear normal.  Left Ear: External ear normal.  Nose: Nose normal.  Mouth/Throat: Oropharynx is clear and moist.  Eyes: Conjunctivae are normal. Pupils are equal, round, and reactive to light. Right eye exhibits no discharge. Left eye exhibits no discharge.  Neck: Normal range of motion. Neck supple. No JVD present. No tracheal deviation present. No thyromegaly present.  Cardiovascular: Normal rate, regular rhythm and normal heart sounds.   Pulmonary/Chest: No stridor. No respiratory distress. She has no wheezes.  Abdominal: Soft. Bowel sounds are normal. She exhibits no distension and no mass. There is no tenderness. There is no rebound and no guarding.  Musculoskeletal: She exhibits tenderness. She exhibits no edema.  Lymphadenopathy:    She has no cervical adenopathy.  Neurological: She displays normal reflexes. No cranial nerve deficit. She exhibits normal muscle tone. Coordination abnormal.  Skin: No rash noted. No erythema.  Psychiatric: She has a normal mood and affect. Her behavior is normal. Judgment and thought content normal.  L shoulder is tender w/cracking sounds present w/ROM Gilford Rile    Procedure :Joint Injection,  L shoulder   Indication:  Subacromial bursitis with refractory  chronic pain.   Risks including unsuccessful procedure , bleeding, infection,  bruising, skin atrophy, "steroid flare-up" and others were explained to the patient in detail as well as the benefits. Informed consent was obtained and signed.   Tthe patient was placed in a comfortable position. Lateral approach was used. Skin was prepped with Betadine and alcohol  and anesthetized with a cooling spray. Then, a 5 cc syringe with a 2 inch long 24-gauge needle was used for a joint injection.. The needle was advanced  Into the subacromial space.The bursa was injected with 3 mL of 2% lidocaine and 40 mg of Depo-Medrol .  Band-Aid was applied.   Tolerated well. Complications: None. Good pain relief following the procedure.   Postprocedure instructions :    A Band-Aid should be left on for 12 hours. Injection therapy is not a cure itself. It is used in conjunction with other modalities. You can use nonsteroidal anti-inflammatories like ibuprofen , hot and cold compresses. Rest is recommended in the next 24 hours. You need to report immediately  if fever, chills or any signs of infection develop.   Lab Results  Component Value Date   WBC 6.9 11/12/2013   HGB 10.1 (L) 11/12/2013   HCT 30.7 (L) 11/12/2013   PLT 214 11/12/2013   GLUCOSE 108 (H) 02/25/2016   CHOL 198 05/04/2011   TRIG 116.0 05/04/2011   HDL 62.30 05/04/2011   LDLDIRECT 126.1 03/31/2010   LDLCALC 113 (H) 05/04/2011   ALT 15 05/04/2011   AST 18 05/04/2011   NA 143 02/25/2016   K 3.8 02/25/2016   CL 105 02/25/2016   CREATININE 0.88 02/25/2016   BUN 18 02/25/2016   CO2 30 02/25/2016   TSH 2.92 08/01/2012   INR 2.2 (H) 10/25/2007   HGBA1C 5.3 02/25/2016   MICROALBUR 26.2 (H) 06/30/2010    Dg Bone Density  Result Date: 01/06/2014 BMD is normal @ all sites evaluated ; recheck in 3-5 years is appropriate  based on studies in the medical  literature ( JAMA .[id number])   Assessment & Plan:   There are no diagnoses linked to this encounter. I am having Holly Banks maintain her vitamin B-12,  Cholecalciferol, aspirin, glimepiride, losartan, metFORMIN, potassium chloride, verapamil, hydrOXYzine, and nabumetone.  No orders of the defined types were placed in this encounter.    Follow-up: No Follow-up on file.  [name], MD

## 2016-02-27 NOTE — Assessment & Plan Note (Signed)
On Metformin, Glimepiride  

## 2016-05-18 ENCOUNTER — Other Ambulatory Visit (INDEPENDENT_AMBULATORY_CARE_PROVIDER_SITE_OTHER): Payer: Medicare PPO

## 2016-05-18 DIAGNOSIS — E539 Vitamin B deficiency, unspecified: Secondary | ICD-10-CM | POA: Diagnosis not present

## 2016-05-18 DIAGNOSIS — E119 Type 2 diabetes mellitus without complications: Secondary | ICD-10-CM

## 2016-05-18 DIAGNOSIS — I10 Essential (primary) hypertension: Secondary | ICD-10-CM

## 2016-05-18 LAB — BASIC METABOLIC PANEL
BUN: 14 mg/dL (ref 6–23)
CHLORIDE: 105 meq/L (ref 96–112)
CO2: 28 mEq/L (ref 19–32)
Calcium: 9.8 mg/dL (ref 8.4–10.5)
Creatinine, Ser: 0.75 mg/dL (ref 0.40–1.20)
GFR: 94.27 mL/min (ref >60.00)
GLUCOSE: 98 mg/dL (ref 70–99)
POTASSIUM: 4 meq/L (ref 3.5–5.1)
SODIUM: 142 meq/L (ref 135–145)

## 2016-05-18 LAB — HEMOGLOBIN A1C: HEMOGLOBIN A1C: 5.5 % (ref 4.6–6.5)

## 2016-05-24 ENCOUNTER — Encounter: Payer: Self-pay | Admitting: Internal Medicine

## 2016-05-24 ENCOUNTER — Ambulatory Visit (INDEPENDENT_AMBULATORY_CARE_PROVIDER_SITE_OTHER): Payer: Medicare PPO | Admitting: Internal Medicine

## 2016-05-24 DIAGNOSIS — M542 Cervicalgia: Secondary | ICD-10-CM | POA: Diagnosis not present

## 2016-05-24 DIAGNOSIS — E1122 Type 2 diabetes mellitus with diabetic chronic kidney disease: Secondary | ICD-10-CM

## 2016-05-24 DIAGNOSIS — I10 Essential (primary) hypertension: Secondary | ICD-10-CM | POA: Diagnosis not present

## 2016-05-24 DIAGNOSIS — N183 Chronic kidney disease, stage 3 (moderate): Secondary | ICD-10-CM

## 2016-05-24 MED ORDER — MELOXICAM 7.5 MG PO TABS: ORAL_TABLET | ORAL | 1 refills | Status: DC

## 2016-05-24 NOTE — Assessment & Plan Note (Signed)
Wt Readings from Last 3 Encounters:  05/24/16 224 lb (101.6 kg)  02/27/16 229 lb (103.9 kg)  11/28/15 229 lb (103.9 kg)

## 2016-05-24 NOTE — Progress Notes (Signed)
Subjective:  Patient ID: Holly Banks, female    DOB: 08-23-1928  Age: 82 y.o. MRN: [id number]  CC: No chief complaint on file.   Neck Pain   This is a new problem. The current episode started more than 1 month ago. The problem occurs constantly. The problem has been waxing and waning. The pain is associated with an unknown factor. The pain is present in the occipital region and right side. The quality of the pain is described as stabbing. The pain is mild. The symptoms are aggravated by position. Pertinent negatives include no chest pain, headaches, trouble swallowing, weakness or weight loss. She has tried acetaminophen for the symptoms. The treatment provided mild relief.   Holly Banks presents for DM, HTN, B12 def f/u  Outpatient Medications Prior to Visit  Medication Sig Dispense Refill  . aspirin 81 MG tablet Take 81 mg by mouth daily.    . Cholecalciferol (EQL VITAMIN D3) 1000 UNITS tablet Take 1,000 Units by mouth daily. For supplement    . glimepiride (AMARYL) 2 MG tablet Take 1 tablet (2 mg total) by mouth daily with breakfast. 90 tablet 3  . hydrOXYzine (ATARAX/VISTARIL) 10 MG tablet TAKE 1 TABLET BY MOUTH THREE TIMES DAILY AS NEEDED FOR ITCHING 60 tablet 4  . losartan (COZAAR) 50 MG tablet Take 1 tablet (50 mg total) by mouth daily. For HTN 90 tablet 3  . metFORMIN (GLUCOPHAGE) 500 MG tablet Take 1 tablet (500 mg total) by mouth 2 (two) times daily with a meal. For diabetes 180 tablet 3  . nabumetone (RELAFEN) 500 MG tablet Take 1 tablet (500 mg total) by mouth daily. w/food 15 tablet 0  . potassium chloride (KLOR-CON 10) 10 MEQ tablet Take 1 tablet (10 mEq total) by mouth daily. 90 tablet 3  . verapamil (VERELAN PM) 360 MG 24 hr capsule Take 1 capsule (360 mg total) by mouth at bedtime. For HTN 90 capsule 3  . vitamin B-12 (CYANOCOBALAMIN) 1000 MCG tablet Take 1,000 mcg by mouth daily.       No facility-administered medications prior to visit.     ROS Review of  Systems  Constitutional: Negative for weight loss.  HENT: Negative for trouble swallowing.   Cardiovascular: Negative for chest pain.  Musculoskeletal: Positive for neck pain.  Neurological: Negative for weakness and headaches.    Objective:  BP 134/80   Pulse 82   Wt 224 lb (101.6 kg)   SpO2 96%   BMI 39.68 kg/m   BP Readings from Last 3 Encounters:  05/24/16 134/80  02/27/16 118/64  11/28/15 130/66    Wt Readings from Last 3 Encounters:  05/24/16 224 lb (101.6 kg)  02/27/16 229 lb (103.9 kg)  11/28/15 229 lb (103.9 kg)    Physical Exam  Lab Results  Component Value Date   WBC 6.9 11/12/2013   HGB 10.1 (L) 11/12/2013   HCT 30.7 (L) 11/12/2013   PLT 214 11/12/2013   GLUCOSE 98 05/18/2016   CHOL 198 05/04/2011   TRIG 116.0 05/04/2011   HDL 62.30 05/04/2011   LDLDIRECT 126.1 03/31/2010   LDLCALC 113 (H) 05/04/2011   ALT 15 05/04/2011   AST 18 05/04/2011   NA 142 05/18/2016   K 4.0 05/18/2016   CL 105 05/18/2016   CREATININE 0.75 05/18/2016   BUN 14 05/18/2016   CO2 28 05/18/2016   TSH 2.92 08/01/2012   INR 2.2 (H) 10/25/2007   HGBA1C 5.5 05/18/2016   MICROALBUR 26.2 (H) 06/30/2010  Dg Bone Density  Result Date: 01/06/2014 BMD is normal @ all sites evaluated ; recheck in 3-5 years is appropriate  based on studies in the medical  literature ( JAMA .[id number])   Assessment & Plan:   There are no diagnoses linked to this encounter. I am having Ms. Brine maintain her vitamin B-12, Cholecalciferol, aspirin, glimepiride, losartan, metFORMIN, potassium chloride, verapamil, hydrOXYzine, and nabumetone.  No orders of the defined types were placed in this encounter.    Follow-up: No Follow-up on file.  [name], MD

## 2016-05-24 NOTE — Progress Notes (Signed)
Pre visit review using our clinic review tool, if applicable. No additional management support is needed unless otherwise documented below in the visit note. 

## 2016-05-24 NOTE — Assessment & Plan Note (Signed)
Glimepiride, Metformin  Low dose Meloxicam short term

## 2016-05-24 NOTE — Assessment & Plan Note (Signed)
Losartan, Verapamil

## 2016-05-24 NOTE — Patient Instructions (Addendum)
Trigger Finger  Trigger finger (digital tendinitis and stenosing tenosynovitis) is a common disorder that causes an often painful catching of the fingers or thumb. It occurs as a clicking, snapping, or locking of a finger in the palm of the hand. This is caused by a problem with the tendons that flex or bend the fingers sliding smoothly through their sheaths. The condition may occur in any finger or a couple fingers at the same time.   The finger may lock with the finger curled or suddenly straighten out with a snap. This is more common in patients with rheumatoid arthritis and diabetes. Left untreated, the condition may get worse to the point where the finger becomes locked in flexion, like making a fist, or less commonly locked with the finger straightened out.  CAUSES    Inflammation and scarring that lead to swelling around the tendon sheath.   Repeated or forceful movements.   Rheumatoid arthritis, an autoimmune disease that affects joints.   Gout.   Diabetes mellitus.  SIGNS AND SYMPTOMS   Soreness and swelling of your finger.   A painful clicking or snapping as you bend and straighten your finger.  DIAGNOSIS   Your health care provider will do a physical exam of your finger to diagnose trigger finger.  TREATMENT    Splinting for 6-8 weeks may be helpful.   Nonsteroidal anti-inflammatory medicines (NSAIDs) can help to relieve the pain and inflammation.   Cortisone injections, along with splinting, may speed up recovery. Several injections may be required. Cortisone may give relief after one injection.   Surgery is another treatment that may be used if conservative treatments do not work. Surgery can be minor, without incisions (a cut does not have to be made), and can be done with a needle through the skin.   Other surgical choices involve an open procedure in which the surgeon opens the hand through a small incision and cuts the pulley so the tendon can again slide smoothly. Your hand will still  work fine.  HOME CARE INSTRUCTIONS   Apply ice to the injured area, twice per day:    Put ice in a plastic bag.    Place a towel between your skin and the bag.    Leave the ice on for 20 minutes, 3-4 times a day.   Rest your hand often.  MAKE SURE YOU:    Understand these instructions.   Will watch your condition.   Will get help right away if you are not doing well or get worse.     This information is not intended to replace advice given to you by your health care provider. Make sure you discuss any questions you have with your health care provider.     Document Released: 03/13/2004 Document Revised: 01/24/2013 Document Reviewed: 10/24/2012  Elsevier Interactive Patient Education 2017 [employer]

## 2016-05-24 NOTE — Assessment & Plan Note (Signed)
X ray  Use a rice bag Meloxicam w/caution

## 2016-08-12 ENCOUNTER — Other Ambulatory Visit: Payer: Self-pay | Admitting: Internal Medicine

## 2016-08-19 ENCOUNTER — Other Ambulatory Visit (INDEPENDENT_AMBULATORY_CARE_PROVIDER_SITE_OTHER): Payer: Medicare PPO

## 2016-08-19 DIAGNOSIS — E119 Type 2 diabetes mellitus without complications: Secondary | ICD-10-CM

## 2016-08-19 DIAGNOSIS — I10 Essential (primary) hypertension: Secondary | ICD-10-CM | POA: Diagnosis not present

## 2016-08-19 DIAGNOSIS — E539 Vitamin B deficiency, unspecified: Secondary | ICD-10-CM | POA: Diagnosis not present

## 2016-08-19 LAB — BASIC METABOLIC PANEL
BUN: 10 mg/dL (ref 6–23)
CHLORIDE: 105 meq/L (ref 96–112)
CO2: 29 meq/L (ref 19–32)
Calcium: 9.9 mg/dL (ref 8.4–10.5)
Creatinine, Ser: 0.8 mg/dL (ref 0.40–1.20)
GFR: 87.45 mL/min (ref >60.00)
GLUCOSE: 106 mg/dL — AB (ref 70–99)
POTASSIUM: 3.7 meq/L (ref 3.5–5.1)
Sodium: 143 mEq/L (ref 135–145)

## 2016-08-19 LAB — HEMOGLOBIN A1C: Hgb A1c MFr Bld: 5.4 % (ref 4.6–6.5)

## 2016-08-23 ENCOUNTER — Encounter: Payer: Self-pay | Admitting: Internal Medicine

## 2016-08-23 ENCOUNTER — Ambulatory Visit (INDEPENDENT_AMBULATORY_CARE_PROVIDER_SITE_OTHER): Payer: Medicare PPO | Admitting: Internal Medicine

## 2016-08-23 ENCOUNTER — Other Ambulatory Visit (INDEPENDENT_AMBULATORY_CARE_PROVIDER_SITE_OTHER): Payer: Medicare PPO

## 2016-08-23 VITALS — BP 122/72 | HR 77 | Temp 98.6°F | Resp 16 | Ht 63.0 in | Wt 223.2 lb

## 2016-08-23 DIAGNOSIS — N183 Chronic kidney disease, stage 3 unspecified: Secondary | ICD-10-CM

## 2016-08-23 DIAGNOSIS — R6883 Chills (without fever): Secondary | ICD-10-CM

## 2016-08-23 DIAGNOSIS — M1000 Idiopathic gout, unspecified site: Secondary | ICD-10-CM | POA: Diagnosis not present

## 2016-08-23 DIAGNOSIS — I10 Essential (primary) hypertension: Secondary | ICD-10-CM | POA: Diagnosis not present

## 2016-08-23 DIAGNOSIS — M150 Primary generalized (osteo)arthritis: Secondary | ICD-10-CM | POA: Diagnosis not present

## 2016-08-23 DIAGNOSIS — E1122 Type 2 diabetes mellitus with diabetic chronic kidney disease: Secondary | ICD-10-CM | POA: Diagnosis not present

## 2016-08-23 DIAGNOSIS — M159 Polyosteoarthritis, unspecified: Secondary | ICD-10-CM

## 2016-08-23 DIAGNOSIS — D649 Anemia, unspecified: Secondary | ICD-10-CM

## 2016-08-23 LAB — CBC WITH DIFFERENTIAL/PLATELET
BASOS ABS: 0 10*3/uL (ref 0.0–0.1)
Basophils Relative: 0.4 % (ref 0.0–3.0)
EOS PCT: 1.8 % (ref 0.0–5.0)
Eosinophils Absolute: 0.1 10*3/uL (ref 0.0–0.7)
HEMATOCRIT: 33.7 % — AB (ref 36.0–46.0)
Hemoglobin: 10.6 g/dL — ABNORMAL LOW (ref 12.0–15.0)
LYMPHS PCT: 33.2 % (ref 12.0–46.0)
Lymphs Abs: 2.2 10*3/uL (ref 0.7–4.0)
MCHC: 31.5 g/dL (ref 30.0–36.0)
MCV: 83.2 fl (ref 78.0–100.0)
MONOS PCT: 10.3 % (ref 3.0–12.0)
Monocytes Absolute: 0.7 10*3/uL (ref 0.1–1.0)
NEUTROS ABS: 3.5 10*3/uL (ref 1.4–7.7)
Neutrophils Relative %: 54.3 % (ref 43.0–77.0)
PLATELETS: 232 10*3/uL (ref 150.0–400.0)
RBC: 4.05 Mil/uL (ref 3.87–5.11)
RDW: 14 % (ref 11.5–15.5)
WBC: 6.5 10*3/uL (ref 4.0–10.5)

## 2016-08-23 LAB — BASIC METABOLIC PANEL
BUN: 11 mg/dL (ref 6–23)
CALCIUM: 9.8 mg/dL (ref 8.4–10.5)
CO2: 28 meq/L (ref 19–32)
CREATININE: 0.74 mg/dL (ref 0.40–1.20)
Chloride: 107 mEq/L (ref 96–112)
GFR: 95.68 mL/min (ref >60.00)
GLUCOSE: 115 mg/dL — AB (ref 70–99)
Potassium: 4 mEq/L (ref 3.5–5.1)
Sodium: 142 mEq/L (ref 135–145)

## 2016-08-23 LAB — HEPATIC FUNCTION PANEL
ALBUMIN: 3.8 g/dL (ref 3.5–5.2)
ALK PHOS: 54 U/L (ref 39–117)
ALT: 7 U/L (ref 0–35)
AST: 12 U/L (ref 0–37)
Bilirubin, Direct: 0.1 mg/dL (ref 0.0–0.3)
TOTAL PROTEIN: 6.9 g/dL (ref 6.0–8.3)
Total Bilirubin: 0.4 mg/dL (ref 0.2–1.2)

## 2016-08-23 LAB — URINALYSIS, ROUTINE W REFLEX MICROSCOPIC
BILIRUBIN URINE: NEGATIVE
KETONES UR: NEGATIVE
NITRITE: POSITIVE — AB
Specific Gravity, Urine: 1.02 (ref 1.000–1.030)
Urine Glucose: NEGATIVE
Urobilinogen, UA: 0.2 (ref 0.0–1.0)
pH: 7 (ref 5.0–8.0)

## 2016-08-23 LAB — MICROALBUMIN / CREATININE URINE RATIO
Creatinine,U: 135 mg/dL
Microalb Creat Ratio: 8.8 mg/g (ref 0.0–30.0)
Microalb, Ur: 11.9 mg/dL — ABNORMAL HIGH (ref 0.0–1.9)

## 2016-08-23 LAB — HEMOGLOBIN A1C: Hgb A1c MFr Bld: 5.5 % (ref 4.6–6.5)

## 2016-08-23 LAB — TSH: TSH: 3 u[IU]/mL (ref 0.35–4.50)

## 2016-08-23 MED ORDER — CIPROFLOXACIN HCL 250 MG PO TABS: 250.0000 mg | ORAL_TABLET | Freq: Two times a day (BID) | ORAL | 0 refills | Status: DC

## 2016-08-23 MED ORDER — REPAGLINIDE 1 MG PO TABS: ORAL_TABLET | ORAL | 11 refills | Status: DC

## 2016-08-23 MED ORDER — HYDROXYZINE HCL 10 MG PO TABS: ORAL_TABLET | ORAL | 4 refills | Status: DC

## 2016-08-23 NOTE — Assessment & Plan Note (Addendum)
Labs Will start Prandin due to not eating 3/d D/c Glimepiride

## 2016-08-23 NOTE — Patient Instructions (Addendum)
Start Prandin (Repaglinide) before meals Stop Glimepiride

## 2016-08-23 NOTE — Assessment & Plan Note (Signed)
Losartan and Verapamil 

## 2016-08-23 NOTE — Progress Notes (Signed)
Pre-visit discussion using our clinic review tool. No additional management support is needed unless otherwise documented below in the visit note.  

## 2016-08-23 NOTE — Assessment & Plan Note (Signed)
labs

## 2016-08-23 NOTE — Assessment & Plan Note (Addendum)
?  etiology Labs 3/18 - start Prandin due to not eating 3/d D/c Glimepiride

## 2016-08-23 NOTE — Progress Notes (Signed)
Subjective:  Patient ID: Holly Banks, female    DOB: [birth year]  Age: 83 y.o. MRN: [id number]  CC: Follow-up (HTN, neck pain and back of head )   HPI Holly Banks presents for feeling "cold", shivering at times at home C/o anxiety F/u DM, HTN, OA  Outpatient Medications Prior to Visit  Medication Sig Dispense Refill  . aspirin 81 MG tablet Take 81 mg by mouth daily.    . Cholecalciferol (EQL VITAMIN D3) 1000 UNITS tablet Take 1,000 Units by mouth daily. For supplement    . glimepiride (AMARYL) 2 MG tablet TAKE 1 TABLET EVERY DAY WITH BREAKFAST 90 tablet 1  . hydrOXYzine (ATARAX/VISTARIL) 10 MG tablet TAKE 1 TABLET BY MOUTH THREE TIMES DAILY AS NEEDED FOR ITCHING 60 tablet 4  . losartan (COZAAR) 50 MG tablet TAKE 1 TABLET EVERY DAY  FOR  HYPERTENSION 90 tablet 1  . metFORMIN (GLUCOPHAGE) 500 MG tablet TAKE 1 TABLET TWICE DAILY WITH A MEAL FOR DIABETES 180 tablet 1  . potassium chloride (KLOR-CON 10) 10 MEQ tablet Take 1 tablet (10 mEq total) by mouth daily. 90 tablet 3  . verapamil (VERELAN PM) 360 MG 24 hr capsule TAKE 1 CAPSULE AT BEDTIME  FOR  HYPERTENSION 90 capsule 1  . vitamin B-12 (CYANOCOBALAMIN) 1000 MCG tablet Take 1,000 mcg by mouth daily.      . meloxicam (MOBIC) 7.5 MG tablet 1 po qd pc x week then prn pain 20 tablet 1  . nabumetone (RELAFEN) 500 MG tablet Take 1 tablet (500 mg total) by mouth daily. w/food 15 tablet 0   No facility-administered medications prior to visit.     ROS Review of Systems  Constitutional: Positive for chills. Negative for activity change, appetite change, fatigue and unexpected weight change.  HENT: Negative for congestion, mouth sores and sinus pressure.   Eyes: Negative for visual disturbance.  Respiratory: Negative for cough and chest tightness.   Gastrointestinal: Negative for abdominal pain and nausea.  Genitourinary: Negative for difficulty urinating, frequency and vaginal pain.  Musculoskeletal: Positive for arthralgias,  back pain and gait problem.  Skin: Negative for color change, pallor and rash.  Neurological: Negative for dizziness, tremors, weakness, numbness and headaches.  Psychiatric/Behavioral: Negative for confusion and sleep disturbance.    Objective:  BP 122/72   Pulse 77   Temp 98.6 F (37 C) (Oral)   Resp 16   Ht 5\' 3"  (1.6 m)   Wt 223 lb 4 oz (101.3 kg)   SpO2 97%   BMI 39.55 kg/m   BP Readings from Last 3 Encounters:  08/23/16 122/72  05/24/16 134/80  02/27/16 118/64    Wt Readings from Last 3 Encounters:  08/23/16 223 lb 4 oz (101.3 kg)  05/24/16 224 lb (101.6 kg)  02/27/16 229 lb (103.9 kg)    Physical Exam  Constitutional: She appears well-developed. No distress.  HENT:  Head: Normocephalic.  Right Ear: External ear normal.  Left Ear: External ear normal.  Nose: Nose normal.  Mouth/Throat: Oropharynx is clear and moist.  Eyes: Conjunctivae are normal. Pupils are equal, round, and reactive to light. Right eye exhibits no discharge. Left eye exhibits no discharge.  Neck: Normal range of motion. Neck supple. No JVD present. No tracheal deviation present. No thyromegaly present.  Cardiovascular: Normal rate, regular rhythm and normal heart sounds.   Pulmonary/Chest: No stridor. No respiratory distress. She has no wheezes.  Abdominal: Soft. Bowel sounds are normal. She exhibits no distension and no mass. There is no  tenderness. There is no rebound and no guarding.  Musculoskeletal: She exhibits tenderness. She exhibits no edema.  Lymphadenopathy:    She has no cervical adenopathy.  Neurological: She displays normal reflexes. No cranial nerve deficit. She exhibits normal muscle tone. Coordination abnormal.  Skin: No rash noted. No erythema.  Psychiatric: She has a normal mood and affect. Her behavior is normal. Judgment and thought content normal.    Lab Results  Component Value Date   WBC 6.9 11/12/2013   HGB 10.1 (L) 11/12/2013   HCT 30.7 (L) 11/12/2013   PLT 214  11/12/2013   GLUCOSE 106 (H) 08/19/2016   CHOL 198 05/04/2011   TRIG 116.0 05/04/2011   HDL 62.30 05/04/2011   LDLDIRECT 126.1 03/31/2010   LDLCALC 113 (H) 05/04/2011   ALT 15 05/04/2011   AST 18 05/04/2011   NA 143 08/19/2016   K 3.7 08/19/2016   CL 105 08/19/2016   CREATININE 0.80 08/19/2016   BUN 10 08/19/2016   CO2 29 08/19/2016   TSH 2.92 08/01/2012   INR 2.2 (H) 10/25/2007   HGBA1C 5.4 08/19/2016   MICROALBUR 26.2 (H) 06/30/2010    Dg Bone Density  Result Date: 01/06/2014 BMD is normal @ all sites evaluated ; recheck in 3-5 years is appropriate  based on studies in the medical  literature ( JAMA .1840;375:4360-6770)   Assessment & Plan:   There are no diagnoses linked to this encounter. I have discontinued Ms. Kester's nabumetone and meloxicam. I am also having her maintain her vitamin B-12, Cholecalciferol, aspirin, potassium chloride, hydrOXYzine, losartan, verapamil, metFORMIN, and glimepiride.  No orders of the defined types were placed in this encounter.    Follow-up: No Follow-up on file.  [name], MD

## 2016-08-23 NOTE — Assessment & Plan Note (Signed)
Wt Readings from Last 3 Encounters:  08/23/16 223 lb 4 oz (101.3 kg)  05/24/16 224 lb (101.6 kg)  02/27/16 229 lb (103.9 kg)

## 2016-08-23 NOTE — Assessment & Plan Note (Signed)
Walker Norco prn

## 2016-11-29 ENCOUNTER — Other Ambulatory Visit (INDEPENDENT_AMBULATORY_CARE_PROVIDER_SITE_OTHER): Payer: Medicare PPO

## 2016-11-29 DIAGNOSIS — R6883 Chills (without fever): Secondary | ICD-10-CM | POA: Diagnosis not present

## 2016-11-29 DIAGNOSIS — E539 Vitamin B deficiency, unspecified: Secondary | ICD-10-CM | POA: Diagnosis not present

## 2016-11-29 DIAGNOSIS — I10 Essential (primary) hypertension: Secondary | ICD-10-CM | POA: Diagnosis not present

## 2016-11-29 DIAGNOSIS — E119 Type 2 diabetes mellitus without complications: Secondary | ICD-10-CM

## 2016-11-29 DIAGNOSIS — D649 Anemia, unspecified: Secondary | ICD-10-CM

## 2016-11-29 LAB — BASIC METABOLIC PANEL
BUN: 14 mg/dL (ref 6–23)
CHLORIDE: 108 meq/L (ref 96–112)
CO2: 27 mEq/L (ref 19–32)
CREATININE: 0.82 mg/dL (ref 0.40–1.20)
Calcium: 9.8 mg/dL (ref 8.4–10.5)
GFR: 84.94 mL/min (ref >60.00)
Glucose, Bld: 140 mg/dL — ABNORMAL HIGH (ref 70–99)
Potassium: 3.8 mEq/L (ref 3.5–5.1)
Sodium: 143 mEq/L (ref 135–145)

## 2016-11-29 LAB — IBC PANEL
IRON: 100 ug/dL (ref 42–145)
SATURATION RATIOS: 33.2 % (ref 20.0–50.0)
TRANSFERRIN: 215 mg/dL (ref 212.0–360.0)

## 2016-11-29 LAB — HEMOGLOBIN A1C: HEMOGLOBIN A1C: 5.7 % (ref 4.6–6.5)

## 2016-12-01 ENCOUNTER — Telehealth: Payer: Self-pay | Admitting: Internal Medicine

## 2016-12-01 ENCOUNTER — Ambulatory Visit (INDEPENDENT_AMBULATORY_CARE_PROVIDER_SITE_OTHER): Payer: Medicare PPO | Admitting: Internal Medicine

## 2016-12-01 ENCOUNTER — Encounter: Payer: Self-pay | Admitting: Internal Medicine

## 2016-12-01 DIAGNOSIS — I10 Essential (primary) hypertension: Secondary | ICD-10-CM | POA: Diagnosis not present

## 2016-12-01 DIAGNOSIS — E1122 Type 2 diabetes mellitus with diabetic chronic kidney disease: Secondary | ICD-10-CM | POA: Diagnosis not present

## 2016-12-01 DIAGNOSIS — N183 Chronic kidney disease, stage 3 (moderate): Secondary | ICD-10-CM | POA: Diagnosis not present

## 2016-12-01 DIAGNOSIS — E538 Deficiency of other specified B group vitamins: Secondary | ICD-10-CM | POA: Diagnosis not present

## 2016-12-01 MED ORDER — REPAGLINIDE 1 MG PO TABS: ORAL_TABLET | ORAL | 11 refills | Status: DC

## 2016-12-01 NOTE — Patient Instructions (Signed)
Use Prandin (repaglinide) only if you eat

## 2016-12-01 NOTE — Assessment & Plan Note (Signed)
On B12 

## 2016-12-01 NOTE — Telephone Encounter (Signed)
Called pt to r/s [facility] appt for 12/27/2016 at [place] [facility] will not be in the office at that time. Pt can r/s appt for 01/06/2017 at 11am. Vm full could not leave msg.

## 2016-12-01 NOTE — Assessment & Plan Note (Signed)
Use Prandin (repaglinide) only if you eat

## 2016-12-01 NOTE — Progress Notes (Signed)
Subjective:  Patient ID: Holly Banks, female    DOB: 24-Jan-1929  Age: 82 y.o. MRN: [id number]  CC: No chief complaint on file.   HPI Holly Banks presents for DM, HTN, B12 def C/o occ shaky feeling...  Outpatient Medications Prior to Visit  Medication Sig Dispense Refill  . aspirin 81 MG tablet Take 81 mg by mouth daily.    . Cholecalciferol (EQL VITAMIN D3) 1000 UNITS tablet Take 1,000 Units by mouth daily. For supplement    . ciprofloxacin (CIPRO) 250 MG tablet Take 1 tablet (250 mg total) by mouth 2 (two) times daily. 6 tablet 0  . hydrOXYzine (ATARAX/VISTARIL) 10 MG tablet TAKE 1 TABLET BY MOUTH THREE TIMES DAILY AS NEEDED FOR ITCHING 60 tablet 4  . losartan (COZAAR) 50 MG tablet TAKE 1 TABLET EVERY DAY  FOR  HYPERTENSION 90 tablet 1  . metFORMIN (GLUCOPHAGE) 500 MG tablet TAKE 1 TABLET TWICE DAILY WITH A MEAL FOR DIABETES 180 tablet 1  . potassium chloride (KLOR-CON 10) 10 MEQ tablet Take 1 tablet (10 mEq total) by mouth daily. 90 tablet 3  . verapamil (VERELAN PM) 360 MG 24 hr capsule TAKE 1 CAPSULE AT BEDTIME  FOR  HYPERTENSION 90 capsule 1  . vitamin B-12 (CYANOCOBALAMIN) 1000 MCG tablet Take 1,000 mcg by mouth daily.      . repaglinide (PRANDIN) 1 MG tablet 1 tab ac qd-tid 90 tablet 11   No facility-administered medications prior to visit.     ROS Review of Systems  Constitutional: Negative for activity change, appetite change, chills, fatigue and unexpected weight change.  HENT: Negative for congestion, mouth sores and sinus pressure.   Eyes: Negative for visual disturbance.  Respiratory: Negative for cough and chest tightness.   Gastrointestinal: Negative for abdominal pain and nausea.  Genitourinary: Negative for difficulty urinating, frequency and vaginal pain.  Musculoskeletal: Positive for arthralgias and gait problem. Negative for back pain.  Skin: Negative for pallor and rash.  Neurological: Negative for dizziness, tremors, weakness, numbness and  headaches.  Psychiatric/Behavioral: Negative for confusion and sleep disturbance.    Objective:  BP 122/74 (BP Location: Right Arm, Patient Position: Sitting, Cuff Size: Large)   Pulse 73   Temp 98.8 F (37.1 C) (Oral)   Ht 5\' 3"  (1.6 m)   Wt 224 lb (101.6 kg)   SpO2 99%   BMI 39.68 kg/m   BP Readings from Last 3 Encounters:  12/01/16 122/74  08/23/16 122/72  05/24/16 134/80    Wt Readings from Last 3 Encounters:  12/01/16 224 lb (101.6 kg)  08/23/16 223 lb 4 oz (101.3 kg)  05/24/16 224 lb (101.6 kg)    Physical Exam  Constitutional: She appears well-developed. No distress.  HENT:  Head: Normocephalic.  Right Ear: External ear normal.  Left Ear: External ear normal.  Nose: Nose normal.  Mouth/Throat: Oropharynx is clear and moist.  Eyes: Conjunctivae are normal. Pupils are equal, round, and reactive to light. Right eye exhibits no discharge. Left eye exhibits no discharge.  Neck: Normal range of motion. Neck supple. No JVD present. No tracheal deviation present. No thyromegaly present.  Cardiovascular: Normal rate, regular rhythm and normal heart sounds.   Pulmonary/Chest: No stridor. No respiratory distress. She has no wheezes.  Abdominal: Soft. Bowel sounds are normal. She exhibits no distension and no mass. There is no tenderness. There is no rebound and no guarding.  Musculoskeletal: She exhibits no edema or tenderness.  Lymphadenopathy:    She has no cervical adenopathy.  Neurological: She displays normal reflexes. No cranial nerve deficit. She exhibits normal muscle tone. Coordination abnormal.  Skin: No rash noted. No erythema.  Psychiatric: She has a normal mood and affect. Her behavior is normal. Judgment and thought content normal.   Walker  Lab Results  Component Value Date   WBC 6.5 08/23/2016   HGB 10.6 (L) 08/23/2016   HCT 33.7 (L) 08/23/2016   PLT 232.0 08/23/2016   GLUCOSE 140 (H) 11/29/2016   CHOL 198 05/04/2011   TRIG 116.0 05/04/2011   HDL  62.30 05/04/2011   LDLDIRECT 126.1 03/31/2010   LDLCALC 113 (H) 05/04/2011   ALT 7 08/23/2016   AST 12 08/23/2016   NA 143 11/29/2016   K 3.8 11/29/2016   CL 108 11/29/2016   CREATININE 0.82 11/29/2016   BUN 14 11/29/2016   CO2 27 11/29/2016   TSH 3.00 08/23/2016   INR 2.2 (H) 10/25/2007   HGBA1C 5.7 11/29/2016   MICROALBUR 11.9 (H) 08/23/2016    Dg Bone Density  Result Date: 01/06/2014 BMD is normal @ all sites evaluated ; recheck in 3-5 years is appropriate  based on studies in the medical  literature ( JAMA .1610;[id number])   Assessment & Plan:   Diagnoses and all orders for this visit:  Type 2 diabetes mellitus with stage 3 chronic kidney disease, without long-term current use of insulin (HCC)  Essential hypertension  Vitamin B 12 deficiency  Other orders -     repaglinide (PRANDIN) 1 MG tablet; 1 tab ac qd-tid. Use Prandin (repaglinide) only if you eat   I have changed Ms. Lundeen's repaglinide. I am also having her maintain her vitamin B-12, Cholecalciferol, aspirin, potassium chloride, losartan, verapamil, metFORMIN, hydrOXYzine, and ciprofloxacin.  Meds ordered this encounter  Medications  . repaglinide (PRANDIN) 1 MG tablet    Sig: 1 tab ac qd-tid. Use Prandin (repaglinide) only if you eat    Dispense:  90 tablet    Refill:  11     Follow-up: Return in about 3 months (around 03/03/2017) for a follow-up visit.  [name], MD

## 2016-12-20 ENCOUNTER — Other Ambulatory Visit: Payer: Self-pay | Admitting: Internal Medicine

## 2016-12-27 ENCOUNTER — Ambulatory Visit: Payer: Medicare PPO

## 2017-01-06 ENCOUNTER — Ambulatory Visit (INDEPENDENT_AMBULATORY_CARE_PROVIDER_SITE_OTHER): Payer: Medicare PPO | Admitting: *Deleted

## 2017-01-06 VITALS — BP 138/84 | HR 69 | Resp 20 | Ht 63.0 in | Wt 228.0 lb

## 2017-01-06 DIAGNOSIS — Z0000 Encounter for general adult medical examination without abnormal findings: Secondary | ICD-10-CM

## 2017-01-06 NOTE — Patient Instructions (Addendum)
Holly Banks , Thank you for taking time to come for your Medicare Wellness Visit. I appreciate your ongoing commitment to your health goals. Please review the following plan we discussed and let me know if I can assist you in the future.   These are the goals we discussed: Goals    . I want to start to go church          I will ride with my children to church and continue to read my bible in the morning       This is a list of the screening recommended for you and due dates:  Health Maintenance  Topic Date Due  . Eye exam for diabetics  07/28/1940  . Complete foot exam   12/14/2013  . Flu Shot  01/05/2017  . Hemoglobin A1C  05/31/2017  . Tetanus Vaccine  04/03/2020  . Pneumonia vaccines  Completed   [facility] [phone]  [place], [place]  [phone]. [phone] Open ? Closes 5PM  It is important to avoid accidents which may result in broken bones.  Here are a few ideas on how to make your home safer so you will be less likely to trip or fall.  1. Use nonskid mats or non slip strips in your shower or tub, on your bathroom floor and around sinks.  If you know that you have spilled water, wipe it up! 2. In the bathroom, it is important to have properly installed grab bars on the walls or on the edge of the tub.  Towel racks are NOT strong enough for you to hold onto or to pull on for support. 3. Stairs and hallways should have enough light.  Add lamps or night lights if you need ore light. 4. It is good to have handrails on both sides of the stairs if possible.  Always fix broken handrails right away. 5. It is important to see the edges of steps.  Paint the edges of outdoor steps white so you can see them better.  Put colored tape on the edge of inside steps. 6. Throw-rugs are dangerous because they can slide.  Removing the rugs is the best idea, but if they must stay, add adhesive carpet tape to prevent  slipping. 7. Do not keep things on stairs or in the halls.  Remove small furniture that blocks the halls as it may cause you to trip.  Keep telephone and electrical cords out of the way where you walk. 8. Always were sturdy, rubber-soled shoes for good support.  Never wear just socks, especially on the stairs.  Socks may cause you to slip or fall.  Do not wear full-length housecoats as you can easily trip on the bottom.  9. Place the things you use the most on the shelves that are the easiest to reach.  If you use a stepstool, make sure it is in good condition.  If you feel unsteady, DO NOT climb, ask for help. If a health professional advises you to use a cane or walker, do not be ashamed.  These items can keep you from falling and breaking your bones.  Insomnia Insomnia is a sleep disorder that makes it difficult to fall asleep or to stay asleep. Insomnia can cause tiredness (fatigue), low energy, difficulty concentrating, mood swings, and poor performance at work or school. There are three different ways to classify insomnia:  Difficulty falling asleep.  Difficulty staying asleep.  Waking  up too early in the morning.  Any type of insomnia can be long-term (chronic) or short-term (acute). Both are common. Short-term insomnia usually lasts for three months or less. Chronic insomnia occurs at least three times a week for longer than three months. What are the causes? Insomnia may be caused by another condition, situation, or substance, such as:  Anxiety.  Certain medicines.  Gastroesophageal reflux disease (GERD) or other gastrointestinal conditions.  Asthma or other breathing conditions.  Restless legs syndrome, sleep apnea, or other sleep disorders.  Chronic pain.  Menopause. This may include hot flashes.  Stroke.  Abuse of alcohol, tobacco, or illegal drugs.  Depression.  Caffeine.  Neurological disorders, such as Alzheimer disease.  An overactive thyroid  (hyperthyroidism).  The cause of insomnia may not be known. What increases the risk? Risk factors for insomnia include:  Gender. Women are more commonly affected than men.  Age. Insomnia is more common as you get older.  Stress. This may involve your professional or personal life.  Income. Insomnia is more common in people with lower income.  Lack of exercise.  Irregular work schedule or night shifts.  Traveling between different time zones.  What are the signs or symptoms? If you have insomnia, trouble falling asleep or trouble staying asleep is the main symptom. This may lead to other symptoms, such as:  Feeling fatigued.  Feeling nervous about going to sleep.  Not feeling rested in the morning.  Having trouble concentrating.  Feeling irritable, anxious, or depressed.  How is this treated? Treatment for insomnia depends on the cause. If your insomnia is caused by an underlying condition, treatment will focus on addressing the condition. Treatment may also include:  Medicines to help you sleep.  Counseling or therapy.  Lifestyle adjustments.  Follow these instructions at home:  Take medicines only as directed by your health care provider.  Keep regular sleeping and waking hours. Avoid naps.  Keep a sleep diary to help you and your health care provider figure out what could be causing your insomnia. Include: ? When you sleep. ? When you wake up during the night. ? How well you sleep. ? How rested you feel the next day. ? Any side effects of medicines you are taking. ? What you eat and drink.  Make your bedroom a comfortable place where it is easy to fall asleep: ? Put up shades or special blackout curtains to block light from outside. ? Use a white noise machine to block noise. ? Keep the temperature cool.  Exercise regularly as directed by your health care provider. Avoid exercising right before bedtime.  Use relaxation techniques to manage stress. Ask  your health care provider to suggest some techniques that may work well for you. These may include: ? Breathing exercises. ? Routines to release muscle tension. ? Visualizing peaceful scenes.  Cut back on alcohol, caffeinated beverages, and cigarettes, especially close to bedtime. These can disrupt your sleep.  Do not overeat or eat spicy foods right before bedtime. This can lead to digestive discomfort that can make it hard for you to sleep.  Limit screen use before bedtime. This includes: ? Watching TV. ? Using your smartphone, tablet, and computer.  Stick to a routine. This can help you fall asleep faster. Try to do a quiet activity, brush your teeth, and go to bed at the same time each night.  Get out of bed if you are still awake after 15 minutes of trying to sleep. Keep the lights  down, but try reading or doing a quiet activity. When you feel sleepy, go back to bed.  Make sure that you drive carefully. Avoid driving if you feel very sleepy.  Keep all follow-up appointments as directed by your health care provider. This is important. Contact a health care provider if:  You are tired throughout the day or have trouble in your daily routine due to sleepiness.  You continue to have sleep problems or your sleep problems get worse. Get help right away if:  You have serious thoughts about hurting yourself or someone else. This information is not intended to replace advice given to you by your health care provider. Make sure you discuss any questions you have with your health care provider. Document Released: 05/21/2000 Document Revised: 10/24/2015 Document Reviewed: 02/22/2014 Elsevier Interactive Patient Education  [employer]. 10.

## 2017-01-06 NOTE — Progress Notes (Signed)
Pre visit review using our clinic review tool, if applicable. No additional management support is needed unless otherwise documented below in the visit note. 

## 2017-01-06 NOTE — Progress Notes (Addendum)
Subjective:   Holly Banks is a 83 y.o. female who presents for an Initial Medicare Annual Wellness Visit.  Review of Systems    No ROS.  Medicare Wellness Visit. Additional risk factors are reflected in the social history.   Cardiac Risk Factors include: advanced age (>4men, >9 women);obesity (BMI >30kg/m2);sedentary lifestyle;diabetes mellitus;hypertension Sleep patterns: has frequent nighttime awakenings, has daytime sleepiness, feels rested on waking, gets up 1-2 times nightly to void and sleeps 5-6 hours nightly.  Patient reports insomnia issues, discussed recommended sleep tips and stress reduction tips, education was attached to patient's AVS.   Home Safety/Smoke Alarms: Feels safe in home. Smoke alarms in place.  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 Lives alone, good support system  Seat Belt Safety/Bike Helmet: Wears seat belt.   Counseling:   Eye Exam- Last: Patient reports several years, reports she will call to make an appointment Dental- Dentures  Female:   Pap- N/A      Mammo- Last 09/04/10, BI-RADS category 2: benign      Dexa scan- N/D       CCS- N/A     Objective:    Today's Vitals   01/06/17 1126  BP: 138/84  Pulse: 69  Resp: 20  SpO2: 99%  Weight: 228 lb (103.4 kg)  Height: 5\' 3"  (1.6 m)   Body mass index is 40.39 kg/m.   Current Medications (verified) Outpatient Encounter Prescriptions as of 01/06/2017  Medication Sig  . aspirin 81 MG tablet Take 81 mg by mouth daily.  . Cholecalciferol (EQL VITAMIN D3) 1000 UNITS tablet Take 1,000 Units by mouth daily. For supplement  . hydrOXYzine (ATARAX/VISTARIL) 10 MG tablet TAKE 1 TABLET BY MOUTH THREE TIMES DAILY AS NEEDED FOR ITCHING  . losartan (COZAAR) 50 MG tablet TAKE 1 TABLET EVERY DAY  FOR  HYPERTENSION  . metFORMIN (GLUCOPHAGE) 500 MG tablet TAKE 1 TABLET TWICE DAILY WITH A MEAL FOR DIABETES  .  naproxen sodium (ANAPROX) 220 MG tablet Take 440 mg by mouth as needed.  . potassium chloride (K-DUR,KLOR-CON) 10 MEQ tablet TAKE 1 TABLET EVERY DAY  . repaglinide (PRANDIN) 1 MG tablet 1 tab ac qd-tid. Use Prandin (repaglinide) only if you eat  . verapamil (VERELAN PM) 360 MG 24 hr capsule TAKE 1 CAPSULE AT BEDTIME  FOR  HYPERTENSION  . vitamin B-12 (CYANOCOBALAMIN) 1000 MCG tablet Take 1,000 mcg by mouth daily.    . [DISCONTINUED] ciprofloxacin (CIPRO) 250 MG tablet Take 1 tablet (250 mg total) by mouth 2 (two) times daily. (Patient not taking: Reported on 01/06/2017)  . [DISCONTINUED] potassium chloride (KLOR-CON 10) 10 MEQ tablet Take 1 tablet (10 mEq total) by mouth daily.   No facility-administered encounter medications on file as of 01/06/2017.     Allergies (verified) Ditropan [oxybutynin] and Penicillins   History: Past Medical History:  Diagnosis Date  . Diabetes mellitus    type II  . Diverticulosis   . Gastritis    w/ hx of h. pylori  . GERD (gastroesophageal reflux disease)   . Gout   . Hyperkalemia   . Hypertension   . Leiomyoma 2-02   colonic polypoid   . Morbid obesity ([place])   . Osteoarthritis   . Pain in joint, lower leg   . Unspecified vitamin D deficiency   . Urinary incontinence    Past Surgical History:  Procedure Laterality Date  . CHOLECYSTECTOMY    . LITHOTRIPSY    .  ORIF FEMUR FRACTURE N/A 07/12/2013   Procedure: OPEN REDUCTION INTERNAL FIXATION (ORIF) DISTAL FEMUR FRACTURE;  Surgeon: Hessie Dibble, MD;  Location: [facility];  Service: Orthopedics;  Laterality: N/A;  . right tkr    . thr left    . thr left 3/4    . TOTAL HIP ARTHROPLASTY  2009   right  . TOTAL HIP ARTHROPLASTY     left   Family History  Problem Relation Age of Onset  . Cancer Sister        breast, stomach  . Diabetes Sister   . Diabetes Other    Social History   Occupational History  . Not on file.   Social History Main Topics  . Smoking status: Former Smoker    Quit  date: 06/07/1960  . Smokeless tobacco: Never Used  . Alcohol use No  . Drug use: No  . Sexual activity: No    Tobacco Counseling Counseling given: Not Answered   Activities of Daily Living In your present state of health, do you have any difficulty performing the following activities: 01/06/2017  Hearing? N  Vision? N  Difficulty concentrating or making decisions? N  Walking or climbing stairs? N  Dressing or bathing? N  Doing errands, shopping? Y  Preparing Food and eating ? Y  Using the Toilet? N  In the past six months, have you accidently leaked urine? N  Do you have problems with loss of bowel control? N  Managing your Medications? N  Managing your Finances? N  Housekeeping or managing your Housekeeping? Y  Some recent data might be hidden    Immunizations and Health Maintenance Immunization History  Administered Date(s) Administered  . Influenza Split 04/10/2012  . Influenza Whole 04/06/2006, 03/05/2008, 04/17/2009, 04/03/2010  . Influenza, High Dose Seasonal PF 02/27/2016  . Influenza,inj,Quad PF,36+ Mos 03/15/2013, 02/22/2014, 02/14/2015  . PPD Test 07/17/2013  . Pneumococcal Conjugate-13 03/15/2013  . Pneumococcal Polysaccharide-23 06/08/2004  . Td 04/03/2010   Health Maintenance Due  Topic Date Due  . OPHTHALMOLOGY EXAM  07/28/1938  . FOOT EXAM  12/14/2013  . INFLUENZA VACCINE  01/05/2017    Patient Care Team: Plotnikov, Evie Lacks, MD as PCP - General  Indicate any recent Medical Services you may have received from other than [facility] providers in the past year (date may be approximate).     Assessment:   This is a routine wellness examination for [facility]. Physical assessment deferred to PCP.   Hearing/Vision screen Hearing Screening Comments: Able to hear conversational tones w/o difficulty. No issues reported. Passed whisper test  Vision Screening Comments: Wears glasses  Dietary issues and exercise activities discussed: Current Exercise Habits:  The patient does not participate in regular exercise at present, Exercise limited by: None identified  Diet (meal preparation, eat out, water intake, caffeinated beverages, dairy products, fruits and vegetables): in general, an "unhealthy" diet, on average, 1-2 meals per day, reports eating high sugar snacks, expressed having poor appetite, drinks 1-2 glasses of water daily and several glasses of juice.  Reviewed heart healthy and diabetic diet, encouraged patient to increase daily water intake and to supplement with boost or ensure (coupons provided)   Goals    . I want to start to go church          I will ride with my children to church and continue to read my bible in the morning      Depression Screen PHQ 2/9 Scores 01/06/2017 11/28/2015 11/08/2014  PHQ - 2 Score  2 0 0  PHQ- 9 Score 8 - -    Fall Risk Fall Risk  01/06/2017 11/28/2015 11/08/2014  Falls in the past year? Yes No No  Number falls in past yr: 2 or more - -  Risk for fall due to : Impaired balance/gait;Impaired mobility;Impaired vision - -  Follow up Falls prevention discussed;Education provided - -    Cognitive Function: MMSE - Mini Mental State Exam 01/06/2017  Orientation to time 5  Orientation to Place 5  Registration 3  Attention/ Calculation 4  Recall 1  Language- name 2 objects 2  Language- repeat 1  Language- follow 3 step command 3  Language- read & follow direction 1  Write a sentence 1  Copy design 1  Total score 27        Screening Tests Health Maintenance  Topic Date Due  . OPHTHALMOLOGY EXAM  07/28/1938  . FOOT EXAM  12/14/2013  . INFLUENZA VACCINE  01/05/2017  . HEMOGLOBIN A1C  05/31/2017  . TETANUS/TDAP  04/03/2020  . PNA vac Low Risk Adult  Completed      Plan:    I have personally reviewed and noted the following in the patient's chart:   . Medical and social history . Use of alcohol, tobacco or illicit drugs  . Current medications and supplements . Functional ability and  status . Nutritional status . Physical activity . Advanced directives . List of other physicians . Vitals . Screenings to include cognitive, depression, and falls . Referrals and appointments  In addition, I have reviewed and discussed with patient certain preventive protocols, quality metrics, and best practice recommendations. A written personalized care plan for preventive services as well as general preventive health recommendations were provided to patient.     Michiel Cowboy, RN   01/06/2017    Medical screening examination/treatment/procedure(s) were performed by non-physician practitioner and as supervising physician I was immediately available for consultation/collaboration. I agree with above. [name], MD

## 2017-02-15 ENCOUNTER — Encounter (HOSPITAL_COMMUNITY): Payer: Self-pay

## 2017-02-15 DIAGNOSIS — N183 Chronic kidney disease, stage 3 (moderate): Secondary | ICD-10-CM | POA: Insufficient documentation

## 2017-02-15 DIAGNOSIS — I129 Hypertensive chronic kidney disease with stage 1 through stage 4 chronic kidney disease, or unspecified chronic kidney disease: Secondary | ICD-10-CM | POA: Diagnosis not present

## 2017-02-15 DIAGNOSIS — K567 Ileus, unspecified: Secondary | ICD-10-CM | POA: Diagnosis not present

## 2017-02-15 DIAGNOSIS — R1084 Generalized abdominal pain: Secondary | ICD-10-CM | POA: Insufficient documentation

## 2017-02-15 DIAGNOSIS — Z96643 Presence of artificial hip joint, bilateral: Secondary | ICD-10-CM | POA: Diagnosis not present

## 2017-02-15 DIAGNOSIS — Z7984 Long term (current) use of oral hypoglycemic drugs: Secondary | ICD-10-CM | POA: Insufficient documentation

## 2017-02-15 DIAGNOSIS — Z7982 Long term (current) use of aspirin: Secondary | ICD-10-CM | POA: Insufficient documentation

## 2017-02-15 DIAGNOSIS — Z87891 Personal history of nicotine dependence: Secondary | ICD-10-CM | POA: Insufficient documentation

## 2017-02-15 DIAGNOSIS — E1122 Type 2 diabetes mellitus with diabetic chronic kidney disease: Secondary | ICD-10-CM | POA: Insufficient documentation

## 2017-02-15 DIAGNOSIS — Z79899 Other long term (current) drug therapy: Secondary | ICD-10-CM | POA: Diagnosis not present

## 2017-02-15 NOTE — ED Triage Notes (Signed)
PT arrives to ED POV with complaints of abdominal pain and feeling jittery when standing. PT denies n/v/diaphoresis, chest pain, sob, dizziness. Pt states she feels like abd has pressure in it. Last BM today. PT reports an episode of palpitations this morning when getting up to use walker.

## 2017-02-16 ENCOUNTER — Emergency Department (HOSPITAL_COMMUNITY): Payer: Medicare PPO

## 2017-02-16 ENCOUNTER — Emergency Department (HOSPITAL_COMMUNITY)
Admission: EM | Admit: 2017-02-16 | Discharge: 2017-02-16 | Disposition: A | Discharge status: Home / Self Care | Payer: Medicare PPO | Attending: Emergency Medicine | Admitting: Emergency Medicine

## 2017-02-16 DIAGNOSIS — K567 Ileus, unspecified: Secondary | ICD-10-CM

## 2017-02-16 DIAGNOSIS — R1084 Generalized abdominal pain: Secondary | ICD-10-CM

## 2017-02-16 LAB — COMPREHENSIVE METABOLIC PANEL
ALBUMIN: 3.6 g/dL (ref 3.5–5.0)
ALK PHOS: 51 U/L (ref 38–126)
ALT: 12 U/L — AB (ref 14–54)
AST: 19 U/L (ref 15–41)
Anion gap: 8 (ref 5–15)
BILIRUBIN TOTAL: 1.1 mg/dL (ref 0.3–1.2)
BUN: 20 mg/dL (ref 6–20)
CALCIUM: 9.6 mg/dL (ref 8.9–10.3)
CO2: 23 mmol/L (ref 22–32)
CREATININE: 0.9 mg/dL (ref 0.44–1.00)
Chloride: 106 mmol/L (ref 101–111)
GFR calc non Af Amer: 56 mL/min — ABNORMAL LOW (ref >60)
GLUCOSE: 156 mg/dL — AB (ref 65–99)
Potassium: 4.3 mmol/L (ref 3.5–5.1)
Sodium: 137 mmol/L (ref 135–145)
TOTAL PROTEIN: 7.2 g/dL (ref 6.5–8.1)

## 2017-02-16 LAB — URINALYSIS, ROUTINE W REFLEX MICROSCOPIC
Bilirubin Urine: NEGATIVE
GLUCOSE, UA: NEGATIVE mg/dL
Hgb urine dipstick: NEGATIVE
KETONES UR: 5 mg/dL — AB
NITRITE: NEGATIVE
PH: 5 (ref 5.0–8.0)
Protein, ur: 30 mg/dL — AB
SPECIFIC GRAVITY, URINE: 1.024 (ref 1.005–1.030)

## 2017-02-16 LAB — CBC
HCT: 35.4 % — ABNORMAL LOW (ref 36.0–46.0)
HEMOGLOBIN: 11.3 g/dL — AB (ref 12.0–15.0)
MCH: 25.6 pg — ABNORMAL LOW (ref 26.0–34.0)
MCHC: 31.9 g/dL (ref 30.0–36.0)
MCV: 80.1 fL (ref 78.0–100.0)
PLATELETS: 245 10*3/uL (ref 150–400)
RBC: 4.42 MIL/uL (ref 3.87–5.11)
RDW: 13.3 % (ref 11.5–15.5)
WBC: 7.4 10*3/uL (ref 4.0–10.5)

## 2017-02-16 LAB — LIPASE, BLOOD: Lipase: 24 U/L (ref 11–51)

## 2017-02-16 MED ORDER — IOPAMIDOL (ISOVUE-300) INJECTION 61%
INTRAVENOUS | Status: AC
  Administered 2017-02-16: 100 mL
  Filled 2017-02-16: qty 100

## 2017-02-16 NOTE — ED Notes (Signed)
Pts family very displeased with visit today; states she will never return to this hospital; RN attempted to reconcile situation however ineffective; RN attempted to explain why there were 20 patients holding in the ER waiting for IP rooms but family reported explaination didn't make sense; RN kept quiet the rest of the time while she was being blessed out by family

## 2017-02-16 NOTE — Discharge Instructions (Signed)
Magnesium citrate: Drink two thirds of the 10 ounce bottle mixed with equal parts Sprite or Gatorade.  Return to the emergency department for worsening pain, high fevers, bloody stools, or other new and concerning symptoms.

## 2017-02-16 NOTE — ED Provider Notes (Signed)
[facility] DEPT Provider Note   CSN: [id number] Arrival date & time: 02/15/17  2137     History   Chief Complaint Chief Complaint  Patient presents with  . Abdominal Pain    HPI Holly Banks is a 83 y.o. female.  Patient is an 83 year old female with past medical history of diabetes, hypertension, obesity. She presents today for evaluation of abdominal pain. This is been ongoing for the past week. It is worse when she eats. She denies any vomiting but does report constipation alternating with diarrhea. There is no hematemesis or hematochezia. Denies any fevers or chills.   The history is provided by the patient.  Abdominal Pain   This is a new problem. Episode onset: 1 week ago. The problem occurs constantly. The problem has been gradually worsening. The pain is associated with eating. The pain is located in the generalized abdominal region. The quality of the pain is cramping. The pain is moderate. Associated symptoms include diarrhea and constipation. Pertinent negatives include flatus, hematochezia and melena. The symptoms are aggravated by eating. Nothing relieves the symptoms.    Past Medical History:  Diagnosis Date  . Diabetes mellitus    type II  . Diverticulosis   . Gastritis    w/ hx of h. pylori  . GERD (gastroesophageal reflux disease)   . Gout   . Hyperkalemia   . Hypertension   . Leiomyoma 2-02   colonic polypoid   . Morbid obesity ([place])   . Osteoarthritis   . Pain in joint, lower leg   . Unspecified vitamin D deficiency   . Urinary incontinence     Patient Active Problem List   Diagnosis Date Noted  . Chills (without fever) 08/23/2016  . Neck pain 05/24/2016  . Ganglion cyst of flexor tendon sheath of finger 08/22/2015  . Paresthesia 11/16/2013  . Acute blood loss anemia 07/19/2013  . Fracture of femur, right, closed ([place]) 07/11/2013  . URI (upper respiratory infection) 06/15/2013  . Cough 03/15/2013  . Vision loss, right eye 01/05/2012    . UTI (lower urinary tract infection) 03/31/2011  . Rash 11/05/2010  . SHOULDER PAIN 04/17/2009  . TOBACCO USE, QUIT 04/17/2009  . GASTRITIS 12/25/2008  . ABNORMAL EXAM-BILIARY TRACT 10/16/2008  . GASTROINTESTINAL XRAY, ABNORMAL 10/16/2008  . ANAL PRURITUS 09/17/2008  . Edema 09/17/2008  . OTHER DYSPHAGIA 09/17/2008  . HAND PAIN 09/07/2007  . Vitamin B 12 deficiency 07/05/2007  . PARESTHESIA 07/05/2007  . TINNITUS NOS 03/23/2007  . URINARY INCONTINENCE 03/21/2007  . DM type 2 causing CKD stage 3 ([place]) 01/16/2007  . Vitamin D deficiency 01/16/2007  . Gout 01/16/2007  . HYPERKALEMIA 01/16/2007  . OBESITY, MORBID 01/16/2007  . Essential hypertension 01/16/2007  . GERD 01/16/2007  . Osteoarthritis 01/16/2007  . SYMPTOM, INCONTINENCE, URINARY NEC 01/16/2007  . COLONIC POLYPS, HX OF 01/16/2007    Past Surgical History:  Procedure Laterality Date  . CHOLECYSTECTOMY    . LITHOTRIPSY    . ORIF FEMUR FRACTURE N/A 07/12/2013   Procedure: OPEN REDUCTION INTERNAL FIXATION (ORIF) DISTAL FEMUR FRACTURE;  Surgeon: Hessie Dibble, MD;  Location: [facility];  Service: Orthopedics;  Laterality: N/A;  . right tkr    . thr left    . thr left 3/4    . TOTAL HIP ARTHROPLASTY  2009   right  . TOTAL HIP ARTHROPLASTY     left    OB History    No data available       Home Medications  Prior to Admission medications   Medication Sig Start Date End Date Taking? Authorizing Provider  aspirin 81 MG tablet Take 81 mg by mouth daily. 08/11/13   [provider]  Cholecalciferol (EQL VITAMIN D3) 1000 UNITS tablet Take 1,000 Units by mouth daily. For supplement    [provider]  hydrOXYzine (ATARAX/VISTARIL) 10 MG tablet TAKE 1 TABLET BY MOUTH THREE TIMES DAILY AS NEEDED FOR ITCHING 08/23/16   Plotnikov, Evie Lacks, MD  losartan (COZAAR) 50 MG tablet TAKE 1 TABLET EVERY DAY  FOR  HYPERTENSION 08/12/16   Plotnikov, Evie Lacks, MD  metFORMIN (GLUCOPHAGE) 500 MG tablet TAKE 1 TABLET  TWICE DAILY WITH A MEAL FOR DIABETES 08/12/16   Plotnikov, Evie Lacks, MD  naproxen sodium (ANAPROX) 220 MG tablet Take 440 mg by mouth as needed.    [provider]  potassium chloride (K-DUR,KLOR-CON) 10 MEQ tablet TAKE 1 TABLET EVERY DAY 12/20/16   Plotnikov, Evie Lacks, MD  repaglinide (PRANDIN) 1 MG tablet 1 tab ac qd-tid. Use Prandin (repaglinide) only if you eat 12/01/16   Plotnikov, Evie Lacks, MD  verapamil (VERELAN PM) 360 MG 24 hr capsule TAKE 1 CAPSULE AT BEDTIME  FOR  HYPERTENSION 08/12/16   Plotnikov, Evie Lacks, MD  vitamin B-12 (CYANOCOBALAMIN) 1000 MCG tablet Take 1,000 mcg by mouth daily.      [provider]    Family History Family History  Problem Relation Age of Onset  . Cancer Sister        breast, stomach  . Diabetes Sister   . Diabetes Other     Social History Social History  Substance Use Topics  . Smoking status: Former Smoker    Quit date: 06/07/1960  . Smokeless tobacco: Never Used  . Alcohol use No     Allergies   Ditropan [oxybutynin] and Penicillins   Review of Systems Review of Systems  Gastrointestinal: Positive for abdominal pain, constipation and diarrhea. Negative for flatus, hematochezia and melena.  All other systems reviewed and are negative.    Physical Exam Updated Vital Signs BP 140/69 (BP Location: Right Arm)   Pulse 88   Temp 99.5 F (37.5 C) (Oral)   Resp 18   Ht 5\' 2"  (1.575 m)   Wt 100.7 kg (222 lb)   SpO2 99%   BMI 40.60 kg/m   Physical Exam  Constitutional: She is oriented to person, place, and time. She appears well-developed and well-nourished. No distress.  HENT:  Head: Normocephalic and atraumatic.  Neck: Normal range of motion. Neck supple.  Cardiovascular: Normal rate and regular rhythm.  Exam reveals no gallop and no friction rub.   No murmur heard. Pulmonary/Chest: Effort normal and breath sounds normal. No respiratory distress. She has no wheezes.  Abdominal: Soft. Bowel sounds are normal. She  exhibits no distension. There is tenderness. There is no rebound and no guarding.  There is tenderness to palpation that is diffuse throughout the abdomen.  Musculoskeletal: Normal range of motion.  Neurological: She is alert and oriented to person, place, and time.  Skin: Skin is warm and dry. She is not diaphoretic.  Nursing note and vitals reviewed.    ED Treatments / Results  Labs (all labs ordered are listed, but only abnormal results are displayed) Labs Reviewed  COMPREHENSIVE METABOLIC PANEL - Abnormal; Notable for the following:       Result Value   Glucose, Bld 156 (*)    ALT 12 (*)    GFR calc non Af Amer 56 (*)  All other components within normal limits  CBC - Abnormal; Notable for the following:    Hemoglobin 11.3 (*)    HCT 35.4 (*)    MCH 25.6 (*)    All other components within normal limits  LIPASE, BLOOD  URINALYSIS, ROUTINE W REFLEX MICROSCOPIC    EKG  EKG Interpretation None       Radiology No results found.  Procedures Procedures (including critical care time)  Medications Ordered in ED Medications - No data to display   Initial Impression / Assessment and Plan / ED Course  I have reviewed the triage vital signs and the nursing notes.  Pertinent labs & imaging results that were available during my care of the patient were reviewed by me and considered in my medical decision making (see chart for details).  CT scan shows an area of ileus, however no overt obstruction or other surgical emergency. She has no Banks count, electrolytes and lipase are normal, and I see no indication for admission or surgical consultation. I will recommend magnesium citrate and return as needed if she worsens.  Final Clinical Impressions(s) / ED Diagnoses   Final diagnoses:  None    New Prescriptions New Prescriptions   No medications on file     Veryl Speak, MD 02/16/17 [phone]

## 2017-02-17 ENCOUNTER — Telehealth: Payer: Self-pay

## 2017-02-17 NOTE — Telephone Encounter (Signed)
Spoke to daughter, she would like a follow up appointment as it has been awhile since she has been seen. Offered her a sooner appointment with APP but they would like to see Dr. Havery Moros as she is a former Dr. Olevia Perches pt. Appointment scheduled for 04/05/17.

## 2017-02-21 ENCOUNTER — Other Ambulatory Visit: Payer: Self-pay | Admitting: Internal Medicine

## 2017-03-04 ENCOUNTER — Ambulatory Visit: Payer: Medicare PPO | Admitting: Internal Medicine

## 2017-04-01 ENCOUNTER — Other Ambulatory Visit (INDEPENDENT_AMBULATORY_CARE_PROVIDER_SITE_OTHER): Payer: Medicare PPO

## 2017-04-01 DIAGNOSIS — I10 Essential (primary) hypertension: Secondary | ICD-10-CM

## 2017-04-01 DIAGNOSIS — E119 Type 2 diabetes mellitus without complications: Secondary | ICD-10-CM | POA: Diagnosis not present

## 2017-04-01 DIAGNOSIS — E539 Vitamin B deficiency, unspecified: Secondary | ICD-10-CM | POA: Diagnosis not present

## 2017-04-01 LAB — BASIC METABOLIC PANEL
BUN: 13 mg/dL (ref 6–23)
CALCIUM: 9.6 mg/dL (ref 8.4–10.5)
CO2: 30 mEq/L (ref 19–32)
Chloride: 106 mEq/L (ref 96–112)
Creatinine, Ser: 0.69 mg/dL (ref 0.40–1.20)
GFR: 103.58 mL/min (ref >60.00)
GLUCOSE: 153 mg/dL — AB (ref 70–99)
POTASSIUM: 3.7 meq/L (ref 3.5–5.1)
SODIUM: 143 meq/L (ref 135–145)

## 2017-04-01 LAB — HEMOGLOBIN A1C: HEMOGLOBIN A1C: 5.8 % (ref 4.6–6.5)

## 2017-04-05 ENCOUNTER — Encounter: Payer: Self-pay | Admitting: Internal Medicine

## 2017-04-05 ENCOUNTER — Other Ambulatory Visit (INDEPENDENT_AMBULATORY_CARE_PROVIDER_SITE_OTHER): Payer: Medicare PPO

## 2017-04-05 ENCOUNTER — Ambulatory Visit: Payer: Medicare PPO | Admitting: Gastroenterology

## 2017-04-05 ENCOUNTER — Ambulatory Visit (INDEPENDENT_AMBULATORY_CARE_PROVIDER_SITE_OTHER): Payer: Medicare PPO | Admitting: Internal Medicine

## 2017-04-05 VITALS — BP 144/86 | HR 69 | Temp 98.9°F | Ht 62.0 in | Wt 225.0 lb

## 2017-04-05 DIAGNOSIS — Z23 Encounter for immunization: Secondary | ICD-10-CM

## 2017-04-05 DIAGNOSIS — N183 Chronic kidney disease, stage 3 unspecified: Secondary | ICD-10-CM

## 2017-04-05 DIAGNOSIS — E538 Deficiency of other specified B group vitamins: Secondary | ICD-10-CM | POA: Diagnosis not present

## 2017-04-05 DIAGNOSIS — R531 Weakness: Secondary | ICD-10-CM | POA: Diagnosis not present

## 2017-04-05 DIAGNOSIS — E1122 Type 2 diabetes mellitus with diabetic chronic kidney disease: Secondary | ICD-10-CM

## 2017-04-05 DIAGNOSIS — D649 Anemia, unspecified: Secondary | ICD-10-CM

## 2017-04-05 DIAGNOSIS — I10 Essential (primary) hypertension: Secondary | ICD-10-CM

## 2017-04-05 LAB — IRON,TIBC AND FERRITIN PANEL
%SAT: 27 % (ref 11–50)
Ferritin: 58 ng/mL (ref 20–288)
IRON: 82 ug/dL (ref 45–160)
TIBC: 299 ug/dL (ref 250–450)

## 2017-04-05 LAB — CBC WITH DIFFERENTIAL/PLATELET
BASOS ABS: 0 10*3/uL (ref 0.0–0.1)
Basophils Relative: 0.8 % (ref 0.0–3.0)
EOS ABS: 0.1 10*3/uL (ref 0.0–0.7)
Eosinophils Relative: 1.1 % (ref 0.0–5.0)
HEMATOCRIT: 34.5 % — AB (ref 36.0–46.0)
HEMOGLOBIN: 10.8 g/dL — AB (ref 12.0–15.0)
LYMPHS PCT: 34.2 % (ref 12.0–46.0)
Lymphs Abs: 2.2 10*3/uL (ref 0.7–4.0)
MCHC: 31.3 g/dL (ref 30.0–36.0)
MCV: 84 fl (ref 78.0–100.0)
Monocytes Absolute: 0.6 10*3/uL (ref 0.1–1.0)
Monocytes Relative: 8.6 % (ref 3.0–12.0)
Neutro Abs: 3.6 10*3/uL (ref 1.4–7.7)
Neutrophils Relative %: 55.3 % (ref 43.0–77.0)
Platelets: 236 10*3/uL (ref 150.0–400.0)
RBC: 4.1 Mil/uL (ref 3.87–5.11)
RDW: 14.2 % (ref 11.5–15.5)
WBC: 6.5 10*3/uL (ref 4.0–10.5)

## 2017-04-05 NOTE — Assessment & Plan Note (Signed)
On B12 

## 2017-04-05 NOTE — Addendum Note (Signed)
Addended by: Karren Cobble on: 04/05/2017 04:29 PM   Modules accepted: Orders

## 2017-04-05 NOTE — Assessment & Plan Note (Signed)
Losartan, Verapamil

## 2017-04-05 NOTE — Assessment & Plan Note (Addendum)
Labs - CBC, iron

## 2017-04-05 NOTE — Progress Notes (Signed)
Subjective:  Patient ID: Holly Banks, female    DOB: 11/18/1928  Age: 83 y.o. MRN: [id number]  CC: No chief complaint on file.   HPI Holly Banks presents for DM, HTN, OA  C/o shaking when standing, walking; better w/sitting down - weak x weeks F/u abd pain - ER visit - no relapse...  Outpatient Medications Prior to Visit  Medication Sig Dispense Refill  . aspirin 81 MG tablet Take 81 mg by mouth daily.    . Cholecalciferol (EQL VITAMIN D3) 1000 UNITS tablet Take 1,000 Units by mouth daily. For supplement    . hydrOXYzine (ATARAX/VISTARIL) 10 MG tablet TAKE 1 TABLET BY MOUTH THREE TIMES DAILY AS NEEDED FOR ITCHING 60 tablet 4  . losartan (COZAAR) 50 MG tablet TAKE 1 TABLET EVERY DAY  FOR  HYPERTENSION 90 tablet 1  . metFORMIN (GLUCOPHAGE) 500 MG tablet TAKE 1 TABLET TWICE DAILY WITH A MEAL FOR DIABETES 180 tablet 1  . naproxen sodium (ANAPROX) 220 MG tablet Take 440 mg by mouth as needed.    . potassium chloride (K-DUR,KLOR-CON) 10 MEQ tablet TAKE 1 TABLET EVERY DAY 90 tablet 1  . repaglinide (PRANDIN) 1 MG tablet 1 tab ac qd-tid. Use Prandin (repaglinide) only if you eat 90 tablet 11  . verapamil (VERELAN PM) 360 MG 24 hr capsule TAKE 1 CAPSULE AT BEDTIME  FOR  HYPERTENSION 90 capsule 1  . vitamin B-12 (CYANOCOBALAMIN) 1000 MCG tablet Take 1,000 mcg by mouth daily.       No facility-administered medications prior to visit.     ROS Review of Systems  Constitutional: Positive for fatigue. Negative for activity change, appetite change, chills and unexpected weight change.  HENT: Negative for congestion, mouth sores and sinus pressure.   Eyes: Negative for visual disturbance.  Respiratory: Negative for cough and chest tightness.   Gastrointestinal: Negative for abdominal pain and nausea.  Genitourinary: Negative for difficulty urinating, frequency and vaginal pain.  Musculoskeletal: Positive for arthralgias and back pain. Negative for gait problem.  Skin: Negative for  pallor and rash.  Neurological: Positive for tremors. Negative for dizziness, weakness, numbness and headaches.  Psychiatric/Behavioral: Negative for confusion and sleep disturbance.    Objective:  BP (!) 144/86 (BP Location: Left Arm, Patient Position: Sitting, Cuff Size: Large)   Pulse 69   Temp 98.9 F (37.2 C) (Oral)   Ht 5\' 2"  (1.575 m)   Wt 225 lb (102.1 kg)   SpO2 100%   BMI 41.15 kg/m   BP Readings from Last 3 Encounters:  04/05/17 (!) 144/86  02/16/17 (!) 153/82  01/06/17 138/84    Wt Readings from Last 3 Encounters:  04/05/17 225 lb (102.1 kg)  02/15/17 222 lb (100.7 kg)  01/06/17 228 lb (103.4 kg)    Physical Exam  Constitutional: She appears well-developed. No distress.  HENT:  Head: Normocephalic.  Right Ear: External ear normal.  Left Ear: External ear normal.  Nose: Nose normal.  Mouth/Throat: Oropharynx is clear and moist.  Eyes: Pupils are equal, round, and reactive to light. Conjunctivae are normal. Right eye exhibits no discharge. Left eye exhibits no discharge.  Neck: Normal range of motion. Neck supple. No JVD present. No tracheal deviation present. No thyromegaly present.  Cardiovascular: Normal rate, regular rhythm and normal heart sounds.   Pulmonary/Chest: No stridor. No respiratory distress. She has no wheezes.  Abdominal: Soft. Bowel sounds are normal. She exhibits no distension and no mass. There is no tenderness. There is no rebound and no guarding.  Musculoskeletal: She exhibits tenderness. She exhibits no edema.  Lymphadenopathy:    She has no cervical adenopathy.  Neurological: She displays normal reflexes. No cranial nerve deficit. She exhibits normal muscle tone. Coordination abnormal.  Skin: No rash noted. No erythema.  Psychiatric: She has a normal mood and affect. Her behavior is normal. Judgment and thought content normal.  walker obese No tremor  Lab Results  Component Value Date   WBC 7.4 02/15/2017   HGB 11.3 (L)  02/15/2017   HCT 35.4 (L) 02/15/2017   PLT 245 02/15/2017   GLUCOSE 153 (H) 04/01/2017   CHOL 198 05/04/2011   TRIG 116.0 05/04/2011   HDL 62.30 05/04/2011   LDLDIRECT 126.1 03/31/2010   LDLCALC 113 (H) 05/04/2011   ALT 12 (L) 02/15/2017   AST 19 02/15/2017   NA 143 04/01/2017   K 3.7 04/01/2017   CL 106 04/01/2017   CREATININE 0.69 04/01/2017   BUN 13 04/01/2017   CO2 30 04/01/2017   TSH 3.00 08/23/2016   INR 2.2 (H) 10/25/2007   HGBA1C 5.8 04/01/2017   MICROALBUR 11.9 (H) 08/23/2016    Ct Abdomen Pelvis W Contrast  Result Date: 02/16/2017 CLINICAL DATA:  Unspecified abdominal pain. EXAM: CT ABDOMEN AND PELVIS WITH CONTRAST TECHNIQUE: Multidetector CT imaging of the abdomen and pelvis was performed using the standard protocol following bolus administration of intravenous contrast. CONTRAST:  118mL ISOVUE-300 IOPAMIDOL (ISOVUE-300) INJECTION 61% COMPARISON:  None. FINDINGS: Lower chest: Scattered atelectasis at the lung bases. No pleural fluid. Heart is upper normal in size. Hepatobiliary: No focal liver abnormality is seen. Status post cholecystectomy. No biliary dilatation. Pancreas: No ductal dilatation or inflammation. Spleen: Normal in size without focal abnormality. Adrenals/Urinary Tract: Normal adrenal glands. No hydronephrosis or perinephric edema. Symmetric renal excretion on delayed phase imaging. 2.1 cm low-density lesion in the upper left kidney is incompletely characterized but likely cyst. Urinary bladder is near completely decompressed, partially obscured by streak artifact from bilateral hip arthroplasties. Stomach/Bowel: The stomach is decompressed. Proximal small bowel is nondilated. Lower pelvic small bowel loops are fluid-filled with scattered air-fluid levels and prominent size measuring up tip 3 cm. Mild fecalization of small bowel contents and pelvic bowel loops. No discrete transition point. Normal appendix. Moderate colonic diverticulosis throughout the colon. No  acute diverticulitis. Vascular/Lymphatic: Aortic atherosclerosis and tortuosity. No abdominopelvic adenopathy. Reproductive: Multiple calcified uterine fibroids. No evidence for adnexal mass allowing for streak artifact. Other: Generalized mesenteric edema without focality. No ascites or free air. No intra-abdominal abscess. Musculoskeletal: Bilateral hip arthroplasties. Multilevel degenerative change in the spine. Generalized fatty atrophy of intra-abdominal musculature. IMPRESSION: 1. Prominent pelvic small bowel loops with air-fluid levels and fecalization of small bowel contents. No discrete transition point. Findings suggest slow transit/ileus. Early/developing small bowel obstruction could have a similar appearance, consider radiographic follow-up. 2. Colonic diverticulosis without acute inflammation. 3. Multiple calcified uterine fibroids. 4.  Aortic Atherosclerosis (ICD10-I70.0). Electronically Signed   By: Jeb Levering M.D.   On: 02/16/2017 04:58    Assessment & Plan:   There are no diagnoses linked to this encounter. I am having Ms. Weckwerth maintain her vitamin B-12, Cholecalciferol, aspirin, metFORMIN, hydrOXYzine, repaglinide, potassium chloride, naproxen sodium, losartan, and verapamil.  No orders of the defined types were placed in this encounter.    Follow-up: No Follow-up on file.  [name], MD

## 2017-04-05 NOTE — Assessment & Plan Note (Signed)
Metformin, Prandin 

## 2017-04-11 LAB — HM DIABETES EYE EXAM

## 2017-05-03 ENCOUNTER — Encounter: Payer: Self-pay | Admitting: Physician Assistant

## 2017-05-04 ENCOUNTER — Encounter: Payer: Self-pay | Admitting: Internal Medicine

## 2017-05-04 NOTE — Progress Notes (Signed)
Abstracted and sent to scan  

## 2017-05-10 ENCOUNTER — Telehealth: Payer: Self-pay | Admitting: Internal Medicine

## 2017-05-10 NOTE — Telephone Encounter (Signed)
Copied from [place]. Topic: Inquiry >> May 10, 2017  1:21 PM Neva Seat wrote: Pt's daughter called Johny Blamer - [phone] cell / [phone] home  Is requesting pt get pt approved for Ensure Plus - Vanilla or Strawberry  Pt is not eating and needs nurishment.

## 2017-05-10 NOTE — Telephone Encounter (Signed)
Left message on daughter's phone. Need more information on pt.'s condition. Stated she wasn't eating well.

## 2017-05-11 ENCOUNTER — Encounter: Payer: Self-pay | Admitting: Physician Assistant

## 2017-05-11 ENCOUNTER — Ambulatory Visit: Payer: Self-pay | Admitting: *Deleted

## 2017-05-11 ENCOUNTER — Ambulatory Visit: Payer: Medicare PPO | Admitting: Physician Assistant

## 2017-05-11 ENCOUNTER — Ambulatory Visit: Payer: Medicare PPO | Admitting: Family

## 2017-05-11 ENCOUNTER — Encounter: Payer: Self-pay | Admitting: Family

## 2017-05-11 ENCOUNTER — Ambulatory Visit (INDEPENDENT_AMBULATORY_CARE_PROVIDER_SITE_OTHER)
Admission: RE | Admit: 2017-05-11 | Discharge: 2017-05-11 | Disposition: A | Discharge status: Home / Self Care | Payer: Medicare PPO | Source: Ambulatory Visit | Attending: Physician Assistant | Admitting: Physician Assistant

## 2017-05-11 ENCOUNTER — Telehealth: Payer: Self-pay | Admitting: Internal Medicine

## 2017-05-11 ENCOUNTER — Other Ambulatory Visit: Payer: Self-pay | Admitting: Family

## 2017-05-11 VITALS — BP 178/92 | HR 68 | Ht 62.0 in | Wt 214.0 lb

## 2017-05-11 VITALS — BP 160/84 | HR 78 | Ht 62.0 in | Wt 214.0 lb

## 2017-05-11 DIAGNOSIS — N183 Chronic kidney disease, stage 3 unspecified: Secondary | ICD-10-CM

## 2017-05-11 DIAGNOSIS — M545 Low back pain, unspecified: Secondary | ICD-10-CM

## 2017-05-11 DIAGNOSIS — E1122 Type 2 diabetes mellitus with diabetic chronic kidney disease: Secondary | ICD-10-CM | POA: Diagnosis not present

## 2017-05-11 DIAGNOSIS — I10 Essential (primary) hypertension: Secondary | ICD-10-CM

## 2017-05-11 MED ORDER — MELOXICAM 7.5 MG PO TABS: 7.5000 mg | ORAL_TABLET | Freq: Every day | ORAL | 0 refills | Status: DC

## 2017-05-11 MED ORDER — POTASSIUM CHLORIDE CRYS ER 10 MEQ PO TBCR: 10.0000 meq | EXTENDED_RELEASE_TABLET | Freq: Every day | ORAL | 1 refills | Status: DC

## 2017-05-11 MED ORDER — METFORMIN HCL 500 MG PO TABS: ORAL_TABLET | ORAL | 1 refills | Status: DC

## 2017-05-11 NOTE — Progress Notes (Signed)
Subjective:    Patient ID: Holly Banks, female    DOB: [birth year], 83 y.o.   MRN: [id number]  HPI Holly Banks is an 83 year old African-American female, known to Dr. Alain Marion and known previously to Dr. Delfin Edis who is brought into the office today after her daughter called apparently with concerns of possible bowel obstruction. Patient comes in alone today, she has absolutely no GI complaints other than intermittent constipation.  She is much more concerned about need for refills on her usual medications and feeling poorly after 2 recent falls.  She says she fell at home about 2 weeks ago when she slipped out of bed, she landed on her right side and right shoulder and has had some lower back pain across the lower back ever since.  She says she is been shaky and nervous since then and just does not feel right.  She says her appetite is been poor for a long time and says that her taste buds do not work right.  She does eat just does not get hungry frequently and has been maintaining her weight. She says her bowels are moving fairly regularly at this point she alternates somewhat between constipation and diarrhea and has MiraLAX to use at home if needed. Last colonoscopy 2011 per Dr. Olevia Perches mild diverticulosis throughout the colon and a 1 cm sessile polyp was removed which was a tubular adenoma. Patient had CT in September 2018 for complaints of abdominal pain which did show some prominent pelvic small bowel loops with air-fluid levels and some of equalization of small bowel contents there was felt to be slow transit versus ileus but no transition point also noted multiple uterine fibroids calcified.  Review of Systems Pertinent positive and negative review of systems were noted in the above HPI section.  All other review of systems was otherwise negative.  Outpatient Encounter Medications as of 05/11/2017  Medication Sig  . aspirin 81 MG tablet Take 81 mg by mouth daily.  . Cholecalciferol  (EQL VITAMIN D3) 1000 UNITS tablet Take 1,000 Units by mouth daily. For supplement  . losartan (COZAAR) 50 MG tablet TAKE 1 TABLET EVERY DAY  FOR  HYPERTENSION  . naproxen sodium (ANAPROX) 220 MG tablet Take 440 mg by mouth as needed.  . repaglinide (PRANDIN) 1 MG tablet 1 tab ac qd-tid. Use Prandin (repaglinide) only if you eat  . verapamil (VERELAN PM) 360 MG 24 hr capsule TAKE 1 CAPSULE AT BEDTIME  FOR  HYPERTENSION  . vitamin B-12 (CYANOCOBALAMIN) 1000 MCG tablet Take 1,000 mcg by mouth daily.    . [DISCONTINUED] metFORMIN (GLUCOPHAGE) 500 MG tablet TAKE 1 TABLET TWICE DAILY WITH A MEAL FOR DIABETES  . [DISCONTINUED] potassium chloride (K-DUR,KLOR-CON) 10 MEQ tablet TAKE 1 TABLET EVERY DAY  . [DISCONTINUED] hydrOXYzine (ATARAX/VISTARIL) 10 MG tablet TAKE 1 TABLET BY MOUTH THREE TIMES DAILY AS NEEDED FOR ITCHING   No facility-administered encounter medications on file as of 05/11/2017.    Allergies  Allergen Reactions  . Ditropan [Oxybutynin]     Dry mouth  . Penicillins     Rash   Patient Active Problem List   Diagnosis Date Noted  . Weakness 04/05/2017  . Chills (without fever) 08/23/2016  . Neck pain 05/24/2016  . Ganglion cyst of flexor tendon sheath of finger 08/22/2015  . Paresthesia 11/16/2013  . Acute blood loss anemia 07/19/2013  . Fracture of femur, right, closed ([place]) 07/11/2013  . URI (upper respiratory infection) 06/15/2013  . Cough 03/15/2013  . Vision loss,  right eye 01/05/2012  . UTI (lower urinary tract infection) 03/31/2011  . Rash 11/05/2010  . SHOULDER PAIN 04/17/2009  . TOBACCO USE, QUIT 04/17/2009  . GASTRITIS 12/25/2008  . ABNORMAL EXAM-BILIARY TRACT 10/16/2008  . GASTROINTESTINAL XRAY, ABNORMAL 10/16/2008  . ANAL PRURITUS 09/17/2008  . Edema 09/17/2008  . OTHER DYSPHAGIA 09/17/2008  . HAND PAIN 09/07/2007  . Vitamin B 12 deficiency 07/05/2007  . PARESTHESIA 07/05/2007  . TINNITUS NOS 03/23/2007  . URINARY INCONTINENCE 03/21/2007  . DM type 2  causing CKD stage 3 ([place]) 01/16/2007  . Vitamin D deficiency 01/16/2007  . Gout 01/16/2007  . HYPERKALEMIA 01/16/2007  . OBESITY, MORBID 01/16/2007  . Essential hypertension 01/16/2007  . GERD 01/16/2007  . Osteoarthritis 01/16/2007  . SYMPTOM, INCONTINENCE, URINARY NEC 01/16/2007  . COLONIC POLYPS, HX OF 01/16/2007   Social History   Socioeconomic History  . Marital status: Widowed    Spouse name: Not on file  . Number of children: Not on file  . Years of education: Not on file  . Highest education level: Not on file  Social Needs  . Financial resource strain: Not on file  . Food insecurity - worry: Not on file  . Food insecurity - inability: Not on file  . Transportation needs - medical: Not on file  . Transportation needs - non-medical: Not on file  Occupational History  . Not on file  Tobacco Use  . Smoking status: Former Smoker    Last attempt to quit: 06/07/1960    Years since quitting: 56.9  . Smokeless tobacco: Never Used  Substance and Sexual Activity  . Alcohol use: No  . Drug use: No  . Sexual activity: No  Other Topics Concern  . Not on file  Social History Narrative  . Not on file    Holly Banks's family history includes Cancer in her sister; Diabetes in her other and sister.      Objective:    Vitals:   05/11/17 1025  BP: (!) 160/84  Pulse: 78    Physical Exam well-developed elderly African-American female in no acute distress, she ambulates with a walker.  Blood pressure 160/84 pulse 78, height 5 foot 2, weight 214, BMI 39.1.  HEENT; nontraumatic normocephalic EOMI PERRLA sclera anicteric, Cardiovascular; regular rate and rhythm with S1-S2 no murmur rub or gallop, Pulmonary; clear bilaterally, Abdomen soft nontender nondistended bowel sounds are active there is no palpable mass or hepatosplenomegaly, Rectal; exam not done, Back; patient has tenderness along the lower back in the upper lumbar region, Neuro psych; mood and affect appropriate.         Assessment & Plan:   #8 83 year old African-American female with no current GI complaints.  She does have intermittent constipation. Patient had a CT scan done in September 2018 for abdominal pain which showed prominent loops of small bowel in the pelvis and slow transit but no obstruction. 2.  Diverticulosis 3.  History of tubular adenomatous colon polyps-last colonoscopy 2011-no follow-up plan due to age 39.  Acute lumbar back pain post fall at home 2 weeks ago rule out possible compression fracture 5.  Diabetes mellitus 6.  Hypertension 7.  GERD  Plan; Patient needs to see her primary care provider and I think she was confused about which providers office she was at today. No further GI intervention needed at this time, she will continue MiraLAX as needed We will check lumbar spine films We have made her an appointment to see primary care later today. Patient will  be established with Dr. Ancil Linsey PA-C 05/11/2017   Cc: Plotnikov, Evie Lacks, MD

## 2017-05-11 NOTE — Telephone Encounter (Signed)
Daughter called in wanting to give information regarding her mother (the pt).   She is not on the DPR.  She did give me the number to call her mother.   I did not get an answer.   Her voicemail was full.

## 2017-05-11 NOTE — Progress Notes (Signed)
Holly Banks is a 83 y.o. female with the following history as recorded in Epic[employer]:  Patient Active Problem List   Diagnosis Date Noted  . Weakness 04/05/2017  . Chills (without fever) 08/23/2016  . Neck pain 05/24/2016  . Ganglion cyst of flexor tendon sheath of finger 08/22/2015  . Paresthesia 11/16/2013  . Acute blood loss anemia 07/19/2013  . Fracture of femur, right, closed ([place]) 07/11/2013  . URI (upper respiratory infection) 06/15/2013  . Cough 03/15/2013  . Vision loss, right eye 01/05/2012  . UTI (lower urinary tract infection) 03/31/2011  . Rash 11/05/2010  . SHOULDER PAIN 04/17/2009  . TOBACCO USE, QUIT 04/17/2009  . GASTRITIS 12/25/2008  . ABNORMAL EXAM-BILIARY TRACT 10/16/2008  . GASTROINTESTINAL XRAY, ABNORMAL 10/16/2008  . ANAL PRURITUS 09/17/2008  . Edema 09/17/2008  . OTHER DYSPHAGIA 09/17/2008  . HAND PAIN 09/07/2007  . Vitamin B 12 deficiency 07/05/2007  . PARESTHESIA 07/05/2007  . TINNITUS NOS 03/23/2007  . URINARY INCONTINENCE 03/21/2007  . DM type 2 causing CKD stage 3 ([place]) 01/16/2007  . Vitamin D deficiency 01/16/2007  . Gout 01/16/2007  . HYPERKALEMIA 01/16/2007  . OBESITY, MORBID 01/16/2007  . Essential hypertension 01/16/2007  . GERD 01/16/2007  . Osteoarthritis 01/16/2007  . SYMPTOM, INCONTINENCE, URINARY NEC 01/16/2007  . COLONIC POLYPS, HX OF 01/16/2007    Current Outpatient Medications  Medication Sig Dispense Refill  . aspirin 81 MG tablet Take 81 mg by mouth daily.    . Cholecalciferol (EQL VITAMIN D3) 1000 UNITS tablet Take 1,000 Units by mouth daily. For supplement    . losartan (COZAAR) 50 MG tablet TAKE 1 TABLET EVERY DAY  FOR  HYPERTENSION 90 tablet 1  . metFORMIN (GLUCOPHAGE) 500 MG tablet TAKE 1 TABLET TWICE DAILY WITH A MEAL FOR DIABETES 180 tablet 1  . naproxen sodium (ANAPROX) 220 MG tablet Take 440 mg by mouth as needed.    . potassium chloride (K-DUR,KLOR-CON) 10 MEQ tablet Take 1 tablet (10 mEq total) by mouth daily.  90 tablet 1  . repaglinide (PRANDIN) 1 MG tablet 1 tab ac qd-tid. Use Prandin (repaglinide) only if you eat 90 tablet 11  . verapamil (VERELAN PM) 360 MG 24 hr capsule TAKE 1 CAPSULE AT BEDTIME  FOR  HYPERTENSION 90 capsule 1  . vitamin B-12 (CYANOCOBALAMIN) 1000 MCG tablet Take 1,000 mcg by mouth daily.      . meloxicam (MOBIC) 7.5 MG tablet Take 1 tablet (7.5 mg total) by mouth daily. 30 tablet 0   No current facility-administered medications for this visit.     Allergies: Ditropan [oxybutynin] and Penicillins  Past Medical History:  Diagnosis Date  . Diabetes mellitus    type II  . Diverticulosis   . Gastritis    w/ hx of h. pylori  . GERD (gastroesophageal reflux disease)   . Gout   . Hyperkalemia   . Hypertension   . Leiomyoma 2-02   colonic polypoid   . Morbid obesity ([place])   . Osteoarthritis   . Pain in joint, lower leg   . Unspecified vitamin D deficiency   . Urinary incontinence     Past Surgical History:  Procedure Laterality Date  . CHOLECYSTECTOMY    . LITHOTRIPSY    . ORIF FEMUR FRACTURE N/A 07/12/2013   Procedure: OPEN REDUCTION INTERNAL FIXATION (ORIF) DISTAL FEMUR FRACTURE;  Surgeon: Hessie Dibble, MD;  Location: [facility];  Service: Orthopedics;  Laterality: N/A;  . right tkr    . thr left    .  thr left 3/4    . TOTAL HIP ARTHROPLASTY  2009   right  . TOTAL HIP ARTHROPLASTY     left    Family History  Problem Relation Age of Onset  . Cancer Sister        breast, stomach  . Diabetes Sister   . Diabetes Other   . Colon cancer Neg Hx   . Stomach cancer Neg Hx   . Esophageal cancer Neg Hx     Social History   Tobacco Use  . Smoking status: Former Smoker    Last attempt to quit: 06/07/1960    Years since quitting: 56.9  . Smokeless tobacco: Never Used  Substance Use Topics  . Alcohol use: No    Subjective:  Patient notes that she fell "out of the bed" almost 2 weeks ago- she fell on her right side; notes that has continued to have low back pain;  notes that pain is noticeable with most movement especially when moving from sitting to standing position; states that she just "feels anxious and jittery" most of the time- feels that her back pain has made these symptoms worse; admits that sensation of feeling of being "jittery" is at its worst when she is standing/ stands up;  saw GI today and they ordered a lumbar x-ray today; admits that she has not taken her blood pressure today- feels this is why her pressure is elevated; notes that she does occasionally miss her medications.   Objective:  Vitals:   05/11/17 1315  BP: (!) 178/92  Pulse: 68  SpO2: 97%  Weight: 214 lb (97.1 kg)  Height: 5\' 2"  (1.575 m)    General: Well developed, well nourished, in no acute distress  Skin : Warm and dry.  Head: Normocephalic and atraumatic  Lungs: Respirations unlabored; clear to auscultation bilaterally without wheeze, rales, rhonchi  CVS exam: normal rate and regular rhythm.  Musculoskeletal: No deformities; no active joint inflammation  Extremities: No edema, cyanosis, clubbing  Neurologic: Alert and oriented; speech intact; face symmetrical; moves all extremities well; CNII-XII intact without focal deficit  Assessment:  1. Essential hypertension   2. Bilateral low back pain without sciatica, unspecified chronicity   3. Type 2 diabetes mellitus with stage 3 chronic kidney disease, without long-term current use of insulin ([place])     Plan:  1. Uncontrolled today as patient is not on her medication; encouraged to take daily as prescribed; 2. Lumbar x-ray has been done today and results are pending; will try using Mobic 7.5 mg daily; follow-up treatment will be determined based on x-ray results. 3. Refill updated on Metformin; she is to keep her follow-up with her PCP for the end of January.   No Follow-up on file.  No orders of the defined types were placed in this encounter.   Requested Prescriptions   Signed Prescriptions Disp Refills  .  potassium chloride (K-DUR,KLOR-CON) 10 MEQ tablet 90 tablet 1    Sig: Take 1 tablet (10 mEq total) by mouth daily.  . metFORMIN (GLUCOPHAGE) 500 MG tablet 180 tablet 1    Sig: TAKE 1 TABLET TWICE DAILY WITH A MEAL FOR DIABETES  . meloxicam (MOBIC) 7.5 MG tablet 30 tablet 0    Sig: Take 1 tablet (7.5 mg total) by mouth daily.

## 2017-05-11 NOTE — Patient Instructions (Addendum)
Please go to the basement level to our Radiology department for a back x-ray.   We have made you an appointment for today 12-5 on the first floor, [facility] at 1:40 PM. . Arrive at 1:25 PM today 12-5.  You will see Jodi Mourning

## 2017-05-11 NOTE — Telephone Encounter (Signed)
I called to speak with pt however her daughter Timothy Lasso answered the phone and said she is the primary person to contact for pt's medical needs.  I mentioned to her that she was not on the [facility].   Daughter stated she had signed the papers and that she was the primary contact. Let her know I was returning a call that one of our triage nurses made to pt yesterday but did not get an answer.   This was a follow up call. Daughter stated she was having a problem with constipation.   She has an appt with the GI doctor this morning at 10:30am regarding this.    There wasn't anything she needed from me at this time since she was seeing the GI doctor this morning.

## 2017-05-12 NOTE — Telephone Encounter (Addendum)
Patient's daughter called and said the patient was in the doctor's office yesterday and gave all the information the office was requesting from her for her mother to receive the Ensure.  So she is calling following up on the request as to when she will get the Ensure.  Daughter said is she does not answer to please leave a message.

## 2017-05-13 NOTE — Progress Notes (Signed)
Reviewed and agree with documentation and assessment and plan. K. Veena Sherri Mcarthy , MD   

## 2017-05-13 NOTE — Telephone Encounter (Signed)
Please advise about Ensure

## 2017-05-13 NOTE — Telephone Encounter (Signed)
LM for daughter to call back.

## 2017-05-13 NOTE — Telephone Encounter (Signed)
I don't understand the question Thx

## 2017-05-17 ENCOUNTER — Other Ambulatory Visit: Payer: Self-pay | Admitting: Internal Medicine

## 2017-05-17 NOTE — Telephone Encounter (Signed)
Copied from [place] [id number]. Topic: Quick Communication - See Telephone Encounter >> May 17, 2017 12:27 PM Ether Griffins B wrote: CRM for notification. See Telephone encounter for:  Pt needing refill on metformin and potassium. Pt has been out for a couple of days  05/17/17.

## 2017-05-18 NOTE — Telephone Encounter (Signed)
Left message for pt to contact [facility] Mail delivery. Chart is showing that requested medications were refilled on 05/11/17.

## 2017-05-23 ENCOUNTER — Telehealth: Payer: Self-pay | Admitting: Internal Medicine

## 2017-05-23 ENCOUNTER — Telehealth: Payer: Self-pay

## 2017-05-23 MED ORDER — ENSURE ORIGINAL PO LIQD: 1.0000 | Freq: Every day | ORAL | 3 refills | Status: DC

## 2017-05-23 NOTE — Telephone Encounter (Signed)
Copied from [place]. Topic: Quick Communication - Office Called Patient >> May 23, 2017  8:56 AM Robina Ade, Helene Kelp D wrote: Reason for CRM: Patient daughter called back Betty's phone call and said that Patient needs the Ensure to be sent to patients' home and the flavor she would like is vanilla or strawberries.

## 2017-05-23 NOTE — Telephone Encounter (Signed)
RX faxed to [place]

## 2017-05-23 NOTE — Telephone Encounter (Signed)
Copied from [place] [phone]. Topic: General - Other >> May 23, 2017 12:32 PM Valla Leaver wrote: Reason for CRM: Rollene Fare, pt acc rep w/ [facility] calling b/c patient needs Ensure to be sent to patient's home but it's not covered by her insurance. This was recommended by Dr. Scarlette Calico. Please call back to advise.

## 2017-05-23 NOTE — Telephone Encounter (Signed)
See other TE.

## 2017-05-23 NOTE — Telephone Encounter (Signed)
LM notifying daughter that Ensure is NOT covered by insurance.

## 2017-06-09 ENCOUNTER — Telehealth: Payer: Self-pay | Admitting: Internal Medicine

## 2017-06-09 NOTE — Telephone Encounter (Unsigned)
Copied from [place] [phone]. Topic: Inquiry >> Jun 09, 2017 12:54 PM Neva Seat wrote: Pt's daughter is calling - Holly Banks - she states she has called a number of times over the past 8 weeks with no help from practice.  Her mother is suffering from not being able to eat and needing a supplement to give her nourishment that her insurance will cover. Holly Banks is needing a call back to discuss immediate care for her mother ASAP.

## 2017-06-09 NOTE — Telephone Encounter (Signed)
° °  Reason for call:   Holly Banks daughter (currently with patient, not reflected on the [facility]) called stating she's very upset and would like to speak with the PCP directly regarding message below, daughter would like to know alternate since Ensure is not coverage best contact #  [phone]

## 2017-06-09 NOTE — Telephone Encounter (Signed)
LM notifying daughter that Medicaid is the only insurance that will pay for any sort of nutritional supplement. In the meantime, she will have to continue paying out of pocket for it or buy generic supplements. There are coupons online they can print off

## 2017-06-10 NOTE — Telephone Encounter (Signed)
Daughter called - she can not afford the supplements. She needs another way to get nutrition. She is disabled and can not afford it supplements.  Cb# [id number].  She would like to know when a good time to call her back and she will call. She will be in doc appt until 11 am.

## 2017-06-10 NOTE — Telephone Encounter (Signed)
Daughter notified that the only insurance that will cover nutritional supplements is medicaid. She voiced understanding and samples were set up front for pt

## 2017-06-10 NOTE — Telephone Encounter (Signed)
See other TE.

## 2017-07-04 ENCOUNTER — Telehealth: Payer: Self-pay

## 2017-07-04 NOTE — Telephone Encounter (Signed)
She has a standing order for diabetic labs.  No need to fast.  Thank you

## 2017-07-04 NOTE — Telephone Encounter (Signed)
Copied from [place] [id number]. Topic: General - Other >> Jul 04, 2017 11:28 AM Holly Banks wrote: Reason for CRM: patient calling wanting to know if she will have blood taken on Wednesday for her appt if she do need to get blood drawn she would like to come in tomorrow morning to get fasting labs done

## 2017-07-05 NOTE — Telephone Encounter (Signed)
Pt daughter notified   

## 2017-07-06 ENCOUNTER — Ambulatory Visit: Payer: Medicare PPO | Admitting: Internal Medicine

## 2017-07-07 ENCOUNTER — Encounter: Payer: Self-pay | Admitting: Internal Medicine

## 2017-07-07 ENCOUNTER — Other Ambulatory Visit (INDEPENDENT_AMBULATORY_CARE_PROVIDER_SITE_OTHER): Payer: Medicare HMO

## 2017-07-07 ENCOUNTER — Ambulatory Visit (INDEPENDENT_AMBULATORY_CARE_PROVIDER_SITE_OTHER): Payer: Medicare HMO | Admitting: Internal Medicine

## 2017-07-07 VITALS — BP 164/80 | HR 75 | Temp 98.5°F | Ht 62.0 in | Wt 218.0 lb

## 2017-07-07 DIAGNOSIS — E1122 Type 2 diabetes mellitus with diabetic chronic kidney disease: Secondary | ICD-10-CM | POA: Diagnosis not present

## 2017-07-07 DIAGNOSIS — R251 Tremor, unspecified: Secondary | ICD-10-CM | POA: Diagnosis not present

## 2017-07-07 DIAGNOSIS — N183 Chronic kidney disease, stage 3 unspecified: Secondary | ICD-10-CM

## 2017-07-07 DIAGNOSIS — E538 Deficiency of other specified B group vitamins: Secondary | ICD-10-CM

## 2017-07-07 DIAGNOSIS — E559 Vitamin D deficiency, unspecified: Secondary | ICD-10-CM

## 2017-07-07 LAB — CBC WITH DIFFERENTIAL/PLATELET
BASOS ABS: 0 10*3/uL (ref 0.0–0.1)
Basophils Relative: 0.2 % (ref 0.0–3.0)
Eosinophils Absolute: 0.1 10*3/uL (ref 0.0–0.7)
Eosinophils Relative: 1.3 % (ref 0.0–5.0)
HCT: 33.2 % — ABNORMAL LOW (ref 36.0–46.0)
HEMOGLOBIN: 10.5 g/dL — AB (ref 12.0–15.0)
Lymphocytes Relative: 30.8 % (ref 12.0–46.0)
Lymphs Abs: 2 10*3/uL (ref 0.7–4.0)
MCHC: 31.6 g/dL (ref 30.0–36.0)
MCV: 83.3 fl (ref 78.0–100.0)
MONOS PCT: 9.1 % (ref 3.0–12.0)
Monocytes Absolute: 0.6 10*3/uL (ref 0.1–1.0)
NEUTROS PCT: 58.6 % (ref 43.0–77.0)
Neutro Abs: 3.8 10*3/uL (ref 1.4–7.7)
Platelets: 208 10*3/uL (ref 150.0–400.0)
RBC: 3.98 Mil/uL (ref 3.87–5.11)
RDW: 13.8 % (ref 11.5–15.5)
WBC: 6.6 10*3/uL (ref 4.0–10.5)

## 2017-07-07 LAB — TSH: TSH: 3.63 u[IU]/mL (ref 0.35–4.50)

## 2017-07-07 MED ORDER — HYDROXYZINE HCL 10 MG PO TABS: 10.0000 mg | ORAL_TABLET | Freq: Three times a day (TID) | ORAL | 1 refills | Status: DC | PRN

## 2017-07-07 NOTE — Progress Notes (Signed)
Subjective:  Patient ID: Holly Banks, female    DOB: 1929-04-15  Age: 84 y.o. MRN: [id number]  CC: No chief complaint on file.   HPI Holly Banks presents for L 5th digit cyst ruptured F/u tremor in hands when using a walker C/o OA in B shoulders L>>R  Outpatient Medications Prior to Visit  Medication Sig Dispense Refill  . aspirin 81 MG tablet Take 81 mg by mouth daily.    . Cholecalciferol (EQL VITAMIN D3) 1000 UNITS tablet Take 1,000 Units by mouth daily. For supplement    . losartan (COZAAR) 50 MG tablet TAKE 1 TABLET EVERY DAY  FOR  HYPERTENSION 90 tablet 1  . meloxicam (MOBIC) 7.5 MG tablet TAKE 1 TABLET(7.5 MG) BY MOUTH DAILY 90 tablet 0  . metFORMIN (GLUCOPHAGE) 500 MG tablet TAKE 1 TABLET TWICE DAILY WITH A MEAL FOR DIABETES 180 tablet 1  . naproxen sodium (ANAPROX) 220 MG tablet Take 440 mg by mouth as needed.    . Nutritional Supplements (ENSURE ORIGINAL) LIQD Take 1-3 Bottles by mouth daily. 90 Bottle 3  . potassium chloride (K-DUR,KLOR-CON) 10 MEQ tablet Take 1 tablet (10 mEq total) by mouth daily. 90 tablet 1  . repaglinide (PRANDIN) 1 MG tablet 1 tab ac qd-tid. Use Prandin (repaglinide) only if you eat 90 tablet 11  . verapamil (VERELAN PM) 360 MG 24 hr capsule TAKE 1 CAPSULE AT BEDTIME  FOR  HYPERTENSION 90 capsule 1  . vitamin B-12 (CYANOCOBALAMIN) 1000 MCG tablet Take 1,000 mcg by mouth daily.       No facility-administered medications prior to visit.     ROS Review of Systems  Constitutional: Positive for fatigue. Negative for activity change, appetite change, chills and unexpected weight change.  HENT: Negative for congestion, mouth sores and sinus pressure.   Eyes: Negative for visual disturbance.  Respiratory: Negative for cough and chest tightness.   Gastrointestinal: Negative for abdominal pain and nausea.  Genitourinary: Negative for difficulty urinating, frequency and vaginal pain.  Musculoskeletal: Positive for arthralgias, back pain, gait  problem and joint swelling.  Skin: Negative for pallor and rash.  Neurological: Positive for tremors and weakness. Negative for dizziness, numbness and headaches.  Psychiatric/Behavioral: Negative for confusion and sleep disturbance.    Objective:  BP (!) 164/80 (BP Location: Left Arm, Patient Position: Sitting, Cuff Size: Large)   Pulse 75   Temp 98.5 F (36.9 C) (Oral)   Ht 5\' 2"  (1.575 m)   Wt 218 lb (98.9 kg)   SpO2 99%   BMI 39.87 kg/m   BP Readings from Last 3 Encounters:  07/07/17 (!) 164/80  05/11/17 (!) 178/92  05/11/17 (!) 160/84    Wt Readings from Last 3 Encounters:  07/07/17 218 lb (98.9 kg)  05/11/17 214 lb (97.1 kg)  05/11/17 214 lb (97.1 kg)    Physical Exam  Constitutional: She appears well-developed. No distress.  HENT:  Head: Normocephalic.  Right Ear: External ear normal.  Left Ear: External ear normal.  Nose: Nose normal.  Mouth/Throat: Oropharynx is clear and moist.  Eyes: Conjunctivae are normal. Pupils are equal, round, and reactive to light. Right eye exhibits no discharge. Left eye exhibits no discharge.  Neck: Normal range of motion. Neck supple. No JVD present. No tracheal deviation present. No thyromegaly present.  Cardiovascular: Normal rate, regular rhythm and normal heart sounds.  Pulmonary/Chest: No stridor. No respiratory distress. She has no wheezes.  Abdominal: Soft. Bowel sounds are normal. She exhibits no distension and no mass. There  is no tenderness. There is no rebound and no guarding.  Musculoskeletal: She exhibits tenderness. She exhibits no edema.  Lymphadenopathy:    She has no cervical adenopathy.  Neurological: She displays normal reflexes. No cranial nerve deficit. She exhibits normal muscle tone. Coordination abnormal.  Skin: No rash noted. No erythema.  Psychiatric: She has a normal mood and affect. Her behavior is normal. Judgment and thought content normal.  walker Atrophy in arms/shoulders; restricted ROM  Lab  Results  Component Value Date   WBC 6.5 04/05/2017   HGB 10.8 (L) 04/05/2017   HCT 34.5 (L) 04/05/2017   PLT 236.0 04/05/2017   GLUCOSE 153 (H) 04/01/2017   CHOL 198 05/04/2011   TRIG 116.0 05/04/2011   HDL 62.30 05/04/2011   LDLDIRECT 126.1 03/31/2010   LDLCALC 113 (H) 05/04/2011   ALT 12 (L) 02/15/2017   AST 19 02/15/2017   NA 143 04/01/2017   K 3.7 04/01/2017   CL 106 04/01/2017   CREATININE 0.69 04/01/2017   BUN 13 04/01/2017   CO2 30 04/01/2017   TSH 3.00 08/23/2016   INR 2.2 (H) 10/25/2007   HGBA1C 5.8 04/01/2017   MICROALBUR 11.9 (H) 08/23/2016    Dg Lumbar Spine 2-3 Views  Result Date: 05/11/2017 CLINICAL DATA:  Pain following fall EXAM: LUMBAR SPINE - 2-3 VIEW COMPARISON:  CT abdomen and pelvis February 16, 2017 FINDINGS: Frontal, lateral, and spot lumbosacral lateral images were obtained. There are 5 non-rib-bearing lumbar type vertebral bodies. There is thoracolumbar levoscoliosis. There is no fracture. There is 6 mm of anterolisthesis of L4 on L5. There is no other spondylolisthesis. There is moderately severe disc space narrowing at L4-5. There is slight disc space narrowing at L3-4. There are anterior osteophytes at all levels. No erosive change. There is aortic atherosclerosis. There are multiple calcifications in the pelvis felt to represent uterine leiomyomas. There is a total hip replacement on the left. IMPRESSION: 1. 6 mm of anterolisthesis of L4 on L5, likely due to underlying spondylosis. No fracture evident. No other spondylolisthesis. 2.  Scoliosis, convex to the left. 3.  Multilevel osteoarthritic change, most notably at L4-5. 4.  Aortic atherosclerosis. 5. Multiple calcified uterine leiomyomas, also noted on recent prior CT. Aortic Atherosclerosis (ICD10-I70.0). Electronically Signed   By: Lowella Grip III M.D.   On: 05/11/2017 15:30    Assessment & Plan:   There are no diagnoses linked to this encounter. I am having Holly Banks maintain her  vitamin B-12, Cholecalciferol, aspirin, repaglinide, naproxen sodium, losartan, verapamil, potassium chloride, metFORMIN, meloxicam, and ENSURE ORIGINAL.  No orders of the defined types were placed in this encounter.    Follow-up: No Follow-up on file.  [name], MD

## 2017-07-07 NOTE — Assessment & Plan Note (Signed)
B 12

## 2017-07-07 NOTE — Assessment & Plan Note (Signed)
Labs

## 2017-07-07 NOTE — Assessment & Plan Note (Signed)
Vit D 

## 2017-07-07 NOTE — Assessment & Plan Note (Signed)
Hands tremor when using a walker - weak arms and may need a new walker

## 2017-07-10 ENCOUNTER — Encounter: Payer: Self-pay | Admitting: Internal Medicine

## 2017-07-11 ENCOUNTER — Other Ambulatory Visit: Payer: Self-pay | Admitting: Internal Medicine

## 2017-08-05 DIAGNOSIS — E669 Obesity, unspecified: Secondary | ICD-10-CM | POA: Diagnosis not present

## 2017-08-05 DIAGNOSIS — M1990 Unspecified osteoarthritis, unspecified site: Secondary | ICD-10-CM | POA: Diagnosis not present

## 2017-08-05 DIAGNOSIS — I10 Essential (primary) hypertension: Secondary | ICD-10-CM | POA: Diagnosis not present

## 2017-08-05 DIAGNOSIS — E538 Deficiency of other specified B group vitamins: Secondary | ICD-10-CM | POA: Diagnosis not present

## 2017-08-05 DIAGNOSIS — E1142 Type 2 diabetes mellitus with diabetic polyneuropathy: Secondary | ICD-10-CM | POA: Diagnosis not present

## 2017-08-05 DIAGNOSIS — Z7984 Long term (current) use of oral hypoglycemic drugs: Secondary | ICD-10-CM | POA: Diagnosis not present

## 2017-08-05 DIAGNOSIS — Z6837 Body mass index (BMI) 37.0-37.9, adult: Secondary | ICD-10-CM | POA: Diagnosis not present

## 2017-08-05 DIAGNOSIS — H5461 Unqualified visual loss, right eye, normal vision left eye: Secondary | ICD-10-CM | POA: Diagnosis not present

## 2017-10-03 ENCOUNTER — Ambulatory Visit: Payer: Medicare HMO | Admitting: Internal Medicine

## 2017-10-04 ENCOUNTER — Ambulatory Visit: Payer: Medicare HMO | Admitting: Internal Medicine

## 2017-10-05 ENCOUNTER — Encounter: Payer: Self-pay | Admitting: Internal Medicine

## 2017-10-05 ENCOUNTER — Ambulatory Visit (INDEPENDENT_AMBULATORY_CARE_PROVIDER_SITE_OTHER): Payer: Medicare HMO | Admitting: Internal Medicine

## 2017-10-05 ENCOUNTER — Other Ambulatory Visit (INDEPENDENT_AMBULATORY_CARE_PROVIDER_SITE_OTHER): Payer: Medicare HMO

## 2017-10-05 VITALS — BP 138/68 | HR 96 | Temp 98.4°F | Ht 62.0 in | Wt 216.0 lb

## 2017-10-05 DIAGNOSIS — R269 Unspecified abnormalities of gait and mobility: Secondary | ICD-10-CM | POA: Insufficient documentation

## 2017-10-05 DIAGNOSIS — N183 Chronic kidney disease, stage 3 unspecified: Secondary | ICD-10-CM

## 2017-10-05 DIAGNOSIS — E1122 Type 2 diabetes mellitus with diabetic chronic kidney disease: Secondary | ICD-10-CM | POA: Diagnosis not present

## 2017-10-05 DIAGNOSIS — E559 Vitamin D deficiency, unspecified: Secondary | ICD-10-CM | POA: Diagnosis not present

## 2017-10-05 DIAGNOSIS — E538 Deficiency of other specified B group vitamins: Secondary | ICD-10-CM | POA: Diagnosis not present

## 2017-10-05 DIAGNOSIS — L8990 Pressure ulcer of unspecified site, unspecified stage: Secondary | ICD-10-CM | POA: Insufficient documentation

## 2017-10-05 DIAGNOSIS — R531 Weakness: Secondary | ICD-10-CM

## 2017-10-05 DIAGNOSIS — L89322 Pressure ulcer of left buttock, stage 2: Secondary | ICD-10-CM

## 2017-10-05 DIAGNOSIS — I10 Essential (primary) hypertension: Secondary | ICD-10-CM

## 2017-10-05 LAB — BASIC METABOLIC PANEL
BUN: 28 mg/dL — AB (ref 6–23)
CHLORIDE: 104 meq/L (ref 96–112)
CO2: 28 mEq/L (ref 19–32)
CREATININE: 1.06 mg/dL (ref 0.40–1.20)
Calcium: 9.9 mg/dL (ref 8.4–10.5)
GFR: 63.04 mL/min (ref >60.00)
GLUCOSE: 201 mg/dL — AB (ref 70–99)
POTASSIUM: 4.3 meq/L (ref 3.5–5.1)
Sodium: 141 mEq/L (ref 135–145)

## 2017-10-05 LAB — HEMOGLOBIN A1C: HEMOGLOBIN A1C: 6.4 % (ref 4.6–6.5)

## 2017-10-05 NOTE — Assessment & Plan Note (Signed)
PT at home 

## 2017-10-05 NOTE — Progress Notes (Signed)
Subjective:  Patient ID: Holly Banks, female    DOB: 03-17-1929  Age: 84 y.o. MRN: [id number]  CC: No chief complaint on file.   HPI Vickee Mormino presents for DM, HTN, OA f/u C/o shaking - hands C/o weakness when I'm up...  Outpatient Medications Prior to Visit  Medication Sig Dispense Refill  . aspirin 81 MG tablet Take 81 mg by mouth daily.    . Cholecalciferol (EQL VITAMIN D3) 1000 UNITS tablet Take 1,000 Units by mouth daily. For supplement    . hydrOXYzine (ATARAX/VISTARIL) 10 MG tablet Take 1 tablet (10 mg total) by mouth 3 (three) times daily as needed for itching. 180 tablet 1  . losartan (COZAAR) 50 MG tablet TAKE 1 TABLET EVERY DAY  FOR  HYPERTENSION 90 tablet 1  . meloxicam (MOBIC) 7.5 MG tablet TAKE 1 TABLET(7.5 MG) BY MOUTH DAILY 90 tablet 0  . metFORMIN (GLUCOPHAGE) 500 MG tablet TAKE 1 TABLET TWICE DAILY WITH A MEAL FOR DIABETES 180 tablet 1  . naproxen sodium (ANAPROX) 220 MG tablet Take 440 mg by mouth as needed.    . Nutritional Supplements (ENSURE ORIGINAL) LIQD Take 1-3 Bottles by mouth daily. 90 Bottle 3  . potassium chloride (K-DUR,KLOR-CON) 10 MEQ tablet Take 1 tablet (10 mEq total) by mouth daily. 90 tablet 1  . repaglinide (PRANDIN) 1 MG tablet 1 tab ac qd-tid. Use Prandin (repaglinide) only if you eat 90 tablet 11  . verapamil (VERELAN PM) 360 MG 24 hr capsule TAKE 1 CAPSULE AT BEDTIME  FOR  HYPERTENSION 90 capsule 1  . vitamin B-12 (CYANOCOBALAMIN) 1000 MCG tablet Take 1,000 mcg by mouth daily.       No facility-administered medications prior to visit.     ROS Review of Systems  Constitutional: Positive for fatigue. Negative for activity change, appetite change, chills and unexpected weight change.  HENT: Negative for congestion, mouth sores and sinus pressure.   Eyes: Negative for visual disturbance.  Respiratory: Positive for shortness of breath. Negative for cough and chest tightness.   Gastrointestinal: Negative for abdominal pain and  nausea.  Genitourinary: Negative for difficulty urinating, frequency and vaginal pain.  Musculoskeletal: Positive for arthralgias and gait problem. Negative for back pain.  Skin: Negative for pallor and rash.  Neurological: Positive for tremors and weakness. Negative for dizziness, numbness and headaches.  Psychiatric/Behavioral: Negative for confusion, sleep disturbance and suicidal ideas.    Objective:  BP 138/68 (BP Location: Right Arm, Patient Position: Sitting, Cuff Size: Large)   Pulse 96   Temp 98.4 F (36.9 C) (Oral)   Ht 5\' 2"  (1.575 m)   Wt 216 lb (98 kg)   SpO2 100%   BMI 39.51 kg/m   BP Readings from Last 3 Encounters:  10/05/17 138/68  07/07/17 (!) 164/80  05/11/17 (!) 178/92    Wt Readings from Last 3 Encounters:  10/05/17 216 lb (98 kg)  07/07/17 218 lb (98.9 kg)  05/11/17 214 lb (97.1 kg)    Physical Exam  Constitutional: She appears well-developed. No distress.  HENT:  Head: Normocephalic.  Right Ear: External ear normal.  Left Ear: External ear normal.  Nose: Nose normal.  Mouth/Throat: Oropharynx is clear and moist.  Eyes: Pupils are equal, round, and reactive to light. Conjunctivae are normal. Right eye exhibits no discharge. Left eye exhibits no discharge.  Neck: Normal range of motion. Neck supple. No JVD present. No tracheal deviation present. No thyromegaly present.  Cardiovascular: Normal rate, regular rhythm and normal heart sounds.  Pulmonary/Chest: No stridor. No respiratory distress. She has no wheezes.  Abdominal: Soft. Bowel sounds are normal. She exhibits no distension and no mass. There is no tenderness. There is no rebound and no guarding.  Musculoskeletal: She exhibits tenderness. She exhibits no edema.  Lymphadenopathy:    She has no cervical adenopathy.  Neurological: She displays normal reflexes. No cranial nerve deficit. She exhibits normal muscle tone. Coordination abnormal.  Skin: No rash noted. No erythema.  Psychiatric: She  has a normal mood and affect. Her behavior is normal. Judgment and thought content normal.   Obese Walker Weak shaky arms L buttock decubitus ulcer 2x1 cm   Lab Results  Component Value Date   WBC 6.6 07/07/2017   HGB 10.5 (L) 07/07/2017   HCT 33.2 (L) 07/07/2017   PLT 208.0 07/07/2017   GLUCOSE 153 (H) 04/01/2017   CHOL 198 05/04/2011   TRIG 116.0 05/04/2011   HDL 62.30 05/04/2011   LDLDIRECT 126.1 03/31/2010   LDLCALC 113 (H) 05/04/2011   ALT 12 (L) 02/15/2017   AST 19 02/15/2017   NA 143 04/01/2017   K 3.7 04/01/2017   CL 106 04/01/2017   CREATININE 0.69 04/01/2017   BUN 13 04/01/2017   CO2 30 04/01/2017   TSH 3.63 07/07/2017   INR 2.2 (H) 10/25/2007   HGBA1C 5.8 04/01/2017   MICROALBUR 11.9 (H) 08/23/2016    Dg Lumbar Spine 2-3 Views  Result Date: 05/11/2017 CLINICAL DATA:  Pain following fall EXAM: LUMBAR SPINE - 2-3 VIEW COMPARISON:  CT abdomen and pelvis February 16, 2017 FINDINGS: Frontal, lateral, and spot lumbosacral lateral images were obtained. There are 5 non-rib-bearing lumbar type vertebral bodies. There is thoracolumbar levoscoliosis. There is no fracture. There is 6 mm of anterolisthesis of L4 on L5. There is no other spondylolisthesis. There is moderately severe disc space narrowing at L4-5. There is slight disc space narrowing at L3-4. There are anterior osteophytes at all levels. No erosive change. There is aortic atherosclerosis. There are multiple calcifications in the pelvis felt to represent uterine leiomyomas. There is a total hip replacement on the left. IMPRESSION: 1. 6 mm of anterolisthesis of L4 on L5, likely due to underlying spondylosis. No fracture evident. No other spondylolisthesis. 2.  Scoliosis, convex to the left. 3.  Multilevel osteoarthritic change, most notably at L4-5. 4.  Aortic atherosclerosis. 5. Multiple calcified uterine leiomyomas, also noted on recent prior CT. Aortic Atherosclerosis (ICD10-I70.0). Electronically Signed   By: Lowella Grip III M.D.   On: 05/11/2017 15:30    Assessment & Plan:   There are no diagnoses linked to this encounter. I am having Holly Banks maintain her vitamin B-12, Cholecalciferol, aspirin, repaglinide, naproxen sodium, potassium chloride, metFORMIN, meloxicam, ENSURE ORIGINAL, hydrOXYzine, losartan, and verapamil.  No orders of the defined types were placed in this encounter.    Follow-up: No follow-ups on file.  [name], MD

## 2017-10-05 NOTE — Assessment & Plan Note (Signed)
L buttock decubitus ulcer 2x1 cm  Home Care Memory foam pad

## 2017-10-05 NOTE — Assessment & Plan Note (Signed)
On vit D 

## 2017-10-05 NOTE — Assessment & Plan Note (Signed)
Multifactorial  Home PT

## 2017-10-05 NOTE — Assessment & Plan Note (Signed)
On Metformin 

## 2017-10-05 NOTE — Patient Instructions (Signed)
Memory foam pad Pressure Injury A pressure injury, sometimes called a bedsore, is an injury to the skin and underlying tissue caused by pressure. Pressure on blood vessels causes decreased blood flow to the skin, which can eventually cause the skin tissue to die and break down into a wound. Pressure injuries usually occur:  Over bony parts of the body such as the tailbone, shoulders, elbows, hips, and heels.  Under medical devices such as respiratory equipment, stockings, tubes, and splints.  Pressure injuries start as reddened areas on the skin and can lead to pain, muscle damage, and infection. Pressure injuries can vary in severity. What are the causes? Pressure injuries are caused by a lack of blood supply to an area of skin. They can occur from intense pressure over a short period of time or from less intense pressure over a long period of time. What increases the risk? This condition is more likely to develop in people who:  Are in the hospital or an extended care facility.  Are bedridden or in a wheelchair.  Have an injury or disease that keeps them from: ? Moving normally. ? Feeling pain or pressure.  Have a condition that: ? Makes them sleepy or less alert. ? Causes poor blood flow.  Need to wear a medical device.  Have poor control of their bladder or bowel functions (incontinence).  Have poor nutrition (malnutrition).  Are of certain ethnicities. People of African American and Latino or Hispanic descent are at higher risk compared to other ethnic groups.  If you are at risk for pressure ulcers, your health care provider may recommend certain types of bedding to help prevent them. These may include foam or gel mattresses covered with one of the following:  A sheepskin blanket.  A pad that is filled with gel, air, water, or foam.  What are the signs or symptoms? The main symptom is a blister or change in skin color that opens into a wound. Other symptoms  include:  Red or dark areas of skin that do not turn white or pale when pressed with a finger.  Pain, warmth, or change of skin texture.  How is this diagnosed? This condition is diagnosed with a medical history and physical exam. You may also have tests, including:  Blood tests to check for infection or signs of poor nutrition.  Imaging studies to check for damage to the deep tissues under your skin.  Blood flow studies.  Your pressure injury will be staged to determine its severity. Staging is an assessment of:  The depth of the pressure injury.  Which tissues are exposed because of the pressure injury.  The causes of the pressure injury.  How is this treated? The main focus of treatment is to help your injury heal. This may be done by:  Relieving or redistributing pressure on your skin. This includes: ? Frequently changing your position. ? Eliminating or minimizing positions that caused the wound or that can make the wound worse. ? Using specific bed mattresses and chair cushions. ? Refitting, resizing, or replacing any medical devices, or padding the skin under them. ? Using creams or powders to prevent rubbing (friction) on the skin.  Keeping your skin clean and dry. This may include using a skin cleanser or skin protectant as told by your health care provider. This may be a lotion, ointment, or spray.  Cleaning your injury and removing any dead tissue from the wound (debridement).  Placing a bandage (dressing) over your injury.  Preventing  or treating infection. This may include antibiotic, antimicrobial, or antiseptic medicines.  Treatment may also include medicine for pain. Sometimes surgery is needed to close the wound with a flap of healthy skin or a piece of skin from another area of your body (graft). You may need surgery if other treatments are not working or if your injury is very deep. Follow these instructions at home: Wound care  Follow instructions from  your health care provider about: ? How to take care of your wound. ? When and how you should change your dressing. ? When you should remove your dressing. If your dressing is dry and stuck when you try to remove it, moisten or wet the dressing with saline or water so that it can be removed without harming your skin or wound tissue.  Check your wound every day for signs of infection. Have a caregiver do this for you if you are not able. Watch for: ? More redness, swelling, or pain. ? More fluid, blood, or pus. ? A bad smell. Skin Care  Keep your skin clean and dry. Gently pat your skin dry.  Do not rub or massage your skin.  Use a skin protectant only as told by your health care provider.  Check your skin every day for any changes in color or any new blisters or sores (ulcers). Have a caregiver do this for you if you are not able. Medicines  Take over-the-counter and prescription medicines only as told by your health care provider.  If you were prescribed an antibiotic medicine, take it or apply it as told by your health care provider. Do not stop taking or using the antibiotic even if your condition improves. Reducing and Redistributing Pressure  Do not lie or sit in one position for a long time. Move or change position every two hours or as told by your health care provider.  Use pillows or cushions to reduce pressure. Ask your health care provider to recommend cushions or pads for you.  Use medical devices that do not rub your skin. Tell your health care provider if one of your medical devices is causing a pressure injury to develop. General instructions   Eat a healthy diet that includes lots of protein. Ask your health care provider for diet advice.  Drink enough fluid to keep your urine clear or pale yellow.  Be as active as you can every day. Ask your health care provider to suggest safe exercises or activities.  Do not abuse drugs or alcohol.  Keep all follow-up visits  as told by your health care provider. This is important.  Do not smoke. Contact a health care provider if:   You have chills or fever.  Your pain medicine is not helping.  You have any changes in skin color.  You have new blisters or sores.  You develop warmth, redness, or swelling near a pressure injury.  You have a bad odor or pus coming from your pressure injury.  You lose control of your bowels or bladder.  You develop new symptoms.  Your wound does not improve after 1-2 weeks of treatment.  You develop a new medical condition, such as diabetes, peripheral vascular disease, or conditions that affect your defense (immune) system. This information is not intended to replace advice given to you by your health care provider. Make sure you discuss any questions you have with your health care provider. Document Released: 05/24/2005 Document Revised: 10/27/2015 Document Reviewed: 10/02/2014 Elsevier Interactive Patient Education  2018  [employer].

## 2017-10-05 NOTE — Assessment & Plan Note (Signed)
Wt Readings from Last 3 Encounters:  10/05/17 216 lb (98 kg)  07/07/17 218 lb (98.9 kg)  05/11/17 214 lb (97.1 kg)

## 2017-10-05 NOTE — Assessment & Plan Note (Signed)
On B12 

## 2017-10-05 NOTE — Assessment & Plan Note (Signed)
Losartan, Verapamil

## 2017-10-12 NOTE — Addendum Note (Signed)
Addended by: Karren Cobble on: 10/12/2017 10:40 AM   Modules accepted: Orders

## 2017-10-21 ENCOUNTER — Telehealth: Payer: Self-pay | Admitting: Internal Medicine

## 2017-10-21 DIAGNOSIS — M1990 Unspecified osteoarthritis, unspecified site: Secondary | ICD-10-CM | POA: Diagnosis not present

## 2017-10-21 DIAGNOSIS — N183 Chronic kidney disease, stage 3 (moderate): Secondary | ICD-10-CM | POA: Diagnosis not present

## 2017-10-21 DIAGNOSIS — L89322 Pressure ulcer of left buttock, stage 2: Secondary | ICD-10-CM | POA: Diagnosis not present

## 2017-10-21 DIAGNOSIS — E1122 Type 2 diabetes mellitus with diabetic chronic kidney disease: Secondary | ICD-10-CM | POA: Diagnosis not present

## 2017-10-21 DIAGNOSIS — M109 Gout, unspecified: Secondary | ICD-10-CM | POA: Diagnosis not present

## 2017-10-21 DIAGNOSIS — I129 Hypertensive chronic kidney disease with stage 1 through stage 4 chronic kidney disease, or unspecified chronic kidney disease: Secondary | ICD-10-CM | POA: Diagnosis not present

## 2017-10-21 DIAGNOSIS — R1310 Dysphagia, unspecified: Secondary | ICD-10-CM | POA: Diagnosis not present

## 2017-10-21 DIAGNOSIS — K219 Gastro-esophageal reflux disease without esophagitis: Secondary | ICD-10-CM | POA: Diagnosis not present

## 2017-10-21 NOTE — Telephone Encounter (Signed)
Ok Thx 

## 2017-10-21 NOTE — Telephone Encounter (Signed)
Copied from [place] [phone]. Topic: Quick Communication - See Telephone Encounter >> Oct 21, 2017  1:23 PM Arletha Grippe wrote: CRM for notification. See Telephone encounter for: 10/21/17. Barrie Folk from [facility]  Verbal order -  Continued skilled nursing, for wound care, diabetes mgmt 1 week 1  2 week 3  1 wwek 5   Cb is [phone]  Pt has rash to right and left groin site.  Asking for order nyastatin powder [facility] [place]

## 2017-10-23 DIAGNOSIS — M109 Gout, unspecified: Secondary | ICD-10-CM | POA: Diagnosis not present

## 2017-10-23 DIAGNOSIS — R1310 Dysphagia, unspecified: Secondary | ICD-10-CM | POA: Diagnosis not present

## 2017-10-23 DIAGNOSIS — E1122 Type 2 diabetes mellitus with diabetic chronic kidney disease: Secondary | ICD-10-CM | POA: Diagnosis not present

## 2017-10-23 DIAGNOSIS — L89322 Pressure ulcer of left buttock, stage 2: Secondary | ICD-10-CM | POA: Diagnosis not present

## 2017-10-23 DIAGNOSIS — N183 Chronic kidney disease, stage 3 (moderate): Secondary | ICD-10-CM | POA: Diagnosis not present

## 2017-10-23 DIAGNOSIS — K219 Gastro-esophageal reflux disease without esophagitis: Secondary | ICD-10-CM | POA: Diagnosis not present

## 2017-10-23 DIAGNOSIS — I129 Hypertensive chronic kidney disease with stage 1 through stage 4 chronic kidney disease, or unspecified chronic kidney disease: Secondary | ICD-10-CM | POA: Diagnosis not present

## 2017-10-23 DIAGNOSIS — M1990 Unspecified osteoarthritis, unspecified site: Secondary | ICD-10-CM | POA: Diagnosis not present

## 2017-10-24 MED ORDER — NYSTATIN 100000 UNIT/GM EX POWD: CUTANEOUS | 0 refills | Status: DC

## 2017-10-24 NOTE — Telephone Encounter (Signed)
Notified Holly Banks w/MD response. Sent rx to [facility].Marland KitchenJohny Chess

## 2017-10-25 DIAGNOSIS — M1990 Unspecified osteoarthritis, unspecified site: Secondary | ICD-10-CM | POA: Diagnosis not present

## 2017-10-25 DIAGNOSIS — E1122 Type 2 diabetes mellitus with diabetic chronic kidney disease: Secondary | ICD-10-CM | POA: Diagnosis not present

## 2017-10-25 DIAGNOSIS — K219 Gastro-esophageal reflux disease without esophagitis: Secondary | ICD-10-CM | POA: Diagnosis not present

## 2017-10-25 DIAGNOSIS — R1310 Dysphagia, unspecified: Secondary | ICD-10-CM | POA: Diagnosis not present

## 2017-10-25 DIAGNOSIS — N183 Chronic kidney disease, stage 3 (moderate): Secondary | ICD-10-CM | POA: Diagnosis not present

## 2017-10-25 DIAGNOSIS — I129 Hypertensive chronic kidney disease with stage 1 through stage 4 chronic kidney disease, or unspecified chronic kidney disease: Secondary | ICD-10-CM | POA: Diagnosis not present

## 2017-10-25 DIAGNOSIS — M109 Gout, unspecified: Secondary | ICD-10-CM | POA: Diagnosis not present

## 2017-10-25 DIAGNOSIS — L89322 Pressure ulcer of left buttock, stage 2: Secondary | ICD-10-CM | POA: Diagnosis not present

## 2017-10-27 DIAGNOSIS — E1122 Type 2 diabetes mellitus with diabetic chronic kidney disease: Secondary | ICD-10-CM | POA: Diagnosis not present

## 2017-10-27 DIAGNOSIS — I129 Hypertensive chronic kidney disease with stage 1 through stage 4 chronic kidney disease, or unspecified chronic kidney disease: Secondary | ICD-10-CM | POA: Diagnosis not present

## 2017-10-27 DIAGNOSIS — M109 Gout, unspecified: Secondary | ICD-10-CM | POA: Diagnosis not present

## 2017-10-27 DIAGNOSIS — M1990 Unspecified osteoarthritis, unspecified site: Secondary | ICD-10-CM | POA: Diagnosis not present

## 2017-10-27 DIAGNOSIS — L89322 Pressure ulcer of left buttock, stage 2: Secondary | ICD-10-CM | POA: Diagnosis not present

## 2017-10-27 DIAGNOSIS — R1310 Dysphagia, unspecified: Secondary | ICD-10-CM | POA: Diagnosis not present

## 2017-10-27 DIAGNOSIS — K219 Gastro-esophageal reflux disease without esophagitis: Secondary | ICD-10-CM | POA: Diagnosis not present

## 2017-10-27 DIAGNOSIS — N183 Chronic kidney disease, stage 3 (moderate): Secondary | ICD-10-CM | POA: Diagnosis not present

## 2017-10-31 DIAGNOSIS — L89322 Pressure ulcer of left buttock, stage 2: Secondary | ICD-10-CM | POA: Diagnosis not present

## 2017-10-31 DIAGNOSIS — E1122 Type 2 diabetes mellitus with diabetic chronic kidney disease: Secondary | ICD-10-CM | POA: Diagnosis not present

## 2017-10-31 DIAGNOSIS — N183 Chronic kidney disease, stage 3 (moderate): Secondary | ICD-10-CM | POA: Diagnosis not present

## 2017-10-31 DIAGNOSIS — M109 Gout, unspecified: Secondary | ICD-10-CM | POA: Diagnosis not present

## 2017-10-31 DIAGNOSIS — M1990 Unspecified osteoarthritis, unspecified site: Secondary | ICD-10-CM | POA: Diagnosis not present

## 2017-10-31 DIAGNOSIS — K219 Gastro-esophageal reflux disease without esophagitis: Secondary | ICD-10-CM | POA: Diagnosis not present

## 2017-10-31 DIAGNOSIS — R1310 Dysphagia, unspecified: Secondary | ICD-10-CM | POA: Diagnosis not present

## 2017-10-31 DIAGNOSIS — I129 Hypertensive chronic kidney disease with stage 1 through stage 4 chronic kidney disease, or unspecified chronic kidney disease: Secondary | ICD-10-CM | POA: Diagnosis not present

## 2017-11-01 ENCOUNTER — Other Ambulatory Visit: Payer: Self-pay | Admitting: Family

## 2017-11-01 DIAGNOSIS — L89322 Pressure ulcer of left buttock, stage 2: Secondary | ICD-10-CM | POA: Diagnosis not present

## 2017-11-01 DIAGNOSIS — K219 Gastro-esophageal reflux disease without esophagitis: Secondary | ICD-10-CM | POA: Diagnosis not present

## 2017-11-01 DIAGNOSIS — R1310 Dysphagia, unspecified: Secondary | ICD-10-CM | POA: Diagnosis not present

## 2017-11-01 DIAGNOSIS — E1122 Type 2 diabetes mellitus with diabetic chronic kidney disease: Secondary | ICD-10-CM | POA: Diagnosis not present

## 2017-11-01 DIAGNOSIS — M109 Gout, unspecified: Secondary | ICD-10-CM | POA: Diagnosis not present

## 2017-11-01 DIAGNOSIS — M1990 Unspecified osteoarthritis, unspecified site: Secondary | ICD-10-CM | POA: Diagnosis not present

## 2017-11-01 DIAGNOSIS — I129 Hypertensive chronic kidney disease with stage 1 through stage 4 chronic kidney disease, or unspecified chronic kidney disease: Secondary | ICD-10-CM | POA: Diagnosis not present

## 2017-11-01 DIAGNOSIS — N183 Chronic kidney disease, stage 3 (moderate): Secondary | ICD-10-CM | POA: Diagnosis not present

## 2017-11-03 DIAGNOSIS — N183 Chronic kidney disease, stage 3 (moderate): Secondary | ICD-10-CM | POA: Diagnosis not present

## 2017-11-03 DIAGNOSIS — I129 Hypertensive chronic kidney disease with stage 1 through stage 4 chronic kidney disease, or unspecified chronic kidney disease: Secondary | ICD-10-CM | POA: Diagnosis not present

## 2017-11-03 DIAGNOSIS — K219 Gastro-esophageal reflux disease without esophagitis: Secondary | ICD-10-CM | POA: Diagnosis not present

## 2017-11-03 DIAGNOSIS — L89322 Pressure ulcer of left buttock, stage 2: Secondary | ICD-10-CM | POA: Diagnosis not present

## 2017-11-03 DIAGNOSIS — M109 Gout, unspecified: Secondary | ICD-10-CM | POA: Diagnosis not present

## 2017-11-03 DIAGNOSIS — M1990 Unspecified osteoarthritis, unspecified site: Secondary | ICD-10-CM | POA: Diagnosis not present

## 2017-11-03 DIAGNOSIS — R1310 Dysphagia, unspecified: Secondary | ICD-10-CM | POA: Diagnosis not present

## 2017-11-03 DIAGNOSIS — E1122 Type 2 diabetes mellitus with diabetic chronic kidney disease: Secondary | ICD-10-CM | POA: Diagnosis not present

## 2017-11-04 DIAGNOSIS — M109 Gout, unspecified: Secondary | ICD-10-CM | POA: Diagnosis not present

## 2017-11-04 DIAGNOSIS — N183 Chronic kidney disease, stage 3 (moderate): Secondary | ICD-10-CM | POA: Diagnosis not present

## 2017-11-04 DIAGNOSIS — K219 Gastro-esophageal reflux disease without esophagitis: Secondary | ICD-10-CM | POA: Diagnosis not present

## 2017-11-04 DIAGNOSIS — I129 Hypertensive chronic kidney disease with stage 1 through stage 4 chronic kidney disease, or unspecified chronic kidney disease: Secondary | ICD-10-CM | POA: Diagnosis not present

## 2017-11-04 DIAGNOSIS — L89322 Pressure ulcer of left buttock, stage 2: Secondary | ICD-10-CM | POA: Diagnosis not present

## 2017-11-04 DIAGNOSIS — M1990 Unspecified osteoarthritis, unspecified site: Secondary | ICD-10-CM | POA: Diagnosis not present

## 2017-11-04 DIAGNOSIS — E1122 Type 2 diabetes mellitus with diabetic chronic kidney disease: Secondary | ICD-10-CM | POA: Diagnosis not present

## 2017-11-04 DIAGNOSIS — R1310 Dysphagia, unspecified: Secondary | ICD-10-CM | POA: Diagnosis not present

## 2017-11-08 DIAGNOSIS — L89322 Pressure ulcer of left buttock, stage 2: Secondary | ICD-10-CM | POA: Diagnosis not present

## 2017-11-08 DIAGNOSIS — E1122 Type 2 diabetes mellitus with diabetic chronic kidney disease: Secondary | ICD-10-CM | POA: Diagnosis not present

## 2017-11-08 DIAGNOSIS — K219 Gastro-esophageal reflux disease without esophagitis: Secondary | ICD-10-CM | POA: Diagnosis not present

## 2017-11-08 DIAGNOSIS — N183 Chronic kidney disease, stage 3 (moderate): Secondary | ICD-10-CM | POA: Diagnosis not present

## 2017-11-08 DIAGNOSIS — R1310 Dysphagia, unspecified: Secondary | ICD-10-CM | POA: Diagnosis not present

## 2017-11-08 DIAGNOSIS — M1990 Unspecified osteoarthritis, unspecified site: Secondary | ICD-10-CM | POA: Diagnosis not present

## 2017-11-08 DIAGNOSIS — I129 Hypertensive chronic kidney disease with stage 1 through stage 4 chronic kidney disease, or unspecified chronic kidney disease: Secondary | ICD-10-CM | POA: Diagnosis not present

## 2017-11-08 DIAGNOSIS — M109 Gout, unspecified: Secondary | ICD-10-CM | POA: Diagnosis not present

## 2017-11-09 DIAGNOSIS — R1310 Dysphagia, unspecified: Secondary | ICD-10-CM | POA: Diagnosis not present

## 2017-11-09 DIAGNOSIS — L89322 Pressure ulcer of left buttock, stage 2: Secondary | ICD-10-CM | POA: Diagnosis not present

## 2017-11-09 DIAGNOSIS — M1990 Unspecified osteoarthritis, unspecified site: Secondary | ICD-10-CM | POA: Diagnosis not present

## 2017-11-09 DIAGNOSIS — I129 Hypertensive chronic kidney disease with stage 1 through stage 4 chronic kidney disease, or unspecified chronic kidney disease: Secondary | ICD-10-CM | POA: Diagnosis not present

## 2017-11-09 DIAGNOSIS — K219 Gastro-esophageal reflux disease without esophagitis: Secondary | ICD-10-CM | POA: Diagnosis not present

## 2017-11-09 DIAGNOSIS — N183 Chronic kidney disease, stage 3 (moderate): Secondary | ICD-10-CM | POA: Diagnosis not present

## 2017-11-09 DIAGNOSIS — E1122 Type 2 diabetes mellitus with diabetic chronic kidney disease: Secondary | ICD-10-CM | POA: Diagnosis not present

## 2017-11-09 DIAGNOSIS — M109 Gout, unspecified: Secondary | ICD-10-CM | POA: Diagnosis not present

## 2017-11-10 DIAGNOSIS — I129 Hypertensive chronic kidney disease with stage 1 through stage 4 chronic kidney disease, or unspecified chronic kidney disease: Secondary | ICD-10-CM | POA: Diagnosis not present

## 2017-11-10 DIAGNOSIS — R1310 Dysphagia, unspecified: Secondary | ICD-10-CM | POA: Diagnosis not present

## 2017-11-10 DIAGNOSIS — M109 Gout, unspecified: Secondary | ICD-10-CM | POA: Diagnosis not present

## 2017-11-10 DIAGNOSIS — L89322 Pressure ulcer of left buttock, stage 2: Secondary | ICD-10-CM | POA: Diagnosis not present

## 2017-11-10 DIAGNOSIS — K219 Gastro-esophageal reflux disease without esophagitis: Secondary | ICD-10-CM | POA: Diagnosis not present

## 2017-11-10 DIAGNOSIS — E1122 Type 2 diabetes mellitus with diabetic chronic kidney disease: Secondary | ICD-10-CM | POA: Diagnosis not present

## 2017-11-10 DIAGNOSIS — M1990 Unspecified osteoarthritis, unspecified site: Secondary | ICD-10-CM | POA: Diagnosis not present

## 2017-11-10 DIAGNOSIS — N183 Chronic kidney disease, stage 3 (moderate): Secondary | ICD-10-CM | POA: Diagnosis not present

## 2017-11-11 DIAGNOSIS — M109 Gout, unspecified: Secondary | ICD-10-CM | POA: Diagnosis not present

## 2017-11-11 DIAGNOSIS — N183 Chronic kidney disease, stage 3 (moderate): Secondary | ICD-10-CM | POA: Diagnosis not present

## 2017-11-11 DIAGNOSIS — M1990 Unspecified osteoarthritis, unspecified site: Secondary | ICD-10-CM | POA: Diagnosis not present

## 2017-11-11 DIAGNOSIS — K219 Gastro-esophageal reflux disease without esophagitis: Secondary | ICD-10-CM | POA: Diagnosis not present

## 2017-11-11 DIAGNOSIS — Z7984 Long term (current) use of oral hypoglycemic drugs: Secondary | ICD-10-CM | POA: Diagnosis not present

## 2017-11-11 DIAGNOSIS — Z7982 Long term (current) use of aspirin: Secondary | ICD-10-CM | POA: Diagnosis not present

## 2017-11-11 DIAGNOSIS — R1310 Dysphagia, unspecified: Secondary | ICD-10-CM | POA: Diagnosis not present

## 2017-11-11 DIAGNOSIS — L89322 Pressure ulcer of left buttock, stage 2: Secondary | ICD-10-CM | POA: Diagnosis not present

## 2017-11-11 DIAGNOSIS — I129 Hypertensive chronic kidney disease with stage 1 through stage 4 chronic kidney disease, or unspecified chronic kidney disease: Secondary | ICD-10-CM | POA: Diagnosis not present

## 2017-11-11 DIAGNOSIS — Z87891 Personal history of nicotine dependence: Secondary | ICD-10-CM | POA: Diagnosis not present

## 2017-11-11 DIAGNOSIS — E1122 Type 2 diabetes mellitus with diabetic chronic kidney disease: Secondary | ICD-10-CM | POA: Diagnosis not present

## 2017-11-15 DIAGNOSIS — K219 Gastro-esophageal reflux disease without esophagitis: Secondary | ICD-10-CM | POA: Diagnosis not present

## 2017-11-15 DIAGNOSIS — M1990 Unspecified osteoarthritis, unspecified site: Secondary | ICD-10-CM | POA: Diagnosis not present

## 2017-11-15 DIAGNOSIS — R1310 Dysphagia, unspecified: Secondary | ICD-10-CM | POA: Diagnosis not present

## 2017-11-15 DIAGNOSIS — L89322 Pressure ulcer of left buttock, stage 2: Secondary | ICD-10-CM | POA: Diagnosis not present

## 2017-11-15 DIAGNOSIS — E1122 Type 2 diabetes mellitus with diabetic chronic kidney disease: Secondary | ICD-10-CM | POA: Diagnosis not present

## 2017-11-15 DIAGNOSIS — I129 Hypertensive chronic kidney disease with stage 1 through stage 4 chronic kidney disease, or unspecified chronic kidney disease: Secondary | ICD-10-CM | POA: Diagnosis not present

## 2017-11-15 DIAGNOSIS — M109 Gout, unspecified: Secondary | ICD-10-CM | POA: Diagnosis not present

## 2017-11-15 DIAGNOSIS — N183 Chronic kidney disease, stage 3 (moderate): Secondary | ICD-10-CM | POA: Diagnosis not present

## 2017-11-17 DIAGNOSIS — L89322 Pressure ulcer of left buttock, stage 2: Secondary | ICD-10-CM | POA: Diagnosis not present

## 2017-11-17 DIAGNOSIS — M109 Gout, unspecified: Secondary | ICD-10-CM | POA: Diagnosis not present

## 2017-11-17 DIAGNOSIS — E1122 Type 2 diabetes mellitus with diabetic chronic kidney disease: Secondary | ICD-10-CM | POA: Diagnosis not present

## 2017-11-17 DIAGNOSIS — N183 Chronic kidney disease, stage 3 (moderate): Secondary | ICD-10-CM | POA: Diagnosis not present

## 2017-11-17 DIAGNOSIS — I129 Hypertensive chronic kidney disease with stage 1 through stage 4 chronic kidney disease, or unspecified chronic kidney disease: Secondary | ICD-10-CM | POA: Diagnosis not present

## 2017-11-17 DIAGNOSIS — K219 Gastro-esophageal reflux disease without esophagitis: Secondary | ICD-10-CM | POA: Diagnosis not present

## 2017-11-17 DIAGNOSIS — M1990 Unspecified osteoarthritis, unspecified site: Secondary | ICD-10-CM | POA: Diagnosis not present

## 2017-11-17 DIAGNOSIS — R1310 Dysphagia, unspecified: Secondary | ICD-10-CM | POA: Diagnosis not present

## 2017-11-21 DIAGNOSIS — M1990 Unspecified osteoarthritis, unspecified site: Secondary | ICD-10-CM | POA: Diagnosis not present

## 2017-11-21 DIAGNOSIS — M109 Gout, unspecified: Secondary | ICD-10-CM | POA: Diagnosis not present

## 2017-11-21 DIAGNOSIS — I129 Hypertensive chronic kidney disease with stage 1 through stage 4 chronic kidney disease, or unspecified chronic kidney disease: Secondary | ICD-10-CM | POA: Diagnosis not present

## 2017-11-21 DIAGNOSIS — R1310 Dysphagia, unspecified: Secondary | ICD-10-CM | POA: Diagnosis not present

## 2017-11-21 DIAGNOSIS — E1122 Type 2 diabetes mellitus with diabetic chronic kidney disease: Secondary | ICD-10-CM | POA: Diagnosis not present

## 2017-11-21 DIAGNOSIS — N183 Chronic kidney disease, stage 3 (moderate): Secondary | ICD-10-CM | POA: Diagnosis not present

## 2017-11-21 DIAGNOSIS — K219 Gastro-esophageal reflux disease without esophagitis: Secondary | ICD-10-CM | POA: Diagnosis not present

## 2017-11-21 DIAGNOSIS — L89322 Pressure ulcer of left buttock, stage 2: Secondary | ICD-10-CM | POA: Diagnosis not present

## 2017-11-22 ENCOUNTER — Telehealth: Payer: Self-pay | Admitting: Internal Medicine

## 2017-11-22 ENCOUNTER — Other Ambulatory Visit: Payer: Self-pay | Admitting: Family

## 2017-11-22 NOTE — Telephone Encounter (Signed)
Please advise 

## 2017-11-22 NOTE — Telephone Encounter (Signed)
Copied from [place] [phone]. Topic: Quick Communication - See Telephone Encounter >> Nov 21, 2017  3:39 PM Antonieta Iba C wrote: CRM for notification. See Telephone encounter for: 11/21/17.  Pt sugar has been elevated. Blood sugar today was 215 yesterday 252 and on 6/14 is 202 before she eat anything.   [place]

## 2017-11-23 DIAGNOSIS — M1990 Unspecified osteoarthritis, unspecified site: Secondary | ICD-10-CM | POA: Diagnosis not present

## 2017-11-23 DIAGNOSIS — K219 Gastro-esophageal reflux disease without esophagitis: Secondary | ICD-10-CM | POA: Diagnosis not present

## 2017-11-23 DIAGNOSIS — L89322 Pressure ulcer of left buttock, stage 2: Secondary | ICD-10-CM | POA: Diagnosis not present

## 2017-11-23 DIAGNOSIS — N183 Chronic kidney disease, stage 3 (moderate): Secondary | ICD-10-CM | POA: Diagnosis not present

## 2017-11-23 DIAGNOSIS — E1122 Type 2 diabetes mellitus with diabetic chronic kidney disease: Secondary | ICD-10-CM | POA: Diagnosis not present

## 2017-11-23 DIAGNOSIS — M109 Gout, unspecified: Secondary | ICD-10-CM | POA: Diagnosis not present

## 2017-11-23 DIAGNOSIS — R1310 Dysphagia, unspecified: Secondary | ICD-10-CM | POA: Diagnosis not present

## 2017-11-23 DIAGNOSIS — I129 Hypertensive chronic kidney disease with stage 1 through stage 4 chronic kidney disease, or unspecified chronic kidney disease: Secondary | ICD-10-CM | POA: Diagnosis not present

## 2017-11-23 MED ORDER — REPAGLINIDE 1 MG PO TABS: ORAL_TABLET | ORAL | 1 refills | Status: DC

## 2017-11-23 MED ORDER — REPAGLINIDE 1 MG PO TABS: ORAL_TABLET | ORAL | 0 refills | Status: DC

## 2017-11-23 NOTE — Telephone Encounter (Signed)
Is she taking 1 mg Prandin tid ac? If yes - take 2 mg tid Thx

## 2017-11-23 NOTE — Telephone Encounter (Signed)
Bernadette Hoit asked MD response. Nurse was not sure, but she stated it does not sound like a medication that she is currently taking. Inform will contact pt to verify if she has been taking. Called pt verify if she has been taking the prandin 1 mg. Per pt she has not been taking. Only been taking the metformin twice a day. Inform pt MD want her to take the Prandin will send the 1 mg since she has not been taking. Sent 2 week supply local pharmacy and rx to [facility] per pt request.../lmb

## 2017-11-29 DIAGNOSIS — M1990 Unspecified osteoarthritis, unspecified site: Secondary | ICD-10-CM | POA: Diagnosis not present

## 2017-11-29 DIAGNOSIS — L89322 Pressure ulcer of left buttock, stage 2: Secondary | ICD-10-CM | POA: Diagnosis not present

## 2017-11-29 DIAGNOSIS — I129 Hypertensive chronic kidney disease with stage 1 through stage 4 chronic kidney disease, or unspecified chronic kidney disease: Secondary | ICD-10-CM | POA: Diagnosis not present

## 2017-11-29 DIAGNOSIS — N183 Chronic kidney disease, stage 3 (moderate): Secondary | ICD-10-CM | POA: Diagnosis not present

## 2017-11-29 DIAGNOSIS — K219 Gastro-esophageal reflux disease without esophagitis: Secondary | ICD-10-CM | POA: Diagnosis not present

## 2017-11-29 DIAGNOSIS — R1310 Dysphagia, unspecified: Secondary | ICD-10-CM | POA: Diagnosis not present

## 2017-11-29 DIAGNOSIS — M109 Gout, unspecified: Secondary | ICD-10-CM | POA: Diagnosis not present

## 2017-11-29 DIAGNOSIS — E1122 Type 2 diabetes mellitus with diabetic chronic kidney disease: Secondary | ICD-10-CM | POA: Diagnosis not present

## 2017-12-01 DIAGNOSIS — E1122 Type 2 diabetes mellitus with diabetic chronic kidney disease: Secondary | ICD-10-CM | POA: Diagnosis not present

## 2017-12-01 DIAGNOSIS — R1310 Dysphagia, unspecified: Secondary | ICD-10-CM | POA: Diagnosis not present

## 2017-12-01 DIAGNOSIS — I129 Hypertensive chronic kidney disease with stage 1 through stage 4 chronic kidney disease, or unspecified chronic kidney disease: Secondary | ICD-10-CM | POA: Diagnosis not present

## 2017-12-01 DIAGNOSIS — L89322 Pressure ulcer of left buttock, stage 2: Secondary | ICD-10-CM | POA: Diagnosis not present

## 2017-12-01 DIAGNOSIS — M1990 Unspecified osteoarthritis, unspecified site: Secondary | ICD-10-CM | POA: Diagnosis not present

## 2017-12-01 DIAGNOSIS — M109 Gout, unspecified: Secondary | ICD-10-CM | POA: Diagnosis not present

## 2017-12-01 DIAGNOSIS — K219 Gastro-esophageal reflux disease without esophagitis: Secondary | ICD-10-CM | POA: Diagnosis not present

## 2017-12-01 DIAGNOSIS — N183 Chronic kidney disease, stage 3 (moderate): Secondary | ICD-10-CM | POA: Diagnosis not present

## 2017-12-06 DIAGNOSIS — L89322 Pressure ulcer of left buttock, stage 2: Secondary | ICD-10-CM | POA: Diagnosis not present

## 2017-12-06 DIAGNOSIS — M109 Gout, unspecified: Secondary | ICD-10-CM | POA: Diagnosis not present

## 2017-12-06 DIAGNOSIS — I129 Hypertensive chronic kidney disease with stage 1 through stage 4 chronic kidney disease, or unspecified chronic kidney disease: Secondary | ICD-10-CM | POA: Diagnosis not present

## 2017-12-06 DIAGNOSIS — E1122 Type 2 diabetes mellitus with diabetic chronic kidney disease: Secondary | ICD-10-CM | POA: Diagnosis not present

## 2017-12-06 DIAGNOSIS — R1310 Dysphagia, unspecified: Secondary | ICD-10-CM | POA: Diagnosis not present

## 2017-12-06 DIAGNOSIS — M1990 Unspecified osteoarthritis, unspecified site: Secondary | ICD-10-CM | POA: Diagnosis not present

## 2017-12-06 DIAGNOSIS — K219 Gastro-esophageal reflux disease without esophagitis: Secondary | ICD-10-CM | POA: Diagnosis not present

## 2017-12-06 DIAGNOSIS — N183 Chronic kidney disease, stage 3 (moderate): Secondary | ICD-10-CM | POA: Diagnosis not present

## 2017-12-09 DIAGNOSIS — I129 Hypertensive chronic kidney disease with stage 1 through stage 4 chronic kidney disease, or unspecified chronic kidney disease: Secondary | ICD-10-CM | POA: Diagnosis not present

## 2017-12-09 DIAGNOSIS — M1990 Unspecified osteoarthritis, unspecified site: Secondary | ICD-10-CM | POA: Diagnosis not present

## 2017-12-09 DIAGNOSIS — K219 Gastro-esophageal reflux disease without esophagitis: Secondary | ICD-10-CM | POA: Diagnosis not present

## 2017-12-09 DIAGNOSIS — M109 Gout, unspecified: Secondary | ICD-10-CM | POA: Diagnosis not present

## 2017-12-09 DIAGNOSIS — R1310 Dysphagia, unspecified: Secondary | ICD-10-CM | POA: Diagnosis not present

## 2017-12-09 DIAGNOSIS — L89322 Pressure ulcer of left buttock, stage 2: Secondary | ICD-10-CM | POA: Diagnosis not present

## 2017-12-09 DIAGNOSIS — N183 Chronic kidney disease, stage 3 (moderate): Secondary | ICD-10-CM | POA: Diagnosis not present

## 2017-12-09 DIAGNOSIS — E1122 Type 2 diabetes mellitus with diabetic chronic kidney disease: Secondary | ICD-10-CM | POA: Diagnosis not present

## 2017-12-12 ENCOUNTER — Telehealth: Payer: Self-pay | Admitting: Internal Medicine

## 2017-12-12 DIAGNOSIS — L89322 Pressure ulcer of left buttock, stage 2: Secondary | ICD-10-CM | POA: Diagnosis not present

## 2017-12-12 DIAGNOSIS — M1990 Unspecified osteoarthritis, unspecified site: Secondary | ICD-10-CM | POA: Diagnosis not present

## 2017-12-12 DIAGNOSIS — R1310 Dysphagia, unspecified: Secondary | ICD-10-CM | POA: Diagnosis not present

## 2017-12-12 DIAGNOSIS — I129 Hypertensive chronic kidney disease with stage 1 through stage 4 chronic kidney disease, or unspecified chronic kidney disease: Secondary | ICD-10-CM | POA: Diagnosis not present

## 2017-12-12 DIAGNOSIS — E1122 Type 2 diabetes mellitus with diabetic chronic kidney disease: Secondary | ICD-10-CM | POA: Diagnosis not present

## 2017-12-12 DIAGNOSIS — M109 Gout, unspecified: Secondary | ICD-10-CM | POA: Diagnosis not present

## 2017-12-12 DIAGNOSIS — N183 Chronic kidney disease, stage 3 (moderate): Secondary | ICD-10-CM | POA: Diagnosis not present

## 2017-12-12 DIAGNOSIS — K219 Gastro-esophageal reflux disease without esophagitis: Secondary | ICD-10-CM | POA: Diagnosis not present

## 2017-12-12 NOTE — Telephone Encounter (Signed)
Copied from Bernie [phone]. Topic: Quick Communication - See Telephone Encounter >> Dec 12, 2017  2:21 PM Bea Graff, NT wrote: CRM for notification. See Telephone encounter for: 12/12/17. [place] needing verbal orders for a gel cushion for this pt recliner. CB#: [phone]

## 2017-12-12 NOTE — Telephone Encounter (Signed)
Verbals given FYI 

## 2017-12-15 DIAGNOSIS — K219 Gastro-esophageal reflux disease without esophagitis: Secondary | ICD-10-CM | POA: Diagnosis not present

## 2017-12-15 DIAGNOSIS — E1122 Type 2 diabetes mellitus with diabetic chronic kidney disease: Secondary | ICD-10-CM | POA: Diagnosis not present

## 2017-12-15 DIAGNOSIS — N183 Chronic kidney disease, stage 3 (moderate): Secondary | ICD-10-CM | POA: Diagnosis not present

## 2017-12-15 DIAGNOSIS — M1990 Unspecified osteoarthritis, unspecified site: Secondary | ICD-10-CM | POA: Diagnosis not present

## 2017-12-15 DIAGNOSIS — I129 Hypertensive chronic kidney disease with stage 1 through stage 4 chronic kidney disease, or unspecified chronic kidney disease: Secondary | ICD-10-CM | POA: Diagnosis not present

## 2017-12-15 DIAGNOSIS — R1310 Dysphagia, unspecified: Secondary | ICD-10-CM | POA: Diagnosis not present

## 2017-12-15 DIAGNOSIS — L89322 Pressure ulcer of left buttock, stage 2: Secondary | ICD-10-CM | POA: Diagnosis not present

## 2017-12-15 DIAGNOSIS — M109 Gout, unspecified: Secondary | ICD-10-CM | POA: Diagnosis not present

## 2017-12-21 ENCOUNTER — Other Ambulatory Visit: Payer: Self-pay | Admitting: Internal Medicine

## 2018-01-04 ENCOUNTER — Ambulatory Visit (INDEPENDENT_AMBULATORY_CARE_PROVIDER_SITE_OTHER): Payer: Medicare HMO | Admitting: Internal Medicine

## 2018-01-04 ENCOUNTER — Other Ambulatory Visit (INDEPENDENT_AMBULATORY_CARE_PROVIDER_SITE_OTHER): Payer: Medicare HMO

## 2018-01-04 ENCOUNTER — Encounter: Payer: Self-pay | Admitting: Internal Medicine

## 2018-01-04 DIAGNOSIS — N183 Chronic kidney disease, stage 3 unspecified: Secondary | ICD-10-CM

## 2018-01-04 DIAGNOSIS — R269 Unspecified abnormalities of gait and mobility: Secondary | ICD-10-CM

## 2018-01-04 DIAGNOSIS — R251 Tremor, unspecified: Secondary | ICD-10-CM | POA: Diagnosis not present

## 2018-01-04 DIAGNOSIS — E1122 Type 2 diabetes mellitus with diabetic chronic kidney disease: Secondary | ICD-10-CM | POA: Diagnosis not present

## 2018-01-04 DIAGNOSIS — I10 Essential (primary) hypertension: Secondary | ICD-10-CM

## 2018-01-04 LAB — BASIC METABOLIC PANEL
BUN: 16 mg/dL (ref 6–23)
CALCIUM: 10.1 mg/dL (ref 8.4–10.5)
CO2: 29 mEq/L (ref 19–32)
Chloride: 103 mEq/L (ref 96–112)
Creatinine, Ser: 0.83 mg/dL (ref 0.40–1.20)
GFR: 83.55 mL/min (ref >60.00)
Glucose, Bld: 157 mg/dL — ABNORMAL HIGH (ref 70–99)
Potassium: 3.9 mEq/L (ref 3.5–5.1)
Sodium: 141 mEq/L (ref 135–145)

## 2018-01-04 LAB — HEMOGLOBIN A1C: Hgb A1c MFr Bld: 5.7 % (ref 4.6–6.5)

## 2018-01-04 MED ORDER — PRIMIDONE 50 MG PO TABS: 50.0000 mg | ORAL_TABLET | Freq: Three times a day (TID) | ORAL | 3 refills | Status: DC

## 2018-01-04 NOTE — Assessment & Plan Note (Signed)
Worse: start Primidone low dose

## 2018-01-04 NOTE — Assessment & Plan Note (Signed)
Walker

## 2018-01-04 NOTE — Assessment & Plan Note (Signed)
Prandin, Metformin Labs 

## 2018-01-04 NOTE — Assessment & Plan Note (Signed)
BP Readings from Last 3 Encounters:  01/04/18 136/70  10/05/17 138/68  07/07/17 (!) 164/80

## 2018-01-04 NOTE — Progress Notes (Signed)
Subjective:  Patient ID: Holly Banks, female    DOB: 1928-11-12  Age: 84 y.o. MRN: [id number]  CC: No chief complaint on file.   HPI Pearley Millington presents for HTN, OA, DM f/u C/o hands tremors - worse  Outpatient Medications Prior to Visit  Medication Sig Dispense Refill  . aspirin 81 MG tablet Take 81 mg by mouth daily.    . Cholecalciferol (EQL VITAMIN D3) 1000 UNITS tablet Take 1,000 Units by mouth daily. For supplement    . hydrOXYzine (ATARAX/VISTARIL) 10 MG tablet Take 1 tablet (10 mg total) by mouth 3 (three) times daily as needed for itching. 180 tablet 1  . losartan (COZAAR) 50 MG tablet TAKE 1 TABLET EVERY DAY  FOR  HYPERTENSION 90 tablet 1  . meloxicam (MOBIC) 7.5 MG tablet TAKE 1 TABLET(7.5 MG) BY MOUTH DAILY 90 tablet 0  . metFORMIN (GLUCOPHAGE) 500 MG tablet TAKE 1 TABLET TWICE DAILY WITH MEALS FOR DIABETES 180 tablet 1  . naproxen sodium (ANAPROX) 220 MG tablet Take 440 mg by mouth as needed.    . Nutritional Supplements (ENSURE ORIGINAL) LIQD Take 1-3 Bottles by mouth daily. 90 Bottle 3  . potassium chloride (K-DUR,KLOR-CON) 10 MEQ tablet TAKE 1 TABLET EVERY DAY 90 tablet 1  . verapamil (VERELAN PM) 360 MG 24 hr capsule TAKE 1 CAPSULE AT BEDTIME  FOR  HYPERTENSION 90 capsule 1  . vitamin B-12 (CYANOCOBALAMIN) 1000 MCG tablet Take 1,000 mcg by mouth daily.      [name] nystatin (MYCOSTATIN/NYSTOP) powder Apply to area twice a day as needed (Patient not taking: Reported on 01/04/2018) 30 g 0  . repaglinide (PRANDIN) 1 MG tablet 1 tab ac qd-tid. Use Prandin (repaglinide) only if you eat (Patient not taking: Reported on 01/04/2018) 42 tablet 0   No facility-administered medications prior to visit.     ROS: Review of Systems  Constitutional: Positive for fatigue. Negative for activity change, appetite change, chills and unexpected weight change.  HENT: Negative for congestion, mouth sores and sinus pressure.   Eyes: Negative for visual disturbance.  Respiratory:  Negative for cough and chest tightness.   Gastrointestinal: Negative for abdominal pain and nausea.  Genitourinary: Negative for difficulty urinating, frequency and vaginal pain.  Musculoskeletal: Positive for arthralgias, back pain and gait problem.  Skin: Negative for pallor and rash.  Neurological: Positive for tremors. Negative for dizziness, weakness, numbness and headaches.  Psychiatric/Behavioral: Negative for confusion and sleep disturbance.    Objective:  BP 136/70 (BP Location: Left Arm, Patient Position: Sitting, Cuff Size: Large)   Pulse 91   Temp 98.2 F (36.8 C) (Oral)   Ht 5\' 2"  (1.575 m)   Wt 219 lb (99.3 kg)   SpO2 97%   BMI 40.06 kg/m   BP Readings from Last 3 Encounters:  01/04/18 136/70  10/05/17 138/68  07/07/17 (!) 164/80    Wt Readings from Last 3 Encounters:  01/04/18 219 lb (99.3 kg)  10/05/17 216 lb (98 kg)  07/07/17 218 lb (98.9 kg)    Physical Exam  Constitutional: She appears well-developed. No distress.  HENT:  Head: Normocephalic.  Right Ear: External ear normal.  Left Ear: External ear normal.  Nose: Nose normal.  Mouth/Throat: Oropharynx is clear and moist.  Eyes: Pupils are equal, round, and reactive to light. Conjunctivae are normal. Right eye exhibits no discharge. Left eye exhibits no discharge.  Neck: Normal range of motion. Neck supple. No JVD present. No tracheal deviation present. No thyromegaly present.  Cardiovascular: Normal  rate, regular rhythm and normal heart sounds.  Pulmonary/Chest: No stridor. No respiratory distress. She has no wheezes.  Abdominal: Soft. Bowel sounds are normal. She exhibits no distension and no mass. There is no tenderness. There is no rebound and no guarding.  Musculoskeletal: She exhibits tenderness. She exhibits no edema.  Lymphadenopathy:    She has no cervical adenopathy.  Neurological: She displays normal reflexes. No cranial nerve deficit. She exhibits normal muscle tone. Coordination abnormal.   Skin: No rash noted. No erythema.  Psychiatric: She has a normal mood and affect. Her behavior is normal. Judgment and thought content normal.  Obese Walker Hand tremor Trace to !+ ankle edema  Lab Results  Component Value Date   WBC 6.6 07/07/2017   HGB 10.5 (L) 07/07/2017   HCT 33.2 (L) 07/07/2017   PLT 208.0 07/07/2017   GLUCOSE 201 (H) 10/05/2017   CHOL 198 05/04/2011   TRIG 116.0 05/04/2011   HDL 62.30 05/04/2011   LDLDIRECT 126.1 03/31/2010   LDLCALC 113 (H) 05/04/2011   ALT 12 (L) 02/15/2017   AST 19 02/15/2017   NA 141 10/05/2017   K 4.3 10/05/2017   CL 104 10/05/2017   CREATININE 1.06 10/05/2017   BUN 28 (H) 10/05/2017   CO2 28 10/05/2017   TSH 3.63 07/07/2017   INR 2.2 (H) 10/25/2007   HGBA1C 6.4 10/05/2017   MICROALBUR 11.9 (H) 08/23/2016    Dg Lumbar Spine 2-3 Views  Result Date: 05/11/2017 CLINICAL DATA:  Pain following fall EXAM: LUMBAR SPINE - 2-3 VIEW COMPARISON:  CT abdomen and pelvis February 16, 2017 FINDINGS: Frontal, lateral, and spot lumbosacral lateral images were obtained. There are 5 non-rib-bearing lumbar type vertebral bodies. There is thoracolumbar levoscoliosis. There is no fracture. There is 6 mm of anterolisthesis of L4 on L5. There is no other spondylolisthesis. There is moderately severe disc space narrowing at L4-5. There is slight disc space narrowing at L3-4. There are anterior osteophytes at all levels. No erosive change. There is aortic atherosclerosis. There are multiple calcifications in the pelvis felt to represent uterine leiomyomas. There is a total hip replacement on the left. IMPRESSION: 1. 6 mm of anterolisthesis of L4 on L5, likely due to underlying spondylosis. No fracture evident. No other spondylolisthesis. 2.  Scoliosis, convex to the left. 3.  Multilevel osteoarthritic change, most notably at L4-5. 4.  Aortic atherosclerosis. 5. Multiple calcified uterine leiomyomas, also noted on recent prior CT. Aortic Atherosclerosis  (ICD10-I70.0). Electronically Signed   By: Lowella Grip III M.D.   On: 05/11/2017 15:30    Assessment & Plan:   There are no diagnoses linked to this encounter.   No orders of the defined types were placed in this encounter.    Follow-up: No follow-ups on file.  [name], MD

## 2018-03-21 ENCOUNTER — Other Ambulatory Visit: Payer: Self-pay | Admitting: Family

## 2018-04-06 ENCOUNTER — Ambulatory Visit (INDEPENDENT_AMBULATORY_CARE_PROVIDER_SITE_OTHER): Payer: Medicare HMO | Admitting: Internal Medicine

## 2018-04-06 ENCOUNTER — Encounter: Payer: Self-pay | Admitting: Internal Medicine

## 2018-04-06 ENCOUNTER — Other Ambulatory Visit (INDEPENDENT_AMBULATORY_CARE_PROVIDER_SITE_OTHER): Payer: Medicare HMO

## 2018-04-06 VITALS — BP 128/66 | HR 71 | Temp 98.9°F | Ht 62.0 in | Wt 205.0 lb

## 2018-04-06 DIAGNOSIS — E1122 Type 2 diabetes mellitus with diabetic chronic kidney disease: Secondary | ICD-10-CM | POA: Diagnosis not present

## 2018-04-06 DIAGNOSIS — I10 Essential (primary) hypertension: Secondary | ICD-10-CM

## 2018-04-06 DIAGNOSIS — E539 Vitamin B deficiency, unspecified: Secondary | ICD-10-CM

## 2018-04-06 DIAGNOSIS — E119 Type 2 diabetes mellitus without complications: Secondary | ICD-10-CM

## 2018-04-06 DIAGNOSIS — E559 Vitamin D deficiency, unspecified: Secondary | ICD-10-CM

## 2018-04-06 DIAGNOSIS — E538 Deficiency of other specified B group vitamins: Secondary | ICD-10-CM | POA: Diagnosis not present

## 2018-04-06 DIAGNOSIS — Z23 Encounter for immunization: Secondary | ICD-10-CM | POA: Diagnosis not present

## 2018-04-06 DIAGNOSIS — R269 Unspecified abnormalities of gait and mobility: Secondary | ICD-10-CM

## 2018-04-06 DIAGNOSIS — N183 Chronic kidney disease, stage 3 (moderate): Secondary | ICD-10-CM

## 2018-04-06 DIAGNOSIS — R251 Tremor, unspecified: Secondary | ICD-10-CM | POA: Diagnosis not present

## 2018-04-06 LAB — BASIC METABOLIC PANEL
BUN: 24 mg/dL — ABNORMAL HIGH (ref 6–23)
CALCIUM: 9.7 mg/dL (ref 8.4–10.5)
CHLORIDE: 108 meq/L (ref 96–112)
CO2: 26 mEq/L (ref 19–32)
CREATININE: 0.97 mg/dL (ref 0.40–1.20)
GFR: 69.75 mL/min (ref >60.00)
Glucose, Bld: 159 mg/dL — ABNORMAL HIGH (ref 70–99)
Potassium: 4.2 mEq/L (ref 3.5–5.1)
Sodium: 140 mEq/L (ref 135–145)

## 2018-04-06 LAB — HEMOGLOBIN A1C: Hgb A1c MFr Bld: 6.5 % (ref 4.6–6.5)

## 2018-04-06 MED ORDER — LOSARTAN POTASSIUM 50 MG PO TABS: ORAL_TABLET | ORAL | 3 refills | Status: DC

## 2018-04-06 NOTE — Addendum Note (Signed)
Addended by: Karren Cobble on: 04/06/2018 02:29 PM   Modules accepted: Orders

## 2018-04-06 NOTE — Progress Notes (Signed)
Subjective:  Patient ID: Holly Banks, female    DOB: Sep 17, 1928  Age: 84 y.o. MRN: [id number]  CC: No chief complaint on file.   HPI Holly Banks presents for DM, LBP - worse (PT helped this spring), HTN f/u C/o tremor - stopped Primidone  Outpatient Medications Prior to Visit  Medication Sig Dispense Refill  . aspirin 81 MG tablet Take 81 mg by mouth daily.    . Cholecalciferol (EQL VITAMIN D3) 1000 UNITS tablet Take 1,000 Units by mouth daily. For supplement    . hydrOXYzine (ATARAX/VISTARIL) 10 MG tablet Take 1 tablet (10 mg total) by mouth 3 (three) times daily as needed for itching. 180 tablet 1  . losartan (COZAAR) 50 MG tablet TAKE 1 TABLET EVERY DAY  FOR  HYPERTENSION 90 tablet 1  . meloxicam (MOBIC) 7.5 MG tablet TAKE 1 TABLET(7.5 MG) BY MOUTH DAILY 90 tablet 0  . metFORMIN (GLUCOPHAGE) 500 MG tablet TAKE 1 TABLET TWICE DAILY WITH MEALS FOR DIABETES 180 tablet 0  . Nutritional Supplements (ENSURE ORIGINAL) LIQD Take 1-3 Bottles by mouth daily. 90 Bottle 3  . nystatin (MYCOSTATIN/NYSTOP) powder Apply to area twice a day as needed 30 g 0  . potassium chloride (K-DUR,KLOR-CON) 10 MEQ tablet TAKE 1 TABLET EVERY DAY 90 tablet 1  . primidone (MYSOLINE) 50 MG tablet Take 1 tablet (50 mg total) by mouth 3 (three) times daily. 270 tablet 3  . repaglinide (PRANDIN) 1 MG tablet 1 tab ac qd-tid. Use Prandin (repaglinide) only if you eat 42 tablet 0  . verapamil (VERELAN PM) 360 MG 24 hr capsule TAKE 1 CAPSULE AT BEDTIME  FOR  HYPERTENSION 90 capsule 1  . vitamin B-12 (CYANOCOBALAMIN) 1000 MCG tablet Take 1,000 mcg by mouth daily.       No facility-administered medications prior to visit.     ROS: Review of Systems  Constitutional: Positive for fatigue. Negative for activity change, appetite change, chills and unexpected weight change.  HENT: Negative for congestion, mouth sores and sinus pressure.   Eyes: Negative for visual disturbance.  Respiratory: Negative for cough and  chest tightness.   Gastrointestinal: Negative for abdominal pain and nausea.  Genitourinary: Negative for difficulty urinating, frequency and vaginal pain.  Musculoskeletal: Positive for arthralgias, back pain and gait problem.  Skin: Negative for pallor and rash.  Neurological: Negative for dizziness, tremors, weakness, numbness and headaches.  Psychiatric/Behavioral: Negative for confusion, sleep disturbance and suicidal ideas.    Objective:  BP 128/66 (BP Location: Right Arm, Patient Position: Sitting, Cuff Size: Large)   Pulse 71   Temp 98.9 F (37.2 C) (Oral)   Ht 5\' 2"  (1.575 m)   Wt 205 lb (93 kg)   SpO2 97%   BMI 37.49 kg/m   BP Readings from Last 3 Encounters:  04/06/18 128/66  01/04/18 136/70  10/05/17 138/68    Wt Readings from Last 3 Encounters:  04/06/18 205 lb (93 kg)  01/04/18 219 lb (99.3 kg)  10/05/17 216 lb (98 kg)    Physical Exam  Constitutional: She appears well-developed. No distress.  HENT:  Head: Normocephalic.  Right Ear: External ear normal.  Left Ear: External ear normal.  Nose: Nose normal.  Mouth/Throat: Oropharynx is clear and moist.  Eyes: Pupils are equal, round, and reactive to light. Conjunctivae are normal. Right eye exhibits no discharge. Left eye exhibits no discharge.  Neck: Normal range of motion. Neck supple. No JVD present. No tracheal deviation present. No thyromegaly present.  Cardiovascular: Normal rate, regular  rhythm and normal heart sounds.  Pulmonary/Chest: No stridor. No respiratory distress. She has no wheezes.  Abdominal: Soft. Bowel sounds are normal. She exhibits no distension and no mass. There is no tenderness. There is no rebound and no guarding.  Musculoskeletal: She exhibits tenderness. She exhibits no edema.  Lymphadenopathy:    She has no cervical adenopathy.  Neurological: She displays abnormal reflex. No cranial nerve deficit. She exhibits normal muscle tone. Coordination abnormal.  Skin: No rash noted. No  erythema.  Psychiatric: She has a normal mood and affect. Her behavior is normal. Judgment and thought content normal.   in a w/c LS   Lab Results  Component Value Date   WBC 6.6 07/07/2017   HGB 10.5 (L) 07/07/2017   HCT 33.2 (L) 07/07/2017   PLT 208.0 07/07/2017   GLUCOSE 157 (H) 01/04/2018   CHOL 198 05/04/2011   TRIG 116.0 05/04/2011   HDL 62.30 05/04/2011   LDLDIRECT 126.1 03/31/2010   LDLCALC 113 (H) 05/04/2011   ALT 12 (L) 02/15/2017   AST 19 02/15/2017   NA 141 01/04/2018   K 3.9 01/04/2018   CL 103 01/04/2018   CREATININE 0.83 01/04/2018   BUN 16 01/04/2018   CO2 29 01/04/2018   TSH 3.63 07/07/2017   INR 2.2 (H) 10/25/2007   HGBA1C 5.7 01/04/2018   MICROALBUR 11.9 (H) 08/23/2016    Dg Lumbar Spine 2-3 Views  Result Date: 05/11/2017 CLINICAL DATA:  Pain following fall EXAM: LUMBAR SPINE - 2-3 VIEW COMPARISON:  CT abdomen and pelvis February 16, 2017 FINDINGS: Frontal, lateral, and spot lumbosacral lateral images were obtained. There are 5 non-rib-bearing lumbar type vertebral bodies. There is thoracolumbar levoscoliosis. There is no fracture. There is 6 mm of anterolisthesis of L4 on L5. There is no other spondylolisthesis. There is moderately severe disc space narrowing at L4-5. There is slight disc space narrowing at L3-4. There are anterior osteophytes at all levels. No erosive change. There is aortic atherosclerosis. There are multiple calcifications in the pelvis felt to represent uterine leiomyomas. There is a total hip replacement on the left. IMPRESSION: 1. 6 mm of anterolisthesis of L4 on L5, likely due to underlying spondylosis. No fracture evident. No other spondylolisthesis. 2.  Scoliosis, convex to the left. 3.  Multilevel osteoarthritic change, most notably at L4-5. 4.  Aortic atherosclerosis. 5. Multiple calcified uterine leiomyomas, also noted on recent prior CT. Aortic Atherosclerosis (ICD10-I70.0). Electronically Signed   By: Lowella Grip III M.D.    On: 05/11/2017 15:30    Assessment & Plan:   There are no diagnoses linked to this encounter.   No orders of the defined types were placed in this encounter.    Follow-up: No follow-ups on file.  [name], MD

## 2018-04-06 NOTE — Assessment & Plan Note (Signed)
Labs

## 2018-04-06 NOTE — Assessment & Plan Note (Signed)
Losartan, Verapamil

## 2018-04-06 NOTE — Assessment & Plan Note (Signed)
On B12 

## 2018-04-06 NOTE — Assessment & Plan Note (Signed)
Re-start PT 

## 2018-04-06 NOTE — Assessment & Plan Note (Signed)
On Vit D 

## 2018-04-06 NOTE — Assessment & Plan Note (Signed)
Re-start Primidone

## 2018-05-19 ENCOUNTER — Other Ambulatory Visit: Payer: Self-pay | Admitting: Internal Medicine

## 2018-05-25 ENCOUNTER — Ambulatory Visit (INDEPENDENT_AMBULATORY_CARE_PROVIDER_SITE_OTHER): Payer: Medicare HMO | Admitting: Internal Medicine

## 2018-05-25 ENCOUNTER — Encounter: Payer: Self-pay | Admitting: Internal Medicine

## 2018-05-25 VITALS — BP 136/80 | HR 80 | Temp 98.5°F | Ht 62.0 in | Wt 208.0 lb

## 2018-05-25 DIAGNOSIS — R2241 Localized swelling, mass and lump, right lower limb: Secondary | ICD-10-CM | POA: Diagnosis not present

## 2018-05-25 DIAGNOSIS — N183 Chronic kidney disease, stage 3 unspecified: Secondary | ICD-10-CM

## 2018-05-25 DIAGNOSIS — R238 Other skin changes: Secondary | ICD-10-CM

## 2018-05-25 DIAGNOSIS — E1122 Type 2 diabetes mellitus with diabetic chronic kidney disease: Secondary | ICD-10-CM | POA: Diagnosis not present

## 2018-05-25 MED ORDER — DICLOFENAC SODIUM 1 % TD GEL: 2.0000 g | Freq: Four times a day (QID) | TRANSDERMAL | 6 refills | Status: DC

## 2018-05-25 NOTE — Patient Instructions (Signed)
We have sent in a gel called diclofenac that you can rub on the foot up to 4 times per day for pain and we will get the ultrasound of the leg.

## 2018-05-25 NOTE — Progress Notes (Signed)
Subjective:    Patient ID: Holly Banks, female    DOB: 1928-10-14, 84 y.o.   MRN: [id number]  HPI The patient is an 84 YO female coming in for right foot pain and swelling the last 2 weeks or so. They also feel that the skin on both feet is turning dark in the last several months to years (timeline unclear). She and her family member provide the history today. She does sit in a lift chair about 90% of the time and is not very active. She is having swelling in both legs but the last 2 weeks the right foot was worse than the left. Denies that the leg was swollen much above the ankle. Denies trauma or injury to the area. They deny dropping anything on it. She has been trying many creams and lotions including CBD oil on it which seem to have helped. The swelling is actually much improved today. She does not have compression hose. Denies recent long travel. The activity level has been stable for some time now.   Review of Systems  Constitutional: Negative.   HENT: Negative.   Eyes: Negative.   Respiratory: Negative for cough, chest tightness and shortness of breath.   Cardiovascular: Positive for leg swelling. Negative for chest pain and palpitations.  Gastrointestinal: Negative for abdominal distention, abdominal pain, constipation, diarrhea, nausea and vomiting.  Musculoskeletal: Positive for arthralgias and joint swelling. Negative for myalgias.  Skin: Positive for color change.  Neurological: Negative.   Psychiatric/Behavioral: Negative.       Objective:   Physical Exam Constitutional:      Appearance: She is well-developed.     Comments: Overweight  HENT:     Head: Normocephalic and atraumatic.  Neck:     Musculoskeletal: Normal range of motion.  Cardiovascular:     Rate and Rhythm: Normal rate and regular rhythm.  Pulmonary:     Effort: Pulmonary effort is normal. No respiratory distress.     Breath sounds: Normal breath sounds. No wheezing or rales.  Abdominal:   General: Bowel sounds are normal. There is no distension.     Palpations: Abdomen is soft.     Tenderness: There is no abdominal tenderness. There is no rebound.  Musculoskeletal:        General: Swelling present.     Right lower leg: Edema present.     Left lower leg: Edema present.       Feet:     Comments: There is 1+ edema to both legs to the knees, right and left ankles appear equal or similar in size, right ankle is tender to touch over the vein cord and on the surface of the foot.   Skin:    General: Skin is warm and dry.     Comments: There is thick, darker than skin tone skin on the feet bilaterally to mid shin. There is an area on the surface of the right foot with a possible vein cord which is tender to touch about 1 cm long, does not feel like an abscess and there is no cellulitis  Neurological:     Mental Status: She is alert and oriented to person, place, and time.     Coordination: Coordination abnormal.    Vitals:   05/25/18 1454  BP: 136/80  Pulse: 80  Temp: 98.5 F (36.9 C)  TempSrc: Oral  SpO2: 98%  Weight: 208 lb (94.3 kg)  Height: 5\' 2"  (1.575 m)      Assessment & Plan:

## 2018-05-26 ENCOUNTER — Ambulatory Visit (HOSPITAL_COMMUNITY)
Admission: RE | Admit: 2018-05-26 | Discharge: 2018-05-26 | Disposition: A | Discharge status: Home / Self Care | Payer: Medicare HMO | Source: Ambulatory Visit | Attending: Internal Medicine | Admitting: Internal Medicine

## 2018-05-26 DIAGNOSIS — R2241 Localized swelling, mass and lump, right lower limb: Secondary | ICD-10-CM | POA: Insufficient documentation

## 2018-05-26 DIAGNOSIS — R238 Other skin changes: Secondary | ICD-10-CM | POA: Insufficient documentation

## 2018-05-26 NOTE — Assessment & Plan Note (Signed)
Foot exam done .   

## 2018-05-26 NOTE — Assessment & Plan Note (Signed)
Suspect that the darkening of the skin bilaterally on feet and low leg is due to chronic venous stasis and given reassurance to patient and family that she does not have a skin infection today.

## 2018-05-26 NOTE — Assessment & Plan Note (Addendum)
Suspect superficial phlebitis given exam. Does not feel like abscess and no appearance of cellulitis. Getting US venous of the right LE. Talked to them about compression stockings. Rx for voltaren gel to use for pain.

## 2018-05-29 ENCOUNTER — Other Ambulatory Visit: Payer: Self-pay | Admitting: Family

## 2018-05-30 MED ORDER — METFORMIN HCL 500 MG PO TABS: 500.0000 mg | ORAL_TABLET | Freq: Two times a day (BID) | ORAL | 0 refills | Status: DC

## 2018-07-12 ENCOUNTER — Encounter: Payer: Self-pay | Admitting: Internal Medicine

## 2018-07-12 ENCOUNTER — Ambulatory Visit (INDEPENDENT_AMBULATORY_CARE_PROVIDER_SITE_OTHER): Payer: Medicare HMO | Admitting: Internal Medicine

## 2018-07-12 ENCOUNTER — Other Ambulatory Visit (INDEPENDENT_AMBULATORY_CARE_PROVIDER_SITE_OTHER): Payer: Medicare HMO

## 2018-07-12 DIAGNOSIS — M1990 Unspecified osteoarthritis, unspecified site: Secondary | ICD-10-CM

## 2018-07-12 DIAGNOSIS — E538 Deficiency of other specified B group vitamins: Secondary | ICD-10-CM

## 2018-07-12 DIAGNOSIS — I10 Essential (primary) hypertension: Secondary | ICD-10-CM

## 2018-07-12 DIAGNOSIS — N183 Chronic kidney disease, stage 3 unspecified: Secondary | ICD-10-CM

## 2018-07-12 DIAGNOSIS — E1122 Type 2 diabetes mellitus with diabetic chronic kidney disease: Secondary | ICD-10-CM

## 2018-07-12 DIAGNOSIS — E559 Vitamin D deficiency, unspecified: Secondary | ICD-10-CM

## 2018-07-12 LAB — BASIC METABOLIC PANEL
BUN: 19 mg/dL (ref 6–23)
CHLORIDE: 104 meq/L (ref 96–112)
CO2: 26 mEq/L (ref 19–32)
Calcium: 9.5 mg/dL (ref 8.4–10.5)
Creatinine, Ser: 0.92 mg/dL (ref 0.40–1.20)
GFR: 69.72 mL/min (ref >60.00)
Glucose, Bld: 170 mg/dL — ABNORMAL HIGH (ref 70–99)
Potassium: 3.9 mEq/L (ref 3.5–5.1)
Sodium: 139 mEq/L (ref 135–145)

## 2018-07-12 LAB — CBC WITH DIFFERENTIAL/PLATELET
Basophils Absolute: 0 10*3/uL (ref 0.0–0.1)
Basophils Relative: 0.8 % (ref 0.0–3.0)
Eosinophils Absolute: 0.1 10*3/uL (ref 0.0–0.7)
Eosinophils Relative: 2.2 % (ref 0.0–5.0)
HEMATOCRIT: 30.3 % — AB (ref 36.0–46.0)
Hemoglobin: 9.6 g/dL — ABNORMAL LOW (ref 12.0–15.0)
Lymphocytes Relative: 33.5 % (ref 12.0–46.0)
Lymphs Abs: 1.6 10*3/uL (ref 0.7–4.0)
MCHC: 31.6 g/dL (ref 30.0–36.0)
MCV: 83 fl (ref 78.0–100.0)
Monocytes Absolute: 0.4 10*3/uL (ref 0.1–1.0)
Monocytes Relative: 8.2 % (ref 3.0–12.0)
Neutro Abs: 2.6 10*3/uL (ref 1.4–7.7)
Neutrophils Relative %: 55.3 % (ref 43.0–77.0)
Platelets: 219 10*3/uL (ref 150.0–400.0)
RBC: 3.65 Mil/uL — ABNORMAL LOW (ref 3.87–5.11)
RDW: 14.4 % (ref 11.5–15.5)
WBC: 4.7 10*3/uL (ref 4.0–10.5)

## 2018-07-12 LAB — HEPATIC FUNCTION PANEL
ALT: 6 U/L (ref 0–35)
AST: 14 U/L (ref 0–37)
Albumin: 3.7 g/dL (ref 3.5–5.2)
Alkaline Phosphatase: 63 U/L (ref 39–117)
Bilirubin, Direct: 0.1 mg/dL (ref 0.0–0.3)
Total Bilirubin: 0.5 mg/dL (ref 0.2–1.2)
Total Protein: 7 g/dL (ref 6.0–8.3)

## 2018-07-12 LAB — SEDIMENTATION RATE: Sed Rate: 55 mm/hr — ABNORMAL HIGH (ref 0–30)

## 2018-07-12 LAB — URIC ACID: Uric Acid, Serum: 2.6 mg/dL (ref 2.4–7.0)

## 2018-07-12 LAB — HEMOGLOBIN A1C: Hgb A1c MFr Bld: 6.4 % (ref 4.6–6.5)

## 2018-07-12 MED ORDER — LOSARTAN POTASSIUM 50 MG PO TABS: ORAL_TABLET | ORAL | 3 refills | Status: DC

## 2018-07-12 MED ORDER — VERAPAMIL HCL ER 360 MG PO CP24: ORAL_CAPSULE | ORAL | 3 refills | Status: DC

## 2018-07-12 MED ORDER — METHYLPREDNISOLONE 4 MG PO TBPK: ORAL_TABLET | ORAL | 0 refills | Status: DC

## 2018-07-12 MED ORDER — REPAGLINIDE 1 MG PO TABS: ORAL_TABLET | ORAL | 3 refills | Status: DC

## 2018-07-12 MED ORDER — METFORMIN HCL 500 MG PO TABS: 500.0000 mg | ORAL_TABLET | Freq: Two times a day (BID) | ORAL | 3 refills | Status: DC

## 2018-07-12 NOTE — Assessment & Plan Note (Signed)
Metformin 

## 2018-07-12 NOTE — Assessment & Plan Note (Signed)
RA vs gout vs other Labs Medrol pack

## 2018-07-12 NOTE — Assessment & Plan Note (Signed)
On B12 

## 2018-07-12 NOTE — Assessment & Plan Note (Signed)
Vit d 

## 2018-07-12 NOTE — Assessment & Plan Note (Signed)
Losartan, Verapamil

## 2018-07-12 NOTE — Progress Notes (Signed)
Subjective:  Patient ID: Holly Banks, female    DOB: [birth year]  Age: 85 y.o. MRN: [id number]  CC: No chief complaint on file.   HPI Holly Banks presents for DM, HTN, OA C/o R 4th finger pain x 2 d C/o B shoulder pain - chronic, worse  Outpatient Medications Prior to Visit  Medication Sig Dispense Refill  . aspirin 81 MG tablet Take 81 mg by mouth daily.    . Cholecalciferol (EQL VITAMIN D3) 1000 UNITS tablet Take 1,000 Units by mouth daily. For supplement    . diclofenac sodium (VOLTAREN) 1 % GEL Apply 2 g topically 4 (four) times daily. 100 g 6  . hydrOXYzine (ATARAX/VISTARIL) 10 MG tablet Take 1 tablet (10 mg total) by mouth 3 (three) times daily as needed for itching. 180 tablet 1  . losartan (COZAAR) 50 MG tablet TAKE 1 TABLET EVERY DAY  FOR  HYPERTENSION 90 tablet 3  . meloxicam (MOBIC) 7.5 MG tablet TAKE 1 TABLET(7.5 MG) BY MOUTH DAILY 90 tablet 0  . metFORMIN (GLUCOPHAGE) 500 MG tablet Take 1 tablet (500 mg total) by mouth 2 (two) times daily with a meal. Must keep scheduled appt for future refills 180 tablet 0  . Nutritional Supplements (ENSURE ORIGINAL) LIQD Take 1-3 Bottles by mouth daily. 90 Bottle 3  . nystatin (MYCOSTATIN/NYSTOP) powder Apply to area twice a day as needed 30 g 0  . potassium chloride (K-DUR,KLOR-CON) 10 MEQ tablet TAKE 1 TABLET EVERY DAY 90 tablet 1  . primidone (MYSOLINE) 50 MG tablet Take 1 tablet (50 mg total) by mouth 3 (three) times daily. 270 tablet 3  . repaglinide (PRANDIN) 1 MG tablet 1 tab ac qd-tid. Use Prandin (repaglinide) only if you eat 42 tablet 0  . verapamil (VERELAN PM) 360 MG 24 hr capsule TAKE 1 CAPSULE AT BEDTIME  FOR  HYPERTENSION 90 capsule 1  . vitamin B-12 (CYANOCOBALAMIN) 1000 MCG tablet Take 1,000 mcg by mouth daily.       No facility-administered medications prior to visit.     ROS: Review of Systems  Constitutional: Positive for fatigue. Negative for activity change, appetite change, chills and unexpected weight  change.  HENT: Negative for congestion, mouth sores and sinus pressure.   Eyes: Negative for visual disturbance.  Respiratory: Negative for cough and chest tightness.   Gastrointestinal: Negative for abdominal pain and nausea.  Genitourinary: Negative for difficulty urinating, frequency and vaginal pain.  Musculoskeletal: Positive for arthralgias, gait problem and joint swelling. Negative for back pain.  Skin: Positive for color change. Negative for pallor and rash.  Neurological: Negative for dizziness, tremors, weakness, numbness and headaches.  Psychiatric/Behavioral: Negative for confusion, sleep disturbance and suicidal ideas.    Objective:  BP 132/82 (BP Location: Left Arm, Patient Position: Sitting, Cuff Size: Large)   Pulse 76   Temp 98 F (36.7 C) (Oral)   Ht 5\' 2"  (1.575 m)   Wt 202 lb (91.6 kg)   SpO2 98%   BMI 36.95 kg/m   BP Readings from Last 3 Encounters:  07/12/18 132/82  05/25/18 136/80  04/06/18 128/66    Wt Readings from Last 3 Encounters:  07/12/18 202 lb (91.6 kg)  05/25/18 208 lb (94.3 kg)  04/06/18 205 lb (93 kg)    Physical Exam Constitutional:      General: She is not in acute distress.    Appearance: She is well-developed.  HENT:     Head: Normocephalic.     Right Ear: External ear normal.  Left Ear: External ear normal.     Nose: Nose normal.  Eyes:     General:        Right eye: No discharge.        Left eye: No discharge.     Conjunctiva/sclera: Conjunctivae normal.     Pupils: Pupils are equal, round, and reactive to light.  Neck:     Musculoskeletal: Normal range of motion and neck supple.     Thyroid: No thyromegaly.     Vascular: No JVD.     Trachea: No tracheal deviation.  Cardiovascular:     Rate and Rhythm: Normal rate and regular rhythm.     Heart sounds: Normal heart sounds.  Pulmonary:     Effort: No respiratory distress.     Breath sounds: No stridor. No wheezing.  Abdominal:     General: Bowel sounds are normal.  There is no distension.     Palpations: Abdomen is soft. There is no mass.     Tenderness: There is no abdominal tenderness. There is no guarding or rebound.  Musculoskeletal:        General: Tenderness present.  Lymphadenopathy:     Cervical: No cervical adenopathy.  Skin:    Findings: Erythema present. No rash.  Neurological:     Cranial Nerves: No cranial nerve deficit.     Motor: No abnormal muscle tone.     Coordination: Coordination abnormal.     Gait: Gait abnormal.     Deep Tendon Reflexes: Reflexes normal.  Psychiatric:        Behavior: Behavior normal.        Thought Content: Thought content normal.        Judgment: Judgment normal.    In a w/c R 4th finger red and swollen B shoulders w/decr and painful ROM Obese    Lab Results  Component Value Date   WBC 6.6 07/07/2017   HGB 10.5 (L) 07/07/2017   HCT 33.2 (L) 07/07/2017   PLT 208.0 07/07/2017   GLUCOSE 159 (H) 04/06/2018   CHOL 198 05/04/2011   TRIG 116.0 05/04/2011   HDL 62.30 05/04/2011   LDLDIRECT 126.1 03/31/2010   LDLCALC 113 (H) 05/04/2011   ALT 12 (L) 02/15/2017   AST 19 02/15/2017   NA 140 04/06/2018   K 4.2 04/06/2018   CL 108 04/06/2018   CREATININE 0.97 04/06/2018   BUN 24 (H) 04/06/2018   CO2 26 04/06/2018   TSH 3.63 07/07/2017   INR 2.2 (H) 10/25/2007   HGBA1C 6.5 04/06/2018   MICROALBUR 11.9 (H) 08/23/2016    Vas [place] Lower Extremity Venous (dvt)  Result Date: 05/27/2018  Lower Venous Study Indications: Swelling.  Risk Factors: Obesity. Anticoagulation: Aspirin. Limitations: Body habitus. Performing Technologist: Guinevere Ferrari RVT  Examination Guidelines: A complete evaluation includes B-mode imaging, spectral Doppler, color Doppler, and power Doppler as needed of all accessible portions of each vessel. Bilateral testing is considered an integral part of a complete examination. Limited examinations for reoccurring indications may be performed as noted.  Right Venous Findings:  +---------+---------------+---------+-----------+----------+--------------+          CompressibilityPhasicitySpontaneityPropertiesSummary        +---------+---------------+---------+-----------+----------+--------------+ CFV      Full           Yes      Yes                                 +---------+---------------+---------+-----------+----------+--------------+  SFJ      Full           Yes      Yes                                 +---------+---------------+---------+-----------+----------+--------------+ FV Prox  Full           Yes      Yes                                 +---------+---------------+---------+-----------+----------+--------------+ FV Mid   Full                                                        +---------+---------------+---------+-----------+----------+--------------+ FV DistalFull           Yes      Yes                                 +---------+---------------+---------+-----------+----------+--------------+ PFV      Full                                                        +---------+---------------+---------+-----------+----------+--------------+ POP      Full           Yes      Yes                                 +---------+---------------+---------+-----------+----------+--------------+ PTV      Full           Yes      Yes                                 +---------+---------------+---------+-----------+----------+--------------+ PERO                                                  Not visualized +---------+---------------+---------+-----------+----------+--------------+ Gastroc  Full                                                        +---------+---------------+---------+-----------+----------+--------------+ GSV      Full           Yes      Yes                                 +---------+---------------+---------+-----------+----------+--------------+  Right Technical Findings: Not visualized  segments include calf veins.  Left Venous Findings: +---+---------------+---------+-----------+----------+-------+    CompressibilityPhasicitySpontaneityPropertiesSummary +---+---------------+---------+-----------+----------+-------+ CFVFull  Yes      Yes                          +---+---------------+---------+-----------+----------+-------+   Findings reported to Dr. Sharlet Salina at 11:36.  Summary: Right: No evidence of deep vein thrombosis in the lower extremity. No indirect evidence of obstruction proximal to the inguinal ligament. No cystic structure found in the popliteal fossa. Left: No evidence of common femoral vein obstruction.  *See table(s) above for measurements and observations. Electronically signed by Jenkins Rouge MD on 05/27/2018 at 3:27:20 PM.    Final     Assessment & Plan:   There are no diagnoses linked to this encounter.   No orders of the defined types were placed in this encounter.    Follow-up: No follow-ups on file.  [name], MD

## 2018-07-13 LAB — RHEUMATOID FACTOR: Rheumatoid fact SerPl-aCnc: <14 IU/mL (ref <14)

## 2018-07-17 ENCOUNTER — Other Ambulatory Visit: Payer: Self-pay | Admitting: Internal Medicine

## 2018-07-17 NOTE — Telephone Encounter (Signed)
Copied from [place] [id number]. Topic: General - Other >> Jul 17, 2018  1:07 PM Lennox Solders wrote: Reason for CRM: pt daughter Holly Banks is calling and her mother needs refill on hydroxyzine 10 mg #270 for 90 day supply sent to [facility] [place] [facility]. Pt has only 2 pills left

## 2018-07-17 NOTE — Telephone Encounter (Signed)
Requested medication (s) are due for refill today: Yes  Requested medication (s) are on the active medication list: Yes  Last refill:  07/07/17  Future visit scheduled: Yes  Notes to clinic:  Unable to refill, expired Rx     Requested Prescriptions  Pending Prescriptions Disp Refills   hydrOXYzine (ATARAX/VISTARIL) 10 MG tablet 180 tablet 1    Sig: Take 1 tablet (10 mg total) by mouth 3 (three) times daily as needed for itching.     Ear, Nose, and Throat:  Antihistamines Passed - 07/17/2018  2:45 PM      Passed - Valid encounter within last 12 months    Recent Outpatient Visits          5 days ago Essential hypertension   [place], Evie Lacks, MD   1 month ago Localized swelling of right foot   [place], Elizabeth A, MD   3 months ago Type 2 diabetes mellitus with stage 3 chronic kidney disease, without long-term current use of insulin ([place])   [facility], Evie Lacks, MD   6 months ago Essential hypertension   [place] Plotnikov, Evie Lacks, MD   9 months ago Type 2 diabetes mellitus with stage 3 chronic kidney disease, without long-term current use of insulin ([place])   [facility], Evie Lacks, MD      Future Appointments            In 2 months Plotnikov, Evie Lacks, MD [place], [place]

## 2018-07-18 MED ORDER — HYDROXYZINE HCL 10 MG PO TABS: 10.0000 mg | ORAL_TABLET | Freq: Three times a day (TID) | ORAL | 1 refills | Status: DC | PRN

## 2018-10-11 ENCOUNTER — Encounter: Payer: Self-pay | Admitting: Internal Medicine

## 2018-10-11 ENCOUNTER — Ambulatory Visit (INDEPENDENT_AMBULATORY_CARE_PROVIDER_SITE_OTHER): Payer: Medicare HMO | Admitting: Internal Medicine

## 2018-10-11 DIAGNOSIS — R269 Unspecified abnormalities of gait and mobility: Secondary | ICD-10-CM

## 2018-10-11 DIAGNOSIS — M79675 Pain in left toe(s): Secondary | ICD-10-CM | POA: Diagnosis not present

## 2018-10-11 DIAGNOSIS — R251 Tremor, unspecified: Secondary | ICD-10-CM

## 2018-10-11 DIAGNOSIS — M79674 Pain in right toe(s): Secondary | ICD-10-CM | POA: Diagnosis not present

## 2018-10-11 DIAGNOSIS — I10 Essential (primary) hypertension: Secondary | ICD-10-CM

## 2018-10-11 MED ORDER — PRIMIDONE 50 MG PO TABS: 100.0000 mg | ORAL_TABLET | Freq: Three times a day (TID) | ORAL | 3 refills | Status: DC

## 2018-10-11 MED ORDER — NIFEDIPINE ER 60 MG PO TB24: 60.0000 mg | ORAL_TABLET | Freq: Every day | ORAL | 3 refills | Status: DC

## 2018-10-11 NOTE — Assessment & Plan Note (Signed)
Verapamil is too expensive however will change prescription to Cardizem generic

## 2018-10-11 NOTE — Assessment & Plan Note (Signed)
PT at home when COVID restrictions are lifted

## 2018-10-11 NOTE — Assessment & Plan Note (Signed)
Chronic, worse lately Podiatry referral

## 2018-10-11 NOTE — Assessment & Plan Note (Addendum)
Primidone did not help.  It sounds like her tremor is related to deconditioning.  Physical therapy did not work out much due to poor cooperation and effort.  I suggested that Holly Banks gets up and walks with a walker 2-3 times as much as she does normal.  It should help with tremor.  Will stop primidone.  We can try PT at home when COVID restrictions are lifted

## 2018-10-11 NOTE — Progress Notes (Signed)
Virtual Visit via Video Note  I connected with Holly Banks on 10/11/18 at  2:00 PM EDT by a video enabled telemedicine application and verified that I am speaking with the correct person using two identifiers.   I discussed the limitations of evaluation and management by telemedicine and the availability of in person appointments. The patient expressed understanding and agreed to proceed.  History of Present Illness: We need to follow-up on diabetes, hypertension, tremor in hands and arms when she tries to get up and walk with a walker.  Primidone did not help with tremor.  She complains of pain in her toes all the time, worse with walking  There has been no runny nose, cough, chest pain, shortness of breath, abdominal pain, diarrhea, constipation,skin rashes.   Observations/Objective: The patient appears to be in no acute distress, looks well. No tremor at rest.  No head tremor.  No parkinsonian traits Assessment and Plan: COVID-19 related issues discussed See my Assessment and Plan. Follow Up Instructions:    I discussed the assessment and treatment plan with the patient. The patient was provided an opportunity to ask questions and all were answered. The patient agreed with the plan and demonstrated an understanding of the instructions.   The patient was advised to call back or seek an in-person evaluation if the symptoms worsen or if the condition fails to improve as anticipated.  I provided face-to-face time during this encounter. We were at different locations.   [name], MD

## 2018-10-17 ENCOUNTER — Ambulatory Visit (INDEPENDENT_AMBULATORY_CARE_PROVIDER_SITE_OTHER): Payer: Medicare HMO | Admitting: Family

## 2018-10-17 ENCOUNTER — Encounter: Payer: Self-pay | Admitting: Family

## 2018-10-17 DIAGNOSIS — H1132 Conjunctival hemorrhage, left eye: Secondary | ICD-10-CM | POA: Diagnosis not present

## 2018-10-17 DIAGNOSIS — H0102B Squamous blepharitis left eye, upper and lower eyelids: Secondary | ICD-10-CM | POA: Diagnosis not present

## 2018-10-17 DIAGNOSIS — H209 Unspecified iridocyclitis: Secondary | ICD-10-CM

## 2018-10-17 DIAGNOSIS — Z961 Presence of intraocular lens: Secondary | ICD-10-CM | POA: Diagnosis not present

## 2018-10-17 DIAGNOSIS — H0102A Squamous blepharitis right eye, upper and lower eyelids: Secondary | ICD-10-CM | POA: Diagnosis not present

## 2018-10-17 LAB — HM DIABETES EYE EXAM

## 2018-10-17 NOTE — Progress Notes (Signed)
Holly Banks is a 85 y.o. female with the following history as recorded in EpicCare:  Patient Active Problem List   Diagnosis Date Noted  . Toe pain, bilateral 10/11/2018  . Arthritis 07/12/2018  . Localized swelling of right foot 05/26/2018  . Change of skin color 05/26/2018  . Gait disorder 10/05/2017  . Decubitus skin ulcer 10/05/2017  . Tremor 07/07/2017  . Weakness 04/05/2017  . Chills (without fever) 08/23/2016  . Neck pain 05/24/2016  . Ganglion cyst of flexor tendon sheath of finger 08/22/2015  . Paresthesia 11/16/2013  . Acute blood loss anemia 07/19/2013  . Fracture of femur, right, closed ([place]) 07/11/2013  . URI (upper respiratory infection) 06/15/2013  . Cough 03/15/2013  . Vision loss, right eye 01/05/2012  . UTI (lower urinary tract infection) 03/31/2011  . Rash 11/05/2010  . SHOULDER PAIN 04/17/2009  . TOBACCO USE, QUIT 04/17/2009  . GASTRITIS 12/25/2008  . ABNORMAL EXAM-BILIARY TRACT 10/16/2008  . GASTROINTESTINAL XRAY, ABNORMAL 10/16/2008  . ANAL PRURITUS 09/17/2008  . Edema 09/17/2008  . OTHER DYSPHAGIA 09/17/2008  . HAND PAIN 09/07/2007  . Vitamin B 12 deficiency 07/05/2007  . PARESTHESIA 07/05/2007  . TINNITUS NOS 03/23/2007  . URINARY INCONTINENCE 03/21/2007  . DM type 2 causing CKD stage 3 ([place]) 01/16/2007  . Vitamin D deficiency 01/16/2007  . Gout 01/16/2007  . HYPERKALEMIA 01/16/2007  . OBESITY, MORBID 01/16/2007  . Essential hypertension 01/16/2007  . GERD 01/16/2007  . Osteoarthritis 01/16/2007  . SYMPTOM, INCONTINENCE, URINARY NEC 01/16/2007  . COLONIC POLYPS, HX OF 01/16/2007    Current Outpatient Medications  Medication Sig Dispense Refill  . aspirin 81 MG tablet Take 81 mg by mouth daily.    . Cholecalciferol (EQL VITAMIN D3) 1000 UNITS tablet Take 1,000 Units by mouth daily. For supplement    . diclofenac sodium (VOLTAREN) 1 % GEL Apply 2 g topically 4 (four) times daily. 100 g 6  . hydrOXYzine (ATARAX/VISTARIL) 10 MG tablet Take  1 tablet (10 mg total) by mouth 3 (three) times daily as needed for itching. 180 tablet 1  . losartan (COZAAR) 50 MG tablet TAKE 1 TABLET EVERY DAY  FOR  HYPERTENSION 90 tablet 3  . metFORMIN (GLUCOPHAGE) 500 MG tablet Take 1 tablet (500 mg total) by mouth 2 (two) times daily with a meal. Must keep scheduled appt for future refills 180 tablet 3  . methylPREDNISolone (MEDROL DOSEPAK) 4 MG TBPK tablet As directed 21 tablet 0  . NIFEdipine (ADALAT CC) 60 MG 24 hr tablet Take 1 tablet (60 mg total) by mouth daily. 90 tablet 3  . Nutritional Supplements (ENSURE ORIGINAL) LIQD Take 1-3 Bottles by mouth daily. 90 Bottle 3  . nystatin (MYCOSTATIN/NYSTOP) powder Apply to area twice a day as needed 30 g 0  . potassium chloride (K-DUR,KLOR-CON) 10 MEQ tablet TAKE 1 TABLET EVERY DAY 90 tablet 1  . primidone (MYSOLINE) 50 MG tablet Take 2 tablets (100 mg total) by mouth 3 (three) times daily. 270 tablet 3  . repaglinide (PRANDIN) 1 MG tablet 1 tab ac qd-tid. Use Prandin (repaglinide) only if you eat 270 tablet 3  . verapamil (VERELAN PM) 360 MG 24 hr capsule TAKE 1 CAPSULE AT BEDTIME  FOR  HYPERTENSION 90 capsule 3  . vitamin B-12 (CYANOCOBALAMIN) 1000 MCG tablet Take 1,000 mcg by mouth daily.       No current facility-administered medications for this visit.     Allergies: Ditropan [oxybutynin] and Penicillins  Past Medical History:  Diagnosis Date  .  Diabetes mellitus    type II  . Diverticulosis   . Gastritis    w/ hx of h. pylori  . GERD (gastroesophageal reflux disease)   . Gout   . Hyperkalemia   . Hypertension   . Leiomyoma 2-02   colonic polypoid   . Morbid obesity ([place])   . Osteoarthritis   . Pain in joint, lower leg   . Unspecified vitamin D deficiency   . Urinary incontinence     Past Surgical History:  Procedure Laterality Date  . CHOLECYSTECTOMY    . LITHOTRIPSY    . ORIF FEMUR FRACTURE N/A 07/12/2013   Procedure: OPEN REDUCTION INTERNAL FIXATION (ORIF) DISTAL FEMUR FRACTURE;   Surgeon: Hessie Dibble, MD;  Location: [facility];  Service: Orthopedics;  Laterality: N/A;  . right tkr    . thr left    . thr left 3/4    . TOTAL HIP ARTHROPLASTY  2009   right  . TOTAL HIP ARTHROPLASTY     left    Family History  Problem Relation Age of Onset  . Cancer Sister        breast, stomach  . Diabetes Sister   . Diabetes Other   . Colon cancer Neg Hx   . Stomach cancer Neg Hx   . Esophageal cancer Neg Hx     Social History   Tobacco Use  . Smoking status: Former Smoker    Last attempt to quit: 06/07/1960    Years since quitting: 58.4  . Smokeless tobacco: Never Used  Substance Use Topics  . Alcohol use: No    Subjective:    I connected with Vilinda Flake on 10/17/18 at 11:00 AM EDT by a video enabled telemedicine application and verified that I am speaking with the correct person using two identifiers. Daughter and patient and I are the only people on the video call.    I discussed the limitations of evaluation and management by telemedicine and the availability of in person appointments. The patient expressed understanding and agreed to proceed.   Per daughter, her mother's left eye suddenly became red on Saturday; no drainage noted; per patient, eye does feel slightly irritated but denies any vision changes; "feels like something is in the eye." no history of glaucoma; right eye is not affected; denies any light sensitivity or injury to the eye; per daughter, have not checked blood pressure recently but typically well controlled; denies any cough/ sneezing/ straining that could have caused symptoms.    Objective:  There were no vitals filed for this visit.  General: Well developed, well nourished, in no acute distress  Head: Normocephalic and atraumatic  Eyes: Very poor video quality- Sclera and conjunctiva look dark/ cloudy; entire eye appears to be involved- not localized inner eye only; Lungs: Respirations unlabored;  Neurologic: Alert and oriented;  speech intact; face symmetrical;   Assessment:  1. Iritis   2. Uveitis     Plan:  This could be subconjunctival hemorrhage but am unable to clearly visualize the eye; as patient feels like the eye is irritated and because the entire eye appears to be involved, need to make sure it is not iritis or uveitis; refer to ophthalmology for further evaluation; patient and daughter are comfortable with this plan.   No follow-ups on file.  Orders Placed This Encounter  Procedures  . Ambulatory referral to Ophthalmology    Referral Priority:   Emergency    Referral Type:   Consultation    Referral  Reason:   Specialty Services Required    Requested Specialty:   Ophthalmology    Number of Visits Requested:   1    Requested Prescriptions    No prescriptions requested or ordered in this encounter

## 2018-11-01 ENCOUNTER — Other Ambulatory Visit: Payer: Self-pay | Admitting: Internal Medicine

## 2018-11-13 ENCOUNTER — Telehealth: Payer: Self-pay | Admitting: Internal Medicine

## 2018-11-13 NOTE — Telephone Encounter (Signed)
Okay.  Please fill out.  Thank you

## 2018-11-13 NOTE — Telephone Encounter (Signed)
Copied from [place] [id number]. Topic: Quick Communication - See Telephone Encounter >> Nov 13, 2018 11:51 AM Blase Mess A wrote: CRM for notification. See Telephone encounter for: 11/13/18.  Patient's daughter is calling because her sister is a LPN is coming to take care of her mother. Requesting to drop off FMLA paperwork for her sister. Please advise CB-Holly Banks- [phone]

## 2018-11-13 NOTE — Telephone Encounter (Signed)
Spoke with patient daughter Magda Paganini &She is requesting 10 weeks of leave to be with her mom (the patient) starting on 11/15/18. She lives out of town & will coming to [facility] to be with her.   Please advise if you approve, Thank you.   [phone] Magda Paganini

## 2018-11-14 NOTE — Telephone Encounter (Signed)
Forms have been completed, placed in providers box to review and sign.

## 2018-11-14 NOTE — Telephone Encounter (Signed)
Pt daughter Magda Paganini is calling about the paperwork and states they need the paperwork today/ please advise

## 2018-11-14 NOTE — Telephone Encounter (Signed)
Holly Banks is calling back. Pt daughter states that this is urgent. She would like a call back as soon as possible. Please advise

## 2018-11-14 NOTE — Telephone Encounter (Signed)
Can office please call leslie if paper work is signed and faxed or emailed today/ she has to make arrangements to get to [facility] and can not do so until she has that paperwork back/ please advise

## 2018-11-15 DIAGNOSIS — Z0279 Encounter for issue of other medical certificate: Secondary | ICD-10-CM

## 2018-11-15 NOTE — Telephone Encounter (Signed)
Spoke with Magda Paganini, She wanted to know if the paperwork has been completed.   Informed her when I came in this morning, forms have been signed. Faxed to [facility] [phone] , Copy sent to scan, &Charged for.

## 2018-11-22 DIAGNOSIS — H348112 Central retinal vein occlusion, right eye, stable: Secondary | ICD-10-CM | POA: Diagnosis not present

## 2018-11-22 DIAGNOSIS — E119 Type 2 diabetes mellitus without complications: Secondary | ICD-10-CM | POA: Diagnosis not present

## 2018-11-22 DIAGNOSIS — H0102B Squamous blepharitis left eye, upper and lower eyelids: Secondary | ICD-10-CM | POA: Diagnosis not present

## 2018-11-22 DIAGNOSIS — H5212 Myopia, left eye: Secondary | ICD-10-CM | POA: Diagnosis not present

## 2018-11-22 DIAGNOSIS — H0102A Squamous blepharitis right eye, upper and lower eyelids: Secondary | ICD-10-CM | POA: Diagnosis not present

## 2018-11-22 LAB — HM DIABETES EYE EXAM

## 2018-11-27 ENCOUNTER — Encounter: Payer: Self-pay | Admitting: Internal Medicine

## 2018-11-27 NOTE — Progress Notes (Signed)
Outside notes received. Information abstracted. Notes sent to scan.  

## 2018-12-22 ENCOUNTER — Telehealth: Payer: Self-pay | Admitting: Internal Medicine

## 2018-12-22 MED ORDER — HYDROXYZINE HCL 10 MG PO TABS: 10.0000 mg | ORAL_TABLET | Freq: Three times a day (TID) | ORAL | 1 refills | Status: DC | PRN

## 2018-12-22 NOTE — Telephone Encounter (Signed)
Caller name: Timothy Lasso  Relation to pt: daughter  Call back number: [phone]  Pharmacy: [facility] [address] [place] [phone] (Phone) [phone] (Fax)     Reason for call:  Daughter unsure the name of medication but states PCP prescribed due to patient itching, daughter requesting a refill today and would like to speak with a nurse.

## 2018-12-22 NOTE — Telephone Encounter (Signed)
RX sent daughter notified

## 2018-12-26 ENCOUNTER — Encounter: Payer: Self-pay | Admitting: Podiatry

## 2018-12-26 ENCOUNTER — Other Ambulatory Visit: Payer: Self-pay

## 2018-12-26 ENCOUNTER — Encounter

## 2018-12-26 ENCOUNTER — Ambulatory Visit: Payer: Medicare HMO | Admitting: Podiatry

## 2018-12-26 DIAGNOSIS — B351 Tinea unguium: Secondary | ICD-10-CM | POA: Insufficient documentation

## 2018-12-26 DIAGNOSIS — E1142 Type 2 diabetes mellitus with diabetic polyneuropathy: Secondary | ICD-10-CM | POA: Diagnosis not present

## 2018-12-26 DIAGNOSIS — E1151 Type 2 diabetes mellitus with diabetic peripheral angiopathy without gangrene: Secondary | ICD-10-CM | POA: Diagnosis not present

## 2018-12-26 DIAGNOSIS — E1140 Type 2 diabetes mellitus with diabetic neuropathy, unspecified: Secondary | ICD-10-CM | POA: Insufficient documentation

## 2018-12-26 DIAGNOSIS — M79674 Pain in right toe(s): Secondary | ICD-10-CM | POA: Diagnosis not present

## 2018-12-26 DIAGNOSIS — M79675 Pain in left toe(s): Secondary | ICD-10-CM | POA: Diagnosis not present

## 2018-12-26 NOTE — Progress Notes (Signed)
This patient presents to the office with chief complaint of long thick nails and diabetic feet.  This patient  says there  is  no pain and discomfort in her  feet. She presents to the office with her daughter.  She presents in a wheelchair. This patient says there are long thick painful nails.  These nails are painful  wearing shoes.  Patient has no history of infection or drainage from both feet.  Patient is unable to  self treat his own nails . This patient presents  to the office today for treatment of the  long nails and a foot evaluation due to history of  diabetes.  General Appearance  Alert, conversant and in no acute stress.  Vascular  Dorsalis pedis and posterior tibial  pulses are  Not  palpable  bilaterally.  Capillary return is within normal limits  bilaterally. Temperature is within normal limits  Bilaterally.  Swelling feet  B/l.  Neurologic  Senn-Weinstein monofilament wire test diminished   bilaterally. Muscle power within normal limits bilaterally.  Nails Thick disfigured discolored nails with subungual debris  from hallux to fifth toes bilaterally. No evidence of bacterial infection or drainage bilaterally.  Orthopedic  No limitations of motion of motion feet .  No crepitus or effusions noted.  No bony pathology or digital deformities noted.  Skin  normotropic skin with no porokeratosis noted bilaterally.  No signs of infections or ulcers noted.     Onychomycosis  Diabetes with angiopathy and neuropathy.  IE  Debride nails x 10.  A diabetic foot exam was performed and there is  evidence of  vascular or neurologic pathology.   RTC 3 months.   Gardiner Barefoot DPM

## 2019-01-11 ENCOUNTER — Ambulatory Visit: Payer: Medicare HMO | Admitting: Internal Medicine

## 2019-01-16 ENCOUNTER — Telehealth: Payer: Self-pay | Admitting: *Deleted

## 2019-01-16 MED ORDER — SILVER SULFADIAZINE 1 % EX CREA: 1.0000 "application " | TOPICAL_CREAM | Freq: Every day | CUTANEOUS | 0 refills | Status: DC

## 2019-01-16 NOTE — Telephone Encounter (Signed)
Please advise in PCP's absence. Thanks! 

## 2019-01-16 NOTE — Telephone Encounter (Signed)
sent 

## 2019-01-16 NOTE — Telephone Encounter (Signed)
Pt informed of below.  

## 2019-01-16 NOTE — Telephone Encounter (Signed)
Copied from [place] [id number]. Topic: Quick Communication - See Telephone Encounter >> Jan 15, 2019  5:47 PM Loma Boston wrote: CRM for notification. See Telephone encounter for: 01/15/19. PT daughter says that her mother is developing sores in crevices and wants to see if Dr Mamie Nick can write a script for SSD 1% silver sulfadiazine creme per the pharmacy suggestion. Please call her with questions [address] [place], [place] [address] [place] [address] Phone: [phone] Fax: [phone]

## 2019-01-30 ENCOUNTER — Ambulatory Visit (INDEPENDENT_AMBULATORY_CARE_PROVIDER_SITE_OTHER): Payer: Medicare HMO | Admitting: Internal Medicine

## 2019-01-30 ENCOUNTER — Other Ambulatory Visit (INDEPENDENT_AMBULATORY_CARE_PROVIDER_SITE_OTHER): Payer: Medicare HMO

## 2019-01-30 ENCOUNTER — Encounter: Payer: Self-pay | Admitting: Internal Medicine

## 2019-01-30 ENCOUNTER — Telehealth: Payer: Self-pay | Admitting: *Deleted

## 2019-01-30 ENCOUNTER — Other Ambulatory Visit: Payer: Self-pay

## 2019-01-30 VITALS — BP 158/80 | HR 86 | Temp 99.2°F | Ht 62.0 in | Wt 188.0 lb

## 2019-01-30 DIAGNOSIS — Z23 Encounter for immunization: Secondary | ICD-10-CM | POA: Diagnosis not present

## 2019-01-30 DIAGNOSIS — E119 Type 2 diabetes mellitus without complications: Secondary | ICD-10-CM

## 2019-01-30 DIAGNOSIS — E538 Deficiency of other specified B group vitamins: Secondary | ICD-10-CM

## 2019-01-30 DIAGNOSIS — H5789 Other specified disorders of eye and adnexa: Secondary | ICD-10-CM | POA: Insufficient documentation

## 2019-01-30 DIAGNOSIS — E559 Vitamin D deficiency, unspecified: Secondary | ICD-10-CM | POA: Diagnosis not present

## 2019-01-30 DIAGNOSIS — R269 Unspecified abnormalities of gait and mobility: Secondary | ICD-10-CM

## 2019-01-30 DIAGNOSIS — I10 Essential (primary) hypertension: Secondary | ICD-10-CM | POA: Diagnosis not present

## 2019-01-30 DIAGNOSIS — N183 Chronic kidney disease, stage 3 (moderate): Secondary | ICD-10-CM | POA: Diagnosis not present

## 2019-01-30 DIAGNOSIS — E539 Vitamin B deficiency, unspecified: Secondary | ICD-10-CM | POA: Diagnosis not present

## 2019-01-30 DIAGNOSIS — E1122 Type 2 diabetes mellitus with diabetic chronic kidney disease: Secondary | ICD-10-CM | POA: Diagnosis not present

## 2019-01-30 DIAGNOSIS — R531 Weakness: Secondary | ICD-10-CM | POA: Diagnosis not present

## 2019-01-30 LAB — HEMOGLOBIN A1C: Hgb A1c MFr Bld: 6.6 % — ABNORMAL HIGH (ref 4.6–6.5)

## 2019-01-30 LAB — BASIC METABOLIC PANEL
BUN: 17 mg/dL (ref 6–23)
CO2: 28 mEq/L (ref 19–32)
Calcium: 9.4 mg/dL (ref 8.4–10.5)
Chloride: 107 mEq/L (ref 96–112)
Creatinine, Ser: 0.81 mg/dL (ref 0.40–1.20)
GFR: 80.65 mL/min (ref >60.00)
Glucose, Bld: 136 mg/dL — ABNORMAL HIGH (ref 70–99)
Potassium: 4.7 mEq/L (ref 3.5–5.1)
Sodium: 142 mEq/L (ref 135–145)

## 2019-01-30 MED ORDER — NYSTATIN 100000 UNIT/GM EX POWD: CUTANEOUS | 0 refills | Status: DC

## 2019-01-30 MED ORDER — SILVER SULFADIAZINE 1 % EX CREA: 1.0000 "application " | TOPICAL_CREAM | Freq: Every day | CUTANEOUS | 0 refills | Status: DC

## 2019-01-30 MED ORDER — TOLTERODINE TARTRATE ER 4 MG PO CP24: 4.0000 mg | ORAL_CAPSULE | Freq: Every day | ORAL | 3 refills | Status: DC

## 2019-01-30 NOTE — Assessment & Plan Note (Signed)
In a w/c 

## 2019-01-30 NOTE — Assessment & Plan Note (Signed)
Vit D 

## 2019-01-30 NOTE — Progress Notes (Signed)
Subjective:  Patient ID: Holly Banks, female    DOB: September 20, 1928  Age: 85 y.o. MRN: [id number]  CC: No chief complaint on file.   HPI Holly Banks presents for HTN, DM, CRF, gait disorder f/u Moving to [place] w/dtr Magda Paganini in 45 days  Outpatient Medications Prior to Visit  Medication Sig Dispense Refill  . aspirin 81 MG tablet Take 81 mg by mouth daily.    . Cholecalciferol (EQL VITAMIN D3) 1000 UNITS tablet Take 1,000 Units by mouth daily. For supplement    . diclofenac sodium (VOLTAREN) 1 % GEL Apply 2 g topically 4 (four) times daily. 100 g 6  . hydrOXYzine (ATARAX/VISTARIL) 10 MG tablet Take 1 tablet (10 mg total) by mouth 3 (three) times daily as needed for itching. 180 tablet 1  . losartan (COZAAR) 50 MG tablet TAKE 1 TABLET EVERY DAY  FOR  HYPERTENSION 90 tablet 3  . metFORMIN (GLUCOPHAGE) 500 MG tablet Take 1 tablet (500 mg total) by mouth 2 (two) times daily with a meal. Must keep scheduled appt for future refills 180 tablet 3  . methylPREDNISolone (MEDROL DOSEPAK) 4 MG TBPK tablet As directed 21 tablet 0  . NIFEdipine (ADALAT CC) 60 MG 24 hr tablet Take 1 tablet (60 mg total) by mouth daily. 90 tablet 3  . Nutritional Supplements (ENSURE ORIGINAL) LIQD Take 1-3 Bottles by mouth daily. 90 Bottle 3  . nystatin (MYCOSTATIN/NYSTOP) powder Apply to area twice a day as needed 30 g 0  . potassium chloride (K-DUR) 10 MEQ tablet TAKE 1 TABLET EVERY DAY 90 tablet 1  . primidone (MYSOLINE) 50 MG tablet Take 2 tablets (100 mg total) by mouth 3 (three) times daily. 270 tablet 3  . repaglinide (PRANDIN) 1 MG tablet 1 tab ac qd-tid. Use Prandin (repaglinide) only if you eat 270 tablet 3  . silver sulfADIAZINE (SILVADENE) 1 % cream Apply 1 application topically daily. 50 g 0  . verapamil (VERELAN PM) 360 MG 24 hr capsule TAKE 1 CAPSULE AT BEDTIME  FOR  HYPERTENSION 90 capsule 3  . vitamin B-12 (CYANOCOBALAMIN) 1000 MCG tablet Take 1,000 mcg by mouth daily.       No facility-administered  medications prior to visit.     ROS: Review of Systems  Constitutional: Positive for fatigue and unexpected weight change. Negative for activity change, appetite change and chills.  HENT: Negative for congestion, mouth sores and sinus pressure.   Eyes: Negative for visual disturbance.  Respiratory: Negative for cough and chest tightness.   Gastrointestinal: Negative for abdominal pain and nausea.  Genitourinary: Negative for difficulty urinating, frequency and vaginal pain.  Musculoskeletal: Positive for arthralgias, back pain and gait problem.  Skin: Negative for pallor and rash.  Neurological: Positive for weakness. Negative for dizziness, tremors, numbness and headaches.  Psychiatric/Behavioral: Negative for confusion, sleep disturbance and suicidal ideas.    Objective:  Ht 5\' 2"  (1.575 m)   Wt 188 lb (85.3 kg)   BMI 34.39 kg/m   BP Readings from Last 3 Encounters:  12/26/18 (!) 190/97  07/12/18 132/82  05/25/18 136/80    Wt Readings from Last 3 Encounters:  01/30/19 188 lb (85.3 kg)  07/12/18 202 lb (91.6 kg)  05/25/18 208 lb (94.3 kg)    Physical Exam Constitutional:      General: She is not in acute distress.    Appearance: She is well-developed. She is obese.  HENT:     Head: Normocephalic.     Right Ear: External ear normal.  Left Ear: External ear normal.     Nose: Nose normal.  Eyes:     General:        Right eye: No discharge.        Left eye: No discharge.     Conjunctiva/sclera: Conjunctivae normal.     Pupils: Pupils are equal, round, and reactive to light.  Neck:     Musculoskeletal: Normal range of motion and neck supple.     Thyroid: No thyromegaly.     Vascular: No JVD.     Trachea: No tracheal deviation.  Cardiovascular:     Rate and Rhythm: Normal rate and regular rhythm.     Heart sounds: Normal heart sounds.  Pulmonary:     Effort: No respiratory distress.     Breath sounds: No stridor. No wheezing.  Abdominal:     General: Bowel  sounds are normal. There is no distension.     Palpations: Abdomen is soft. There is no mass.     Tenderness: There is no abdominal tenderness. There is no guarding or rebound.  Musculoskeletal:        General: Swelling and tenderness present.  Lymphadenopathy:     Cervical: No cervical adenopathy.  Skin:    Findings: No erythema or rash.  Neurological:     Cranial Nerves: No cranial nerve deficit.     Motor: Weakness present. No abnormal muscle tone.     Coordination: Coordination normal.     Gait: Gait abnormal.     Deep Tendon Reflexes: Reflexes normal.  Psychiatric:        Behavior: Behavior normal.        Thought Content: Thought content normal.        Judgment: Judgment normal.   B shoulders - restricted ROM B edema 1+ In a w/c  Lab Results  Component Value Date   WBC 4.7 07/12/2018   HGB 9.6 (L) 07/12/2018   HCT 30.3 (L) 07/12/2018   PLT 219.0 07/12/2018   GLUCOSE 170 (H) 07/12/2018   CHOL 198 05/04/2011   TRIG 116.0 05/04/2011   HDL 62.30 05/04/2011   LDLDIRECT 126.1 03/31/2010   LDLCALC 113 (H) 05/04/2011   ALT 6 07/12/2018   AST 14 07/12/2018   NA 139 07/12/2018   K 3.9 07/12/2018   CL 104 07/12/2018   CREATININE 0.92 07/12/2018   BUN 19 07/12/2018   CO2 26 07/12/2018   TSH 3.63 07/07/2017   INR 2.2 (H) 10/25/2007   HGBA1C 6.4 07/12/2018   MICROALBUR 11.9 (H) 08/23/2016    Vas [place] Lower Extremity Venous (dvt)  Result Date: 05/27/2018  Lower Venous Study Indications: Swelling.  Risk Factors: Obesity. Anticoagulation: Aspirin. Limitations: Body habitus. Performing Technologist: Guinevere Ferrari RVT  Examination Guidelines: A complete evaluation includes B-mode imaging, spectral Doppler, color Doppler, and power Doppler as needed of all accessible portions of each vessel. Bilateral testing is considered an integral part of a complete examination. Limited examinations for reoccurring indications may be performed as noted.  Right Venous Findings:  +---------+---------------+---------+-----------+----------+--------------+          CompressibilityPhasicitySpontaneityPropertiesSummary        +---------+---------------+---------+-----------+----------+--------------+ CFV      Full           Yes      Yes                                 +---------+---------------+---------+-----------+----------+--------------+ SFJ  Full           Yes      Yes                                 +---------+---------------+---------+-----------+----------+--------------+ FV Prox  Full           Yes      Yes                                 +---------+---------------+---------+-----------+----------+--------------+ FV Mid   Full                                                        +---------+---------------+---------+-----------+----------+--------------+ FV DistalFull           Yes      Yes                                 +---------+---------------+---------+-----------+----------+--------------+ PFV      Full                                                        +---------+---------------+---------+-----------+----------+--------------+ POP      Full           Yes      Yes                                 +---------+---------------+---------+-----------+----------+--------------+ PTV      Full           Yes      Yes                                 +---------+---------------+---------+-----------+----------+--------------+ PERO                                                  Not visualized +---------+---------------+---------+-----------+----------+--------------+ Gastroc  Full                                                        +---------+---------------+---------+-----------+----------+--------------+ GSV      Full           Yes      Yes                                 +---------+---------------+---------+-----------+----------+--------------+  Right Technical Findings: Not visualized  segments include calf veins.  Left Venous Findings: +---+---------------+---------+-----------+----------+-------+    CompressibilityPhasicitySpontaneityPropertiesSummary +---+---------------+---------+-----------+----------+-------+ CFVFull           Yes  Yes                          +---+---------------+---------+-----------+----------+-------+   Findings reported to Dr. Sharlet Salina at 11:36.  Summary: Right: No evidence of deep vein thrombosis in the lower extremity. No indirect evidence of obstruction proximal to the inguinal ligament. No cystic structure found in the popliteal fossa. Left: No evidence of common femoral vein obstruction.  *See table(s) above for measurements and observations. Electronically signed by Jenkins Rouge MD on 05/27/2018 at 3:27:20 PM.    Final     Assessment & Plan:   There are no diagnoses linked to this encounter.   No orders of the defined types were placed in this encounter.    Follow-up: No follow-ups on file.  [name], MD

## 2019-01-30 NOTE — Assessment & Plan Note (Signed)
F/u w/Dr Groat °

## 2019-01-30 NOTE — Assessment & Plan Note (Signed)
Prandin Labs

## 2019-01-30 NOTE — Patient Instructions (Signed)
Wt Readings from Last 3 Encounters:  01/30/19 188 lb (85.3 kg)  07/12/18 202 lb (91.6 kg)  05/25/18 208 lb (94.3 kg)

## 2019-01-30 NOTE — Telephone Encounter (Signed)
Noted.  I would rather start on Detrol LA.  It is generic.  I will email a prescription.

## 2019-01-30 NOTE — Telephone Encounter (Signed)
Patient is requesting a refill on Oxybutynin. This was d/c'd due to side effects. Ok to Rf?

## 2019-01-30 NOTE — Assessment & Plan Note (Signed)
On B12 

## 2019-01-30 NOTE — Assessment & Plan Note (Signed)
Losartan, Cardizem [facility]

## 2019-01-31 NOTE — Telephone Encounter (Signed)
Pt informed of below.  

## 2019-03-17 ENCOUNTER — Other Ambulatory Visit: Payer: Self-pay

## 2019-03-17 ENCOUNTER — Ambulatory Visit (INDEPENDENT_AMBULATORY_CARE_PROVIDER_SITE_OTHER): Payer: Medicare HMO

## 2019-03-17 DIAGNOSIS — Z23 Encounter for immunization: Secondary | ICD-10-CM | POA: Diagnosis not present

## 2019-03-22 ENCOUNTER — Telehealth: Payer: Self-pay | Admitting: Internal Medicine

## 2019-03-22 NOTE — Telephone Encounter (Signed)
Pt will need an OV to get order placed

## 2019-03-22 NOTE — Telephone Encounter (Signed)
Pt daughter is calling and is requesting hospital bed for her mom. Pt daughter will call us back with info medical supply place etc

## 2019-03-27 ENCOUNTER — Encounter: Payer: Self-pay | Admitting: Family

## 2019-03-27 ENCOUNTER — Ambulatory Visit (INDEPENDENT_AMBULATORY_CARE_PROVIDER_SITE_OTHER): Payer: Medicare HMO | Admitting: Family

## 2019-03-27 DIAGNOSIS — L89322 Pressure ulcer of left buttock, stage 2: Secondary | ICD-10-CM | POA: Diagnosis not present

## 2019-03-27 DIAGNOSIS — R269 Unspecified abnormalities of gait and mobility: Secondary | ICD-10-CM | POA: Diagnosis not present

## 2019-03-27 DIAGNOSIS — R531 Weakness: Secondary | ICD-10-CM

## 2019-03-27 NOTE — Progress Notes (Signed)
Holly Banks is a 85 y.o. female with the following history as recorded in EpicCare:  Patient Active Problem List   Diagnosis Date Noted  . Red eye 01/30/2019  . Pain due to onychomycosis of toenails of both feet 12/26/2018  . Diabetic neuropathy ([place]) 12/26/2018  . Toe pain, bilateral 10/11/2018  . Arthritis 07/12/2018  . Localized swelling of right foot 05/26/2018  . Change of skin color 05/26/2018  . Gait disorder 10/05/2017  . Decubitus skin ulcer 10/05/2017  . Tremor 07/07/2017  . Weakness 04/05/2017  . Chills (without fever) 08/23/2016  . Neck pain 05/24/2016  . Ganglion cyst of flexor tendon sheath of finger 08/22/2015  . Paresthesia 11/16/2013  . Acute blood loss anemia 07/19/2013  . Fracture of femur, right, closed ([place]) 07/11/2013  . URI (upper respiratory infection) 06/15/2013  . Cough 03/15/2013  . Vision loss, right eye 01/05/2012  . UTI (lower urinary tract infection) 03/31/2011  . Rash 11/05/2010  . SHOULDER PAIN 04/17/2009  . TOBACCO USE, QUIT 04/17/2009  . GASTRITIS 12/25/2008  . ABNORMAL EXAM-BILIARY TRACT 10/16/2008  . GASTROINTESTINAL XRAY, ABNORMAL 10/16/2008  . ANAL PRURITUS 09/17/2008  . Edema 09/17/2008  . OTHER DYSPHAGIA 09/17/2008  . HAND PAIN 09/07/2007  . Vitamin B 12 deficiency 07/05/2007  . PARESTHESIA 07/05/2007  . TINNITUS NOS 03/23/2007  . URINARY INCONTINENCE 03/21/2007  . DM type 2 causing CKD stage 3 ([place]) 01/16/2007  . Vitamin D deficiency 01/16/2007  . Gout 01/16/2007  . HYPERKALEMIA 01/16/2007  . OBESITY, MORBID 01/16/2007  . Essential hypertension 01/16/2007  . GERD 01/16/2007  . Osteoarthritis 01/16/2007  . SYMPTOM, INCONTINENCE, URINARY NEC 01/16/2007  . COLONIC POLYPS, HX OF 01/16/2007    Current Outpatient Medications  Medication Sig Dispense Refill  . aspirin 81 MG tablet Take 81 mg by mouth daily.    . Cholecalciferol (EQL VITAMIN D3) 1000 UNITS tablet Take 1,000 Units by mouth daily. For supplement    .  diclofenac sodium (VOLTAREN) 1 % GEL Apply 2 g topically 4 (four) times daily. 100 g 6  . hydrOXYzine (ATARAX/VISTARIL) 10 MG tablet Take 1 tablet (10 mg total) by mouth 3 (three) times daily as needed for itching. 180 tablet 1  . losartan (COZAAR) 50 MG tablet TAKE 1 TABLET EVERY DAY  FOR  HYPERTENSION 90 tablet 3  . metFORMIN (GLUCOPHAGE) 500 MG tablet Take 1 tablet (500 mg total) by mouth 2 (two) times daily with a meal. Must keep scheduled appt for future refills 180 tablet 3  . methylPREDNISolone (MEDROL DOSEPAK) 4 MG TBPK tablet As directed 21 tablet 0  . NIFEdipine (ADALAT CC) 60 MG 24 hr tablet Take 1 tablet (60 mg total) by mouth daily. 90 tablet 3  . Nutritional Supplements (ENSURE ORIGINAL) LIQD Take 1-3 Bottles by mouth daily. 90 Bottle 3  . nystatin (MYCOSTATIN/NYSTOP) powder Apply to area twice a day as needed 60 g 0  . potassium chloride (K-DUR) 10 MEQ tablet TAKE 1 TABLET EVERY DAY 90 tablet 1  . primidone (MYSOLINE) 50 MG tablet Take 2 tablets (100 mg total) by mouth 3 (three) times daily. 270 tablet 3  . repaglinide (PRANDIN) 1 MG tablet 1 tab ac qd-tid. Use Prandin (repaglinide) only if you eat 270 tablet 3  . silver sulfADIAZINE (SILVADENE) 1 % cream Apply 1 application topically daily. 50 g 0  . tolterodine (DETROL LA) 4 MG 24 hr capsule Take 1 capsule (4 mg total) by mouth daily. 90 capsule 3  . vitamin B-12 (CYANOCOBALAMIN) 1000  MCG tablet Take 1,000 mcg by mouth daily.       No current facility-administered medications for this visit.     Allergies: Ditropan [oxybutynin] and Penicillins  Past Medical History:  Diagnosis Date  . Diabetes mellitus    type II  . Diverticulosis   . Gastritis    w/ hx of h. pylori  . GERD (gastroesophageal reflux disease)   . Gout   . Hyperkalemia   . Hypertension   . Leiomyoma 2-02   colonic polypoid   . Morbid obesity ([place])   . Osteoarthritis   . Pain in joint, lower leg   . Unspecified vitamin D deficiency   . Urinary  incontinence     Past Surgical History:  Procedure Laterality Date  . CHOLECYSTECTOMY    . LITHOTRIPSY    . ORIF FEMUR FRACTURE N/A 07/12/2013   Procedure: OPEN REDUCTION INTERNAL FIXATION (ORIF) DISTAL FEMUR FRACTURE;  Surgeon: Hessie Dibble, MD;  Location: [facility];  Service: Orthopedics;  Laterality: N/A;  . right tkr    . thr left    . thr left 3/4    . TOTAL HIP ARTHROPLASTY  2009   right  . TOTAL HIP ARTHROPLASTY     left    Family History  Problem Relation Age of Onset  . Cancer Sister        breast, stomach  . Diabetes Sister   . Diabetes Other   . Colon cancer Neg Hx   . Stomach cancer Neg Hx   . Esophageal cancer Neg Hx     Social History   Tobacco Use  . Smoking status: Former Smoker    Quit date: 06/07/1960    Years since quitting: 58.8  . Smokeless tobacco: Never Used  Substance Use Topics  . Alcohol use: No    Subjective:   I connected with Vilinda Flake on 03/27/19 at 11:40 AM EDT by a video enabled telemedicine application and verified that I am speaking with the correct person using two identifiers.  Provider is in office; patient is at home; patient, daughter and provider are on the video call.    I discussed the limitations of evaluation and management by telemedicine and the availability of in person appointments. The patient expressed understanding and agreed to proceed.  Requesting order for hospital bed at home; per daughter, patient has spent the past 2 years in her lift chair; has actually been having to sleep in the lift chair as well; per daughter, the patient has been in the chair "24/7"; there are persisting pressure sores that are not healing and are due to having to sit in the same position; requesting hospital bed to allow patient to safely be in different positions to promote healing of the pressure sores.      Objective:  There were no vitals filed for this visit.  General: Well developed, well nourished, in no acute distress  Head:  Normocephalic and atraumatic  Lungs: Respirations unlabored;  Neurologic: Alert and oriented; speech intact; face symmetrical;   Assessment:  1. Gait disorder   2. Weakness   3. Pressure injury of left buttock, stage 2 ([place])     Plan:  Agree with request for hospital bed; order will be placed; daughter is aware that home health agency will be in touch about getting the bed delivered.   No follow-ups on file.  No orders of the defined types were placed in this encounter.   Requested Prescriptions    No prescriptions  requested or ordered in this encounter     

## 2019-04-03 ENCOUNTER — Ambulatory Visit: Payer: Medicare HMO | Admitting: Podiatry

## 2019-04-26 ENCOUNTER — Telehealth: Payer: Self-pay | Admitting: Internal Medicine

## 2019-04-26 NOTE — Telephone Encounter (Signed)
Copied from [place] [id number]. Topic: General - Other >> Apr 26, 2019  3:59 PM Celene Kras wrote: Reason for CRM: Pts daughter called stating pt is in bad shape. Pts daughter is requesting to have a hh aid come out to help pt. She states pt has a hard time walking, standing up, and using the bathroom by herself. Please advise.

## 2019-04-27 NOTE — Telephone Encounter (Signed)
Please advise 

## 2019-04-27 NOTE — Telephone Encounter (Signed)
Pt's daughter calling.  States that she needs to hear something soon.

## 2019-04-29 NOTE — Telephone Encounter (Signed)
Would she like a home care nurse visit with PT/OT to follow? Thanks

## 2019-05-01 NOTE — Telephone Encounter (Signed)
Daughter called to speak with Dr. Judeen Hammans nurse she stated that the Pt is at home by herslf and has used the bathroom on herself with no one to change her

## 2019-05-01 NOTE — Telephone Encounter (Signed)
Patient is calling in regards to her mother would like a call back from Dr Alain Marion nurse.   Call back [phone] Timothy Lasso

## 2019-05-01 NOTE — Telephone Encounter (Signed)
Pts daughter, Linwood Dibbles, called stating she is very concerned for her mother. She states her mother has been sitting in the same spot for 2 days. Pts daughter states she is not having any help and that her mother is not living in very good living conditions. Pts daughter states she is wanting a home care nurse and PT. Pts daughter states she is needing a nurse around the clock if its possible for at least 12 hours. Pts daughter states there are other issues and is requesting to speak with PCP. Please advise.   Callback # [phone]  Pts son# [phone]  Pts daughter (Stephanie)# [phone]

## 2019-05-02 ENCOUNTER — Other Ambulatory Visit: Payer: Self-pay | Admitting: Internal Medicine

## 2019-05-02 NOTE — Telephone Encounter (Signed)
This type of care is usually not provided by home care agencies.  Home care agencies can provide RN short  Visits for assessment, physical therapy, occupational therapy.  The family can hire  A home health aide privately.  If she has Medicaid we can arrange for personal care services via the state. Let me know Thanks

## 2019-05-02 NOTE — Telephone Encounter (Signed)
Daughter notified. And will call social services to see if patient is eligible for medicaid

## 2019-05-07 ENCOUNTER — Other Ambulatory Visit (INDEPENDENT_AMBULATORY_CARE_PROVIDER_SITE_OTHER): Payer: Medicare HMO

## 2019-05-07 ENCOUNTER — Ambulatory Visit (INDEPENDENT_AMBULATORY_CARE_PROVIDER_SITE_OTHER): Payer: Medicare HMO | Admitting: Internal Medicine

## 2019-05-07 ENCOUNTER — Encounter: Payer: Self-pay | Admitting: Internal Medicine

## 2019-05-07 ENCOUNTER — Other Ambulatory Visit: Payer: Self-pay

## 2019-05-07 DIAGNOSIS — I10 Essential (primary) hypertension: Secondary | ICD-10-CM

## 2019-05-07 DIAGNOSIS — N1832 Chronic kidney disease, stage 3b: Secondary | ICD-10-CM

## 2019-05-07 DIAGNOSIS — E119 Type 2 diabetes mellitus without complications: Secondary | ICD-10-CM | POA: Diagnosis not present

## 2019-05-07 DIAGNOSIS — R269 Unspecified abnormalities of gait and mobility: Secondary | ICD-10-CM

## 2019-05-07 DIAGNOSIS — E538 Deficiency of other specified B group vitamins: Secondary | ICD-10-CM

## 2019-05-07 DIAGNOSIS — E1121 Type 2 diabetes mellitus with diabetic nephropathy: Secondary | ICD-10-CM | POA: Diagnosis not present

## 2019-05-07 DIAGNOSIS — R627 Adult failure to thrive: Secondary | ICD-10-CM | POA: Diagnosis not present

## 2019-05-07 DIAGNOSIS — E559 Vitamin D deficiency, unspecified: Secondary | ICD-10-CM | POA: Diagnosis not present

## 2019-05-07 DIAGNOSIS — E1122 Type 2 diabetes mellitus with diabetic chronic kidney disease: Secondary | ICD-10-CM

## 2019-05-07 LAB — CBC WITH DIFFERENTIAL/PLATELET
Basophils Absolute: 0 10*3/uL (ref 0.0–0.1)
Basophils Relative: 0.4 % (ref 0.0–3.0)
Eosinophils Absolute: 0.1 10*3/uL (ref 0.0–0.7)
Eosinophils Relative: 1.2 % (ref 0.0–5.0)
HCT: 33.6 % — ABNORMAL LOW (ref 36.0–46.0)
Hemoglobin: 10.5 g/dL — ABNORMAL LOW (ref 12.0–15.0)
Lymphocytes Relative: 17 % (ref 12.0–46.0)
Lymphs Abs: 1.1 10*3/uL (ref 0.7–4.0)
MCHC: 31.3 g/dL (ref 30.0–36.0)
MCV: 83.7 fl (ref 78.0–100.0)
Monocytes Absolute: 0.5 10*3/uL (ref 0.1–1.0)
Monocytes Relative: 7 % (ref 3.0–12.0)
Neutro Abs: 4.8 10*3/uL (ref 1.4–7.7)
Neutrophils Relative %: 74.4 % (ref 43.0–77.0)
Platelets: 197 10*3/uL (ref 150.0–400.0)
RBC: 4.02 Mil/uL (ref 3.87–5.11)
RDW: 13.6 % (ref 11.5–15.5)
WBC: 6.5 10*3/uL (ref 4.0–10.5)

## 2019-05-07 LAB — BASIC METABOLIC PANEL
BUN: 19 mg/dL (ref 6–23)
CO2: 29 mEq/L (ref 19–32)
Calcium: 9.7 mg/dL (ref 8.4–10.5)
Chloride: 103 mEq/L (ref 96–112)
Creatinine, Ser: 0.74 mg/dL (ref 0.40–1.20)
GFR: 89.46 mL/min (ref >60.00)
Glucose, Bld: 282 mg/dL — ABNORMAL HIGH (ref 70–99)
Potassium: 4 mEq/L (ref 3.5–5.1)
Sodium: 140 mEq/L (ref 135–145)

## 2019-05-07 LAB — VITAMIN B12: Vitamin B-12: >1500 pg/mL — ABNORMAL HIGH (ref 211–911)

## 2019-05-07 LAB — HEMOGLOBIN A1C: Hgb A1c MFr Bld: 6.7 % — ABNORMAL HIGH (ref 4.6–6.5)

## 2019-05-07 LAB — TSH: TSH: 3.32 u[IU]/mL (ref 0.35–4.50)

## 2019-05-07 MED ORDER — SILVER SULFADIAZINE 1 % EX CREA: 1.0000 "application " | TOPICAL_CREAM | Freq: Every day | CUTANEOUS | 0 refills | Status: DC

## 2019-05-07 NOTE — Assessment & Plan Note (Signed)
  FTT: unable to walk, stand, transfer... Holly Banks is w/her today  Lives on her own. Hospital bed will be delivered tomorrow

## 2019-05-07 NOTE — Assessment & Plan Note (Signed)
On Vit D 

## 2019-05-07 NOTE — Assessment & Plan Note (Signed)
On B12 

## 2019-05-07 NOTE — Assessment & Plan Note (Signed)
BP Readings from Last 3 Encounters:  05/07/19 126/80  01/30/19 (!) 158/80  12/26/18 (!) 190/97

## 2019-05-07 NOTE — Assessment & Plan Note (Signed)
Labs

## 2019-05-07 NOTE — Progress Notes (Signed)
Subjective:  Patient ID: Holly Banks, female    DOB: 05/09/1929  Age: 85 y.o. MRN: [id number]  CC: No chief complaint on file.   HPI Holly Banks presents for DM, HTN, OA f/u C/o FTT: unable to walk, stand, transfer... Jaclyn Prime is w/her today   Lives on her own. Hospital bed will be delivered tomorrow  Outpatient Medications Prior to Visit  Medication Sig Dispense Refill  . aspirin 81 MG tablet Take 81 mg by mouth daily.    . Cholecalciferol (EQL VITAMIN D3) 1000 UNITS tablet Take 1,000 Units by mouth daily. For supplement    . diclofenac sodium (VOLTAREN) 1 % GEL Apply 2 g topically 4 (four) times daily. 100 g 6  . hydrOXYzine (ATARAX/VISTARIL) 10 MG tablet Take 1 tablet (10 mg total) by mouth 3 (three) times daily as needed for itching. 180 tablet 1  . losartan (COZAAR) 50 MG tablet TAKE 1 TABLET EVERY DAY  FOR  HYPERTENSION 90 tablet 3  . metFORMIN (GLUCOPHAGE) 500 MG tablet Take 1 tablet (500 mg total) by mouth 2 (two) times daily with a meal. Must keep scheduled appt for future refills 180 tablet 3  . methylPREDNISolone (MEDROL DOSEPAK) 4 MG TBPK tablet As directed 21 tablet 0  . NIFEdipine (ADALAT CC) 60 MG 24 hr tablet Take 1 tablet (60 mg total) by mouth daily. 90 tablet 3  . Nutritional Supplements (ENSURE ORIGINAL) LIQD Take 1-3 Bottles by mouth daily. 90 Bottle 3  . nystatin (MYCOSTATIN/NYSTOP) powder Apply to area twice a day as needed 60 g 0  . potassium chloride (KLOR-CON) 10 MEQ tablet TAKE 1 TABLET EVERY DAY 90 tablet 3  . primidone (MYSOLINE) 50 MG tablet Take 2 tablets (100 mg total) by mouth 3 (three) times daily. 270 tablet 3  . repaglinide (PRANDIN) 1 MG tablet 1 tab ac qd-tid. Use Prandin (repaglinide) only if you eat 270 tablet 3  . silver sulfADIAZINE (SILVADENE) 1 % cream Apply 1 application topically daily. 50 g 0  . tolterodine (DETROL LA) 4 MG 24 hr capsule Take 1 capsule (4 mg total) by mouth daily. 90 capsule 3  . vitamin B-12 (CYANOCOBALAMIN)  1000 MCG tablet Take 1,000 mcg by mouth daily.       No facility-administered medications prior to visit.     ROS: Review of Systems  Constitutional: Positive for fatigue. Negative for activity change, appetite change, chills and unexpected weight change.  HENT: Negative for congestion, mouth sores and sinus pressure.   Eyes: Negative for visual disturbance.  Respiratory: Negative for cough and chest tightness.   Gastrointestinal: Negative for abdominal pain and nausea.  Genitourinary: Negative for difficulty urinating, frequency and vaginal pain.  Musculoskeletal: Positive for arthralgias, back pain and gait problem.  Skin: Negative for pallor and rash.  Neurological: Positive for weakness. Negative for dizziness, tremors, numbness and headaches.  Psychiatric/Behavioral: Negative for confusion, sleep disturbance and suicidal ideas. The patient is nervous/anxious.     Objective:  BP 126/80 (BP Location: Left Arm, Patient Position: Sitting, Cuff Size: Normal)   Pulse (!) 52   Temp 98.4 F (36.9 C) (Oral)   Ht 5\' 2"  (1.575 m)   SpO2 99%   BMI 34.39 kg/m   BP Readings from Last 3 Encounters:  05/07/19 126/80  01/30/19 (!) 158/80  12/26/18 (!) 190/97    Wt Readings from Last 3 Encounters:  01/30/19 188 lb (85.3 kg)  07/12/18 202 lb (91.6 kg)  05/25/18 208 lb (94.3 kg)    Physical  Exam Constitutional:      General: She is not in acute distress.    Appearance: She is well-developed. She is obese.  HENT:     Head: Normocephalic.     Right Ear: External ear normal.     Left Ear: External ear normal.     Nose: Nose normal.  Eyes:     General:        Right eye: No discharge.        Left eye: No discharge.     Conjunctiva/sclera: Conjunctivae normal.     Pupils: Pupils are equal, round, and reactive to light.  Neck:     Musculoskeletal: Normal range of motion and neck supple.     Thyroid: No thyromegaly.     Vascular: No JVD.     Trachea: No tracheal deviation.   Cardiovascular:     Rate and Rhythm: Normal rate and regular rhythm.     Heart sounds: Normal heart sounds.  Pulmonary:     Effort: No respiratory distress.     Breath sounds: No stridor. No wheezing.  Abdominal:     General: Bowel sounds are normal. There is no distension.     Palpations: Abdomen is soft. There is no mass.     Tenderness: There is no abdominal tenderness. There is no guarding or rebound.  Musculoskeletal:        General: Tenderness present.  Lymphadenopathy:     Cervical: No cervical adenopathy.  Skin:    Findings: No erythema or rash.  Neurological:     Cranial Nerves: No cranial nerve deficit.     Motor: Weakness present. No abnormal muscle tone.     Coordination: Coordination normal.     Gait: Gait abnormal.     Deep Tendon Reflexes: Reflexes normal.  Psychiatric:        Behavior: Behavior normal.        Thought Content: Thought content normal.        Judgment: Judgment normal.    In a w/c Weak legs  Lab Results  Component Value Date   WBC 4.7 07/12/2018   HGB 9.6 (L) 07/12/2018   HCT 30.3 (L) 07/12/2018   PLT 219.0 07/12/2018   GLUCOSE 136 (H) 01/30/2019   CHOL 198 05/04/2011   TRIG 116.0 05/04/2011   HDL 62.30 05/04/2011   LDLDIRECT 126.1 03/31/2010   LDLCALC 113 (H) 05/04/2011   ALT 6 07/12/2018   AST 14 07/12/2018   NA 142 01/30/2019   K 4.7 01/30/2019   CL 107 01/30/2019   CREATININE 0.81 01/30/2019   BUN 17 01/30/2019   CO2 28 01/30/2019   TSH 3.63 07/07/2017   INR 2.2 (H) 10/25/2007   HGBA1C 6.6 (H) 01/30/2019   MICROALBUR 11.9 (H) 08/23/2016    Vas [place] Lower Extremity Venous (dvt)  Result Date: 05/27/2018  Lower Venous Study Indications: Swelling.  Risk Factors: Obesity. Anticoagulation: Aspirin. Limitations: Body habitus. Performing Technologist: Guinevere Ferrari RVT  Examination Guidelines: A complete evaluation includes B-mode imaging, spectral Doppler, color Doppler, and power Doppler as needed of all accessible portions of each  vessel. Bilateral testing is considered an integral part of a complete examination. Limited examinations for reoccurring indications may be performed as noted.  Right Venous Findings: +---------+---------------+---------+-----------+----------+--------------+          CompressibilityPhasicitySpontaneityPropertiesSummary        +---------+---------------+---------+-----------+----------+--------------+ CFV      Full           Yes      Yes                                 +---------+---------------+---------+-----------+----------+--------------+  SFJ      Full           Yes      Yes                                 +---------+---------------+---------+-----------+----------+--------------+ FV Prox  Full           Yes      Yes                                 +---------+---------------+---------+-----------+----------+--------------+ FV Mid   Full                                                        +---------+---------------+---------+-----------+----------+--------------+ FV DistalFull           Yes      Yes                                 +---------+---------------+---------+-----------+----------+--------------+ PFV      Full                                                        +---------+---------------+---------+-----------+----------+--------------+ POP      Full           Yes      Yes                                 +---------+---------------+---------+-----------+----------+--------------+ PTV      Full           Yes      Yes                                 +---------+---------------+---------+-----------+----------+--------------+ PERO                                                  Not visualized +---------+---------------+---------+-----------+----------+--------------+ Gastroc  Full                                                        +---------+---------------+---------+-----------+----------+--------------+ GSV      Full            Yes      Yes                                 +---------+---------------+---------+-----------+----------+--------------+  Right Technical Findings: Not visualized segments include calf veins.  Left Venous Findings: +---+---------------+---------+-----------+----------+-------+    CompressibilityPhasicitySpontaneityPropertiesSummary +---+---------------+---------+-----------+----------+-------+ CFVFull  Yes      Yes                          +---+---------------+---------+-----------+----------+-------+   Findings reported to Dr. Sharlet Salina at 11:36.  Summary: Right: No evidence of deep vein thrombosis in the lower extremity. No indirect evidence of obstruction proximal to the inguinal ligament. No cystic structure found in the popliteal fossa. Left: No evidence of common femoral vein obstruction.  *See table(s) above for measurements and observations. Electronically signed by Jenkins Rouge MD on 05/27/2018 at 3:27:20 PM.    Final     Assessment & Plan:   There are no diagnoses linked to this encounter.   No orders of the defined types were placed in this encounter.    Follow-up: No follow-ups on file.  [name], MD

## 2019-05-07 NOTE — Assessment & Plan Note (Addendum)
Trying to get Medicaid FTT: unable to walk, stand, transfer... Holly Banks is w/her today  Lives on her own. Hospital bed will be delivered tomorrow Advanced directives/POA papers given

## 2019-05-08 ENCOUNTER — Telehealth: Payer: Self-pay | Admitting: Internal Medicine

## 2019-05-08 NOTE — Telephone Encounter (Signed)
The family needs to sort out their difference of opinions on their own.  We have to comply with HIPPA rules. Thanks
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
## 2019-05-09 ENCOUNTER — Telehealth: Payer: Self-pay | Admitting: Internal Medicine

## 2019-05-09 NOTE — Telephone Encounter (Signed)
Daughter notified who agency was.  She would also like to know if it would be okay to place a referral to get patient therapy in her house to assist her with walking.

## 2019-05-09 NOTE — Telephone Encounter (Signed)
See other TE.

## 2019-05-09 NOTE — Telephone Encounter (Signed)
Copied from [place] [id number]. Topic: General - Other >> May 09, 2019 12:41 PM Keene Breath wrote: Reason for CRM: Called to ask the nurse to give her a call to discuss the name of the company that the order for a bed was placed with.  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  CB# [phone]

## 2019-05-10 ENCOUNTER — Telehealth: Payer: Self-pay | Admitting: Internal Medicine

## 2019-05-10 DIAGNOSIS — L89322 Pressure ulcer of left buttock, stage 2: Secondary | ICD-10-CM

## 2019-05-10 DIAGNOSIS — R269 Unspecified abnormalities of gait and mobility: Secondary | ICD-10-CM

## 2019-05-10 DIAGNOSIS — R531 Weakness: Secondary | ICD-10-CM

## 2019-05-10 NOTE — Telephone Encounter (Signed)
Order redone

## 2019-05-10 NOTE — Telephone Encounter (Signed)
Pt's daughter stated they need a new order sent to [facility] for a full electric bed as the hand crank will cause back problems for care givers. Also if a new order is needed for gel padding to go with the electric bed please send that as well. Please advise.

## 2019-05-14 NOTE — Telephone Encounter (Signed)
Thx

## 2019-05-21 ENCOUNTER — Telehealth: Payer: Self-pay | Admitting: Internal Medicine

## 2019-05-21 DIAGNOSIS — E1140 Type 2 diabetes mellitus with diabetic neuropathy, unspecified: Secondary | ICD-10-CM | POA: Diagnosis not present

## 2019-05-21 DIAGNOSIS — L89322 Pressure ulcer of left buttock, stage 2: Secondary | ICD-10-CM | POA: Diagnosis not present

## 2019-05-21 DIAGNOSIS — N39498 Other specified urinary incontinence: Secondary | ICD-10-CM | POA: Diagnosis not present

## 2019-05-21 DIAGNOSIS — M1990 Unspecified osteoarthritis, unspecified site: Secondary | ICD-10-CM | POA: Diagnosis not present

## 2019-05-21 NOTE — Telephone Encounter (Signed)
Home Health Verbal Orders - Caller/Agency: Lattie Haw from [facility] Number: [phone] Requesting OT/PT/Skilled Nursing/Social Work/Speech Therapy: PT request  Frequency:  3 times a week

## 2019-05-22 NOTE — Telephone Encounter (Signed)
LM giving verbals, FYI 

## 2019-05-27 DIAGNOSIS — I129 Hypertensive chronic kidney disease with stage 1 through stage 4 chronic kidney disease, or unspecified chronic kidney disease: Secondary | ICD-10-CM | POA: Diagnosis not present

## 2019-05-27 DIAGNOSIS — E1122 Type 2 diabetes mellitus with diabetic chronic kidney disease: Secondary | ICD-10-CM | POA: Diagnosis not present

## 2019-05-27 DIAGNOSIS — R627 Adult failure to thrive: Secondary | ICD-10-CM | POA: Diagnosis not present

## 2019-05-27 DIAGNOSIS — L89322 Pressure ulcer of left buttock, stage 2: Secondary | ICD-10-CM | POA: Diagnosis not present

## 2019-05-27 DIAGNOSIS — E669 Obesity, unspecified: Secondary | ICD-10-CM | POA: Diagnosis not present

## 2019-05-27 DIAGNOSIS — M549 Dorsalgia, unspecified: Secondary | ICD-10-CM | POA: Diagnosis not present

## 2019-05-27 DIAGNOSIS — E538 Deficiency of other specified B group vitamins: Secondary | ICD-10-CM | POA: Diagnosis not present

## 2019-05-27 DIAGNOSIS — E559 Vitamin D deficiency, unspecified: Secondary | ICD-10-CM | POA: Diagnosis not present

## 2019-05-27 DIAGNOSIS — N1832 Chronic kidney disease, stage 3b: Secondary | ICD-10-CM | POA: Diagnosis not present

## 2019-05-28 DIAGNOSIS — E669 Obesity, unspecified: Secondary | ICD-10-CM | POA: Diagnosis not present

## 2019-05-28 DIAGNOSIS — L89322 Pressure ulcer of left buttock, stage 2: Secondary | ICD-10-CM | POA: Diagnosis not present

## 2019-05-28 DIAGNOSIS — N1832 Chronic kidney disease, stage 3b: Secondary | ICD-10-CM | POA: Diagnosis not present

## 2019-05-28 DIAGNOSIS — E559 Vitamin D deficiency, unspecified: Secondary | ICD-10-CM | POA: Diagnosis not present

## 2019-05-28 DIAGNOSIS — E1122 Type 2 diabetes mellitus with diabetic chronic kidney disease: Secondary | ICD-10-CM | POA: Diagnosis not present

## 2019-05-28 DIAGNOSIS — M549 Dorsalgia, unspecified: Secondary | ICD-10-CM | POA: Diagnosis not present

## 2019-05-28 DIAGNOSIS — R627 Adult failure to thrive: Secondary | ICD-10-CM | POA: Diagnosis not present

## 2019-05-28 DIAGNOSIS — I129 Hypertensive chronic kidney disease with stage 1 through stage 4 chronic kidney disease, or unspecified chronic kidney disease: Secondary | ICD-10-CM | POA: Diagnosis not present

## 2019-05-28 DIAGNOSIS — E538 Deficiency of other specified B group vitamins: Secondary | ICD-10-CM | POA: Diagnosis not present

## 2019-05-29 ENCOUNTER — Telehealth: Payer: Self-pay | Admitting: Internal Medicine

## 2019-05-29 NOTE — Telephone Encounter (Signed)
Verbals given, FYI 

## 2019-05-29 NOTE — Telephone Encounter (Signed)
Home Health Verbal Orders - Caller/Agency: Amy Moon/ [facility] Number: [phone] Requesting OT/PT/Skilled Nursing/Social Work/Speech Therapy: PT Frequency: 1x 4 weeks

## 2019-05-30 ENCOUNTER — Other Ambulatory Visit: Payer: Self-pay | Admitting: Internal Medicine

## 2019-05-30 DIAGNOSIS — E1122 Type 2 diabetes mellitus with diabetic chronic kidney disease: Secondary | ICD-10-CM | POA: Diagnosis not present

## 2019-05-30 DIAGNOSIS — E559 Vitamin D deficiency, unspecified: Secondary | ICD-10-CM | POA: Diagnosis not present

## 2019-05-30 DIAGNOSIS — E669 Obesity, unspecified: Secondary | ICD-10-CM | POA: Diagnosis not present

## 2019-05-30 DIAGNOSIS — I129 Hypertensive chronic kidney disease with stage 1 through stage 4 chronic kidney disease, or unspecified chronic kidney disease: Secondary | ICD-10-CM | POA: Diagnosis not present

## 2019-05-30 DIAGNOSIS — M549 Dorsalgia, unspecified: Secondary | ICD-10-CM | POA: Diagnosis not present

## 2019-05-30 DIAGNOSIS — E538 Deficiency of other specified B group vitamins: Secondary | ICD-10-CM | POA: Diagnosis not present

## 2019-05-30 DIAGNOSIS — R627 Adult failure to thrive: Secondary | ICD-10-CM | POA: Diagnosis not present

## 2019-05-30 DIAGNOSIS — L89322 Pressure ulcer of left buttock, stage 2: Secondary | ICD-10-CM | POA: Diagnosis not present

## 2019-05-30 DIAGNOSIS — N1832 Chronic kidney disease, stage 3b: Secondary | ICD-10-CM | POA: Diagnosis not present

## 2019-06-04 DIAGNOSIS — E559 Vitamin D deficiency, unspecified: Secondary | ICD-10-CM | POA: Diagnosis not present

## 2019-06-04 DIAGNOSIS — E669 Obesity, unspecified: Secondary | ICD-10-CM | POA: Diagnosis not present

## 2019-06-04 DIAGNOSIS — L89322 Pressure ulcer of left buttock, stage 2: Secondary | ICD-10-CM | POA: Diagnosis not present

## 2019-06-04 DIAGNOSIS — N1832 Chronic kidney disease, stage 3b: Secondary | ICD-10-CM | POA: Diagnosis not present

## 2019-06-04 DIAGNOSIS — E1122 Type 2 diabetes mellitus with diabetic chronic kidney disease: Secondary | ICD-10-CM | POA: Diagnosis not present

## 2019-06-04 DIAGNOSIS — I129 Hypertensive chronic kidney disease with stage 1 through stage 4 chronic kidney disease, or unspecified chronic kidney disease: Secondary | ICD-10-CM | POA: Diagnosis not present

## 2019-06-04 DIAGNOSIS — E538 Deficiency of other specified B group vitamins: Secondary | ICD-10-CM | POA: Diagnosis not present

## 2019-06-04 DIAGNOSIS — M549 Dorsalgia, unspecified: Secondary | ICD-10-CM | POA: Diagnosis not present

## 2019-06-04 DIAGNOSIS — R627 Adult failure to thrive: Secondary | ICD-10-CM | POA: Diagnosis not present

## 2019-06-11 ENCOUNTER — Telehealth: Payer: Self-pay | Admitting: Internal Medicine

## 2019-06-11 DIAGNOSIS — L89322 Pressure ulcer of left buttock, stage 2: Secondary | ICD-10-CM | POA: Diagnosis not present

## 2019-06-11 DIAGNOSIS — Z9181 History of falling: Secondary | ICD-10-CM

## 2019-06-11 DIAGNOSIS — Z6838 Body mass index (BMI) 38.0-38.9, adult: Secondary | ICD-10-CM

## 2019-06-11 DIAGNOSIS — Z7984 Long term (current) use of oral hypoglycemic drugs: Secondary | ICD-10-CM

## 2019-06-11 DIAGNOSIS — E1122 Type 2 diabetes mellitus with diabetic chronic kidney disease: Secondary | ICD-10-CM | POA: Diagnosis not present

## 2019-06-11 DIAGNOSIS — Z79899 Other long term (current) drug therapy: Secondary | ICD-10-CM

## 2019-06-11 DIAGNOSIS — Z7982 Long term (current) use of aspirin: Secondary | ICD-10-CM

## 2019-06-11 DIAGNOSIS — R627 Adult failure to thrive: Secondary | ICD-10-CM | POA: Diagnosis not present

## 2019-06-11 DIAGNOSIS — E559 Vitamin D deficiency, unspecified: Secondary | ICD-10-CM | POA: Diagnosis not present

## 2019-06-11 DIAGNOSIS — E538 Deficiency of other specified B group vitamins: Secondary | ICD-10-CM | POA: Diagnosis not present

## 2019-06-11 DIAGNOSIS — I129 Hypertensive chronic kidney disease with stage 1 through stage 4 chronic kidney disease, or unspecified chronic kidney disease: Secondary | ICD-10-CM | POA: Diagnosis not present

## 2019-06-11 DIAGNOSIS — N1832 Chronic kidney disease, stage 3b: Secondary | ICD-10-CM | POA: Diagnosis not present

## 2019-06-11 DIAGNOSIS — M549 Dorsalgia, unspecified: Secondary | ICD-10-CM | POA: Diagnosis not present

## 2019-06-11 DIAGNOSIS — E669 Obesity, unspecified: Secondary | ICD-10-CM | POA: Diagnosis not present

## 2019-06-11 NOTE — Telephone Encounter (Signed)
Home Health Verbal Orders - Caller/Agency: Milinda Pointer Number: [phone] Requesting OT/PT/Skilled Nursing/Social Work/Speech Therapy: Amy called to report that pt had a fall this weekend. Pt complained of slight pain on knees and soreness.  Frequency:

## 2019-06-12 ENCOUNTER — Telehealth: Payer: Self-pay | Admitting: Internal Medicine

## 2019-06-12 DIAGNOSIS — E538 Deficiency of other specified B group vitamins: Secondary | ICD-10-CM | POA: Diagnosis not present

## 2019-06-12 DIAGNOSIS — M549 Dorsalgia, unspecified: Secondary | ICD-10-CM | POA: Diagnosis not present

## 2019-06-12 DIAGNOSIS — R627 Adult failure to thrive: Secondary | ICD-10-CM | POA: Diagnosis not present

## 2019-06-12 DIAGNOSIS — L89322 Pressure ulcer of left buttock, stage 2: Secondary | ICD-10-CM | POA: Diagnosis not present

## 2019-06-12 DIAGNOSIS — N1832 Chronic kidney disease, stage 3b: Secondary | ICD-10-CM | POA: Diagnosis not present

## 2019-06-12 DIAGNOSIS — E1122 Type 2 diabetes mellitus with diabetic chronic kidney disease: Secondary | ICD-10-CM | POA: Diagnosis not present

## 2019-06-12 DIAGNOSIS — I129 Hypertensive chronic kidney disease with stage 1 through stage 4 chronic kidney disease, or unspecified chronic kidney disease: Secondary | ICD-10-CM | POA: Diagnosis not present

## 2019-06-12 DIAGNOSIS — E559 Vitamin D deficiency, unspecified: Secondary | ICD-10-CM | POA: Diagnosis not present

## 2019-06-12 DIAGNOSIS — R531 Weakness: Secondary | ICD-10-CM

## 2019-06-12 DIAGNOSIS — R251 Tremor, unspecified: Secondary | ICD-10-CM

## 2019-06-12 DIAGNOSIS — R269 Unspecified abnormalities of gait and mobility: Secondary | ICD-10-CM

## 2019-06-12 DIAGNOSIS — E669 Obesity, unspecified: Secondary | ICD-10-CM | POA: Diagnosis not present

## 2019-06-12 NOTE — Telephone Encounter (Signed)
Okay.  Thanks.

## 2019-06-12 NOTE — Telephone Encounter (Signed)
Nori Riis with [facility] calling.  States pt needs an order for a [employer] lift called into whatever DME company she uses.

## 2019-06-12 NOTE — Telephone Encounter (Signed)
FYI

## 2019-06-13 ENCOUNTER — Telehealth: Payer: Self-pay

## 2019-06-13 NOTE — Telephone Encounter (Signed)
Generated DME, MD signed and faxed to advanced.Marland KitchenJohny Chess

## 2019-06-13 NOTE — Telephone Encounter (Signed)
New message   Verbal orders for pressure ulcer. Also requesting referral to wound clinic

## 2019-06-13 NOTE — Telephone Encounter (Signed)
pressure wound on coccyx  and would like recommendation for treatment and referral for wound care. Please advise

## 2019-06-13 NOTE — Telephone Encounter (Signed)
Copied from [place] [id number]. Topic: General - Inquiry >> Jun 12, 2019  4:15 PM Alease Frame wrote: Reason for CRM: Patient Nurse called in to speak with Idamae Schuller or his nurse . Please advise   Call back [phone]

## 2019-06-15 NOTE — Telephone Encounter (Signed)
Since she already has home health in place, will they take a verbal order for wound care evaluation?

## 2019-06-15 NOTE — Telephone Encounter (Signed)
Okay verbal order.  Use Tegaderm patch-change once every 3 to 4 days.  Thanks

## 2019-06-15 NOTE — Telephone Encounter (Signed)
MD is out of the office. Pls advise on msg below.Marland KitchenJohny Banks

## 2019-06-15 NOTE — Telephone Encounter (Signed)
Notified Betty w/MD response.Marland KitchenJohny Chess

## 2019-06-15 NOTE — Telephone Encounter (Signed)
Holly Banks called in checking on status of referral and to see what PCP would like them to do in regards to her wound in the meantime. Please advise and call back is [phone].

## 2019-06-15 NOTE — Telephone Encounter (Signed)
Holly Banks, Dr. Alain Marion answered but sent to [facility] and me. I am going to route to you too. Thanks for your help.

## 2019-06-18 ENCOUNTER — Telehealth: Payer: Self-pay | Admitting: Internal Medicine

## 2019-06-18 DIAGNOSIS — E559 Vitamin D deficiency, unspecified: Secondary | ICD-10-CM | POA: Diagnosis not present

## 2019-06-18 DIAGNOSIS — M549 Dorsalgia, unspecified: Secondary | ICD-10-CM | POA: Diagnosis not present

## 2019-06-18 DIAGNOSIS — I129 Hypertensive chronic kidney disease with stage 1 through stage 4 chronic kidney disease, or unspecified chronic kidney disease: Secondary | ICD-10-CM | POA: Diagnosis not present

## 2019-06-18 DIAGNOSIS — R627 Adult failure to thrive: Secondary | ICD-10-CM | POA: Diagnosis not present

## 2019-06-18 DIAGNOSIS — N1832 Chronic kidney disease, stage 3b: Secondary | ICD-10-CM | POA: Diagnosis not present

## 2019-06-18 DIAGNOSIS — L89322 Pressure ulcer of left buttock, stage 2: Secondary | ICD-10-CM | POA: Diagnosis not present

## 2019-06-18 DIAGNOSIS — E1122 Type 2 diabetes mellitus with diabetic chronic kidney disease: Secondary | ICD-10-CM | POA: Diagnosis not present

## 2019-06-18 DIAGNOSIS — E669 Obesity, unspecified: Secondary | ICD-10-CM | POA: Diagnosis not present

## 2019-06-18 DIAGNOSIS — E538 Deficiency of other specified B group vitamins: Secondary | ICD-10-CM | POA: Diagnosis not present

## 2019-06-18 NOTE — Telephone Encounter (Signed)
Home Health Verbal Orders - Caller/Agency: Elly [facility] Number: [phone] Requesting OT/PT/Skilled Nursing/Social Work/Speech Therapy: Requesting to move discharge until next week  Frequency:

## 2019-06-18 NOTE — Telephone Encounter (Signed)
Verbal orders given  

## 2019-06-21 DIAGNOSIS — R627 Adult failure to thrive: Secondary | ICD-10-CM | POA: Diagnosis not present

## 2019-06-21 DIAGNOSIS — N1832 Chronic kidney disease, stage 3b: Secondary | ICD-10-CM | POA: Diagnosis not present

## 2019-06-21 DIAGNOSIS — M549 Dorsalgia, unspecified: Secondary | ICD-10-CM | POA: Diagnosis not present

## 2019-06-21 DIAGNOSIS — N39498 Other specified urinary incontinence: Secondary | ICD-10-CM | POA: Diagnosis not present

## 2019-06-21 DIAGNOSIS — R531 Weakness: Secondary | ICD-10-CM | POA: Diagnosis not present

## 2019-06-21 DIAGNOSIS — M1990 Unspecified osteoarthritis, unspecified site: Secondary | ICD-10-CM | POA: Diagnosis not present

## 2019-06-21 DIAGNOSIS — E669 Obesity, unspecified: Secondary | ICD-10-CM | POA: Diagnosis not present

## 2019-06-21 DIAGNOSIS — R269 Unspecified abnormalities of gait and mobility: Secondary | ICD-10-CM | POA: Diagnosis not present

## 2019-06-21 DIAGNOSIS — L89322 Pressure ulcer of left buttock, stage 2: Secondary | ICD-10-CM | POA: Diagnosis not present

## 2019-06-21 DIAGNOSIS — E1140 Type 2 diabetes mellitus with diabetic neuropathy, unspecified: Secondary | ICD-10-CM | POA: Diagnosis not present

## 2019-06-21 DIAGNOSIS — E538 Deficiency of other specified B group vitamins: Secondary | ICD-10-CM | POA: Diagnosis not present

## 2019-06-21 DIAGNOSIS — E1122 Type 2 diabetes mellitus with diabetic chronic kidney disease: Secondary | ICD-10-CM | POA: Diagnosis not present

## 2019-06-21 DIAGNOSIS — E559 Vitamin D deficiency, unspecified: Secondary | ICD-10-CM | POA: Diagnosis not present

## 2019-06-21 DIAGNOSIS — I129 Hypertensive chronic kidney disease with stage 1 through stage 4 chronic kidney disease, or unspecified chronic kidney disease: Secondary | ICD-10-CM | POA: Diagnosis not present

## 2019-06-22 ENCOUNTER — Telehealth: Payer: Self-pay | Admitting: Internal Medicine

## 2019-06-22 DIAGNOSIS — E669 Obesity, unspecified: Secondary | ICD-10-CM | POA: Diagnosis not present

## 2019-06-22 DIAGNOSIS — L89322 Pressure ulcer of left buttock, stage 2: Secondary | ICD-10-CM | POA: Diagnosis not present

## 2019-06-22 DIAGNOSIS — R627 Adult failure to thrive: Secondary | ICD-10-CM | POA: Diagnosis not present

## 2019-06-22 DIAGNOSIS — N1832 Chronic kidney disease, stage 3b: Secondary | ICD-10-CM | POA: Diagnosis not present

## 2019-06-22 DIAGNOSIS — E538 Deficiency of other specified B group vitamins: Secondary | ICD-10-CM | POA: Diagnosis not present

## 2019-06-22 DIAGNOSIS — E1122 Type 2 diabetes mellitus with diabetic chronic kidney disease: Secondary | ICD-10-CM | POA: Diagnosis not present

## 2019-06-22 DIAGNOSIS — M549 Dorsalgia, unspecified: Secondary | ICD-10-CM | POA: Diagnosis not present

## 2019-06-22 DIAGNOSIS — E559 Vitamin D deficiency, unspecified: Secondary | ICD-10-CM | POA: Diagnosis not present

## 2019-06-22 DIAGNOSIS — I129 Hypertensive chronic kidney disease with stage 1 through stage 4 chronic kidney disease, or unspecified chronic kidney disease: Secondary | ICD-10-CM | POA: Diagnosis not present

## 2019-06-22 NOTE — Telephone Encounter (Signed)
Copied from [place] [id number] [id number]. Topic: General - Other >> Jun 22, 2019  3:12 PM Leward Quan A wrote: Reason for [id number] daughter Timothy Lasso called to inquire about orders that were requested earlier this week for a Hospital bed air mattress, [employer]. States that per OT they cannot work with patient properly because they do not have the proper equipment. Please call with questions. Ph# [phone]

## 2019-06-25 DIAGNOSIS — L89322 Pressure ulcer of left buttock, stage 2: Secondary | ICD-10-CM | POA: Diagnosis not present

## 2019-06-25 DIAGNOSIS — M549 Dorsalgia, unspecified: Secondary | ICD-10-CM | POA: Diagnosis not present

## 2019-06-25 DIAGNOSIS — E669 Obesity, unspecified: Secondary | ICD-10-CM | POA: Diagnosis not present

## 2019-06-25 DIAGNOSIS — E559 Vitamin D deficiency, unspecified: Secondary | ICD-10-CM | POA: Diagnosis not present

## 2019-06-25 DIAGNOSIS — R627 Adult failure to thrive: Secondary | ICD-10-CM | POA: Diagnosis not present

## 2019-06-25 DIAGNOSIS — I129 Hypertensive chronic kidney disease with stage 1 through stage 4 chronic kidney disease, or unspecified chronic kidney disease: Secondary | ICD-10-CM | POA: Diagnosis not present

## 2019-06-25 DIAGNOSIS — E1122 Type 2 diabetes mellitus with diabetic chronic kidney disease: Secondary | ICD-10-CM | POA: Diagnosis not present

## 2019-06-25 DIAGNOSIS — N1832 Chronic kidney disease, stage 3b: Secondary | ICD-10-CM | POA: Diagnosis not present

## 2019-06-25 DIAGNOSIS — E538 Deficiency of other specified B group vitamins: Secondary | ICD-10-CM | POA: Diagnosis not present

## 2019-06-26 ENCOUNTER — Telehealth: Payer: Self-pay | Admitting: Internal Medicine

## 2019-06-26 DIAGNOSIS — E538 Deficiency of other specified B group vitamins: Secondary | ICD-10-CM | POA: Diagnosis not present

## 2019-06-26 DIAGNOSIS — E1122 Type 2 diabetes mellitus with diabetic chronic kidney disease: Secondary | ICD-10-CM | POA: Diagnosis not present

## 2019-06-26 DIAGNOSIS — E559 Vitamin D deficiency, unspecified: Secondary | ICD-10-CM | POA: Diagnosis not present

## 2019-06-26 DIAGNOSIS — M549 Dorsalgia, unspecified: Secondary | ICD-10-CM | POA: Diagnosis not present

## 2019-06-26 DIAGNOSIS — E669 Obesity, unspecified: Secondary | ICD-10-CM | POA: Diagnosis not present

## 2019-06-26 DIAGNOSIS — R627 Adult failure to thrive: Secondary | ICD-10-CM | POA: Diagnosis not present

## 2019-06-26 DIAGNOSIS — I129 Hypertensive chronic kidney disease with stage 1 through stage 4 chronic kidney disease, or unspecified chronic kidney disease: Secondary | ICD-10-CM | POA: Diagnosis not present

## 2019-06-26 DIAGNOSIS — N1832 Chronic kidney disease, stage 3b: Secondary | ICD-10-CM | POA: Diagnosis not present

## 2019-06-26 DIAGNOSIS — L89322 Pressure ulcer of left buttock, stage 2: Secondary | ICD-10-CM | POA: Diagnosis not present

## 2019-06-26 NOTE — Telephone Encounter (Signed)
Netha from [facility] called  She is following up on form that was faxed in.  To see if it is signed  cb is [phone]

## 2019-06-28 ENCOUNTER — Encounter (HOSPITAL_BASED_OUTPATIENT_CLINIC_OR_DEPARTMENT_OTHER): Payer: Medicare HMO | Attending: Internal Medicine | Admitting: Internal Medicine

## 2019-06-28 ENCOUNTER — Other Ambulatory Visit: Payer: Self-pay

## 2019-06-28 DIAGNOSIS — F329 Major depressive disorder, single episode, unspecified: Secondary | ICD-10-CM | POA: Insufficient documentation

## 2019-06-28 DIAGNOSIS — Z8249 Family history of ischemic heart disease and other diseases of the circulatory system: Secondary | ICD-10-CM | POA: Insufficient documentation

## 2019-06-28 DIAGNOSIS — Z823 Family history of stroke: Secondary | ICD-10-CM | POA: Insufficient documentation

## 2019-06-28 DIAGNOSIS — I129 Hypertensive chronic kidney disease with stage 1 through stage 4 chronic kidney disease, or unspecified chronic kidney disease: Secondary | ICD-10-CM | POA: Diagnosis not present

## 2019-06-28 DIAGNOSIS — Z880 Allergy status to penicillin: Secondary | ICD-10-CM | POA: Diagnosis not present

## 2019-06-28 DIAGNOSIS — Z888 Allergy status to other drugs, medicaments and biological substances status: Secondary | ICD-10-CM | POA: Insufficient documentation

## 2019-06-28 DIAGNOSIS — H5461 Unqualified visual loss, right eye, normal vision left eye: Secondary | ICD-10-CM | POA: Insufficient documentation

## 2019-06-28 DIAGNOSIS — Z833 Family history of diabetes mellitus: Secondary | ICD-10-CM | POA: Diagnosis not present

## 2019-06-28 DIAGNOSIS — E11622 Type 2 diabetes mellitus with other skin ulcer: Secondary | ICD-10-CM | POA: Insufficient documentation

## 2019-06-28 DIAGNOSIS — E1122 Type 2 diabetes mellitus with diabetic chronic kidney disease: Secondary | ICD-10-CM | POA: Insufficient documentation

## 2019-06-28 DIAGNOSIS — K2970 Gastritis, unspecified, without bleeding: Secondary | ICD-10-CM | POA: Insufficient documentation

## 2019-06-28 DIAGNOSIS — E1142 Type 2 diabetes mellitus with diabetic polyneuropathy: Secondary | ICD-10-CM | POA: Insufficient documentation

## 2019-06-28 DIAGNOSIS — M1990 Unspecified osteoarthritis, unspecified site: Secondary | ICD-10-CM | POA: Diagnosis not present

## 2019-06-28 DIAGNOSIS — L89150 Pressure ulcer of sacral region, unstageable: Secondary | ICD-10-CM | POA: Insufficient documentation

## 2019-06-28 DIAGNOSIS — K219 Gastro-esophageal reflux disease without esophagitis: Secondary | ICD-10-CM | POA: Diagnosis not present

## 2019-06-28 DIAGNOSIS — N183 Chronic kidney disease, stage 3 unspecified: Secondary | ICD-10-CM | POA: Diagnosis not present

## 2019-06-29 DIAGNOSIS — E1122 Type 2 diabetes mellitus with diabetic chronic kidney disease: Secondary | ICD-10-CM | POA: Diagnosis not present

## 2019-06-29 DIAGNOSIS — L89322 Pressure ulcer of left buttock, stage 2: Secondary | ICD-10-CM | POA: Diagnosis not present

## 2019-06-29 DIAGNOSIS — I129 Hypertensive chronic kidney disease with stage 1 through stage 4 chronic kidney disease, or unspecified chronic kidney disease: Secondary | ICD-10-CM | POA: Diagnosis not present

## 2019-06-29 DIAGNOSIS — E669 Obesity, unspecified: Secondary | ICD-10-CM | POA: Diagnosis not present

## 2019-06-29 DIAGNOSIS — N1832 Chronic kidney disease, stage 3b: Secondary | ICD-10-CM | POA: Diagnosis not present

## 2019-06-29 DIAGNOSIS — E559 Vitamin D deficiency, unspecified: Secondary | ICD-10-CM | POA: Diagnosis not present

## 2019-06-29 DIAGNOSIS — M549 Dorsalgia, unspecified: Secondary | ICD-10-CM | POA: Diagnosis not present

## 2019-06-29 DIAGNOSIS — R627 Adult failure to thrive: Secondary | ICD-10-CM | POA: Diagnosis not present

## 2019-06-29 DIAGNOSIS — E538 Deficiency of other specified B group vitamins: Secondary | ICD-10-CM | POA: Diagnosis not present

## 2019-06-29 NOTE — Telephone Encounter (Signed)
They are refaxing form

## 2019-07-02 DIAGNOSIS — R627 Adult failure to thrive: Secondary | ICD-10-CM | POA: Diagnosis not present

## 2019-07-02 DIAGNOSIS — E669 Obesity, unspecified: Secondary | ICD-10-CM | POA: Diagnosis not present

## 2019-07-02 DIAGNOSIS — E559 Vitamin D deficiency, unspecified: Secondary | ICD-10-CM | POA: Diagnosis not present

## 2019-07-02 DIAGNOSIS — L89322 Pressure ulcer of left buttock, stage 2: Secondary | ICD-10-CM | POA: Diagnosis not present

## 2019-07-02 DIAGNOSIS — E538 Deficiency of other specified B group vitamins: Secondary | ICD-10-CM | POA: Diagnosis not present

## 2019-07-02 DIAGNOSIS — I129 Hypertensive chronic kidney disease with stage 1 through stage 4 chronic kidney disease, or unspecified chronic kidney disease: Secondary | ICD-10-CM | POA: Diagnosis not present

## 2019-07-02 DIAGNOSIS — E1122 Type 2 diabetes mellitus with diabetic chronic kidney disease: Secondary | ICD-10-CM | POA: Diagnosis not present

## 2019-07-02 DIAGNOSIS — N1832 Chronic kidney disease, stage 3b: Secondary | ICD-10-CM | POA: Diagnosis not present

## 2019-07-02 DIAGNOSIS — M549 Dorsalgia, unspecified: Secondary | ICD-10-CM | POA: Diagnosis not present

## 2019-07-02 NOTE — Progress Notes (Signed)
Holly Banks, Holly Banks ([id number]) Visit Report for 06/28/2019 Abuse/Suicide Risk Screen Details Patient Name: Date of Service: Holly Banks, Holly Banks 06/28/2019 1:15 PM Medical Record [id number] Patient Account Number: [id number] Date of Birth/Sex: Treating RN: March 22, 1929 (86 y.o. Female) Levan Hurst Primary Care Brenisha Tsui: Cassandria Anger Other Clinician: Referring Tennis Mckinnon: Treating Law Corsino/Extender:Robson, Letitia Neri, Vivi Ferns in Treatment: 0 Abuse/Suicide Risk Screen Items Answer ABUSE RISK SCREEN: Has anyone close to you tried to hurt or harm you recentlyo No Do you feel uncomfortable with anyone in your familyo No Has anyone forced you do things that you didnt want to doo No Electronic Signature(s) Signed: 07/02/2019 6:46:58 PM By: Levan Hurst RN, BSN Entered By: Levan Hurst on 06/28/2019 14:17:51 -------------------------------------------------------------------------------- Activities of Daily Living Details Patient Name: Date of Service: Holly Banks, Holly Banks 06/28/2019 1:15 PM Medical Record [id number] Patient Account Number: [id number] Date of Birth/Sex: Treating RN: 1929/02/01 (86 y.o. Female) Levan Hurst Primary Care Frankye Schwegel: Cassandria Anger Other Clinician: Referring Marrion Accomando: Treating Saydee Zolman/Extender:Robson, Letitia Neri, Vivi Ferns in Treatment: 0 Activities of Daily Living Items Answer Activities of Daily Living (Please select one for each item) Drive Automobile Not Able Take Medications Completely Able Use Telephone Completely Able Care for Appearance Need Assistance Use Toilet Need Assistance Bath / Shower Need Assistance Dress Self Need Assistance Feed Self Need Assistance Walk Need Assistance Get In / Out Bed Need Assistance Housework Need Assistance Prepare Meals Need Assistance Handle Money Need Assistance Shop for Self Need Assistance Electronic Signature(s) Signed: 07/02/2019 6:46:58 PM By: Levan Hurst RN, BSN Entered By: Levan Hurst on 06/28/2019 14:18:36 -------------------------------------------------------------------------------- Education Screening Details Patient Name: Date of Service: Holly Banks, Holly Banks 06/28/2019 1:15 PM Medical Record [id number] Patient Account Number: [id number] Date of Birth/Sex: Treating RN: 1928/12/13 (86 y.o. Female) Levan Hurst Primary Care Daxten Kovalenko: Cassandria Anger Other Clinician: Referring Eagan Shifflett: Treating Rigo Letts/Extender:Robson, Letitia Neri, Vivi Ferns in Treatment: 0 Primary Learner Assessed: Patient Learning Preferences/Education Level/Primary Language Learning Preference: Explanation, Demonstration, Printed Material Highest Education Level: High School Preferred Language: English Cognitive Barrier Language Barrier: No Translator Needed: No Memory Deficit: No Emotional Barrier: No Cultural/Religious Beliefs Affecting Medical Care: No Physical Barrier Impaired Vision: No Impaired Hearing: No Decreased Hand dexterity: No Knowledge/Comprehension Knowledge Level: High Comprehension Level: High Ability to understand written High instructions: Ability to understand verbal High instructions: Motivation Anxiety Level: Calm Cooperation: Cooperative Education Importance: Acknowledges Need Interest in Health Problems: Asks Questions Perception: Coherent Willingness to Engage in Self- High Management Activities: Readiness to Engage in Self- High Management Activities: Electronic Signature(s) Signed: 07/02/2019 6:46:58 PM By: Levan Hurst RN, BSN Entered By: Levan Hurst on 06/28/2019 14:19:07 -------------------------------------------------------------------------------- Fall Risk Assessment Details Patient Name: Date of Service: Holly Banks, Holly Banks 06/28/2019 1:15 PM Medical Record [id number] Patient Account Number: [id number] Date of Birth/Sex: Treating RN: [birth year] (86 y.o.  Female) Levan Hurst Primary Care Shavontae Gibeault: Cassandria Anger Other Clinician: Referring Deniya Craigo: Treating Kenson Groh/Extender:Robson, Letitia Neri, Vivi Ferns in Treatment: 0 Fall Risk Assessment Items Have you had 2 or more falls in the last 12 monthso 0 No Have you had any fall that resulted in injury in the last 12 monthso 0 No FALLS RISK SCREEN History of falling - immediate or within 3 months 25 Yes Secondary diagnosis (Do you have 2 or more medical diagnoseso) 15 Yes Ambulatory aid None/bed rest/wheelchair/nurse 0 Yes Crutches/cane/walker 0 No Furniture 0 No Intravenous therapy Access/Saline/Heparin Lock 0 No Weak (short steps with or without shuffle, stooped but able to lift head 0 No while walking, may seek support from  furniture) Impaired (short steps with shuffle, may have difficulty arising from chair, 20 Yes head down, impaired balance) Mental Status Oriented to own ability 0 Yes Overestimates or forgets limitations 0 No Risk Level: High Risk Score: 60 Electronic Signature(s) Signed: 07/02/2019 6:46:58 PM By: Levan Hurst RN, BSN Entered By: Levan Hurst on 06/28/2019 14:20:00 -------------------------------------------------------------------------------- Nutrition Risk Screening Details Patient Name: Date of Service: Holly Banks, Holly Banks 06/28/2019 1:15 PM Medical Record [id number] Patient Account Number: [id number] Date of Birth/Sex: Treating RN: 06/27/1928 (86 y.o. Female) Levan Hurst Primary Care [facility]: Cassandria Anger Other Clinician: Referring Dontrell Stuck: Treating Ryan Ogborn/Extender:Robson, Letitia Neri, Vivi Ferns in Treatment: 0 Height (in): 62 Weight (lbs): 210 Body Mass Index (BMI): 38.4 Nutrition Risk Screening Items Score Screening NUTRITION RISK SCREEN: I have an illness or condition that made me change the kind and/or 0 No amount of food I eat I eat fewer than two meals per day 0 No I eat few fruits and  vegetables, or milk products 0 No I have three or more drinks of beer, liquor or wine almost every day 0 No I have tooth or mouth problems that make it hard for me to eat 0 No I don't always have enough money to buy the food I need 0 No I eat alone most of the time 0 No I take three or more different prescribed or over-the-counter drugs a day 1 Yes 0 No Without wanting to, I have lost or gained 10 pounds in the last six months I am not always physically able to shop, cook and/or feed myself 2 Yes Nutrition Protocols Good Risk Protocol Provide education on Moderate Risk Protocol 0 nutrition High Risk Proctocol Risk Level: Moderate Risk Score: 3 Electronic Signature(s) Signed: 07/02/2019 6:46:58 PM By: Levan Hurst RN, BSN Entered By: Levan Hurst on 06/28/2019 14:20:09

## 2019-07-02 NOTE — Progress Notes (Addendum)
Holly Banks, Holly Banks ([id number]) Visit Report for 06/28/2019 Allergy List Details Patient Name: Date of Service: Holly Banks, Holly Banks 06/28/2019 1:15 PM Medical Record [id number] Patient Account Number: [id number] Date of Birth/Sex: Treating RN: [birth year] (86 y.o. Female) Levan Hurst Primary Care Enmanuel Zufall: Cassandria Anger Other Clinician: Referring Dmani Mizer: Treating Chermaine Schnyder/Extender:Robson, Letitia Neri, Vivi Ferns in Treatment: 0 Allergies Active Allergies penicillin Reaction: rash Severity: Moderate Ditropan Reaction: dry mouth Severity: Moderate Allergy Notes Electronic Signature(s) Signed: 07/02/2019 6:46:58 PM By: Levan Hurst RN, BSN Entered By: Levan Hurst on 06/28/2019 14:06:09 -------------------------------------------------------------------------------- Arrival Information Details Patient Name: Date of Service: Holly Banks, Holly Banks 06/28/2019 1:15 PM Medical Record [id number] Patient Account Number: [id number] Date of Birth/Sex: Treating RN: [birth year] (86 y.o. Female) Levan Hurst Primary Care Shanaiya Bene: Cassandria Anger Other Clinician: Referring Jimel Myler: Treating Svea Pusch/Extender:Robson, Letitia Neri, Vivi Ferns in Treatment: 0 Visit Information Patient Arrived: Wheel Chair Arrival Time: 14:02 Accompanied By: daughter Transfer Assistance: Manual Patient Identification Verified: Yes Secondary Verification Process Completed: Yes Patient Requires Transmission-Based No Precautions: Patient Has Alerts: No Electronic Signature(s) Signed: 07/02/2019 6:46:58 PM By: Levan Hurst RN, BSN Entered By: Levan Hurst on 06/28/2019 14:02:48 -------------------------------------------------------------------------------- Clinic Level of Care Assessment Details Patient Name: Date of Service: Holly Banks, Holly Banks 06/28/2019 1:15 PM Medical Record [id number] Patient Account Number: [id number] Date of  Birth/Sex: Treating RN: Sep 09, 1928 (86 y.o. Female) Deon Pilling Primary Care Reia Viernes: Cassandria Anger Other Clinician: Referring Josuha Fontanez: Treating Esmond Hinch/Extender:Robson, Letitia Neri, Vivi Ferns in Treatment: 0 Clinic Level of Care Assessment Items TOOL 1 Quantity Score X - Use when EandM and Procedure is performed on INITIAL visit 1 0 ASSESSMENTS - Nursing Assessment / Reassessment X - General Physical Exam (combine w/ comprehensive assessment (listed just below) 1 20 when performed on new pt. evals) X - Comprehensive Assessment (HX, ROS, Risk Assessments, Wounds Hx, etc.) 1 25 ASSESSMENTS - Wound and Skin Assessment / Reassessment X - Dermatologic / Skin Assessment (not related to wound area) 1 10 ASSESSMENTS - Ostomy and/or Continence Assessment and Care []  - Incontinence Assessment and Management 0 []  - Ostomy Care Assessment and Management (repouching, etc.) 0 PROCESS - Coordination of Care X - Simple Patient / Family Education for ongoing care 1 15 []  - Complex (extensive) Patient / Family Education for ongoing care 0 X - Staff obtains Programmer, systems, Records, Test Results / Process Orders 1 10 X - Staff telephones HHA, Nursing Homes / Clarify orders / etc 1 10 []  - Routine Transfer to another Facility (non-emergent condition) 0 []  - Routine Hospital Admission (non-emergent condition) 0 X - New Admissions / Biomedical engineer / Ordering NPWT, Apligraf, etc. 1 15 []  - Emergency Hospital Admission (emergent condition) 0 PROCESS - Special Needs []  - Pediatric / Minor Patient Management 0 []  - Isolation Patient Management 0 []  - Hearing / Language / Visual special needs 0 []  - Assessment of Community assistance (transportation, D/C planning, etc.) 0 []  - Additional assistance / Altered mentation 0 []  - Support Surface(s) Assessment (bed, cushion, seat, etc.) 0 INTERVENTIONS - Miscellaneous []  - External ear exam 0 []  - Patient Transfer (multiple staff /  Civil Service fast streamer / Similar devices) 0 []  - Simple Staple / Suture removal (25 or less) 0 []  - Complex Staple / Suture removal (26 or more) 0 []  - Hypo/Hyperglycemic Management (do not check if billed separately) 0 []  - Ankle / Brachial Index (ABI) - do not check if billed separately 0 Has the patient been seen at the hospital within the last three years: Yes Total Score:  105 Level Of Care: New/Established - Level 3 Electronic Signature(s) Signed: 06/28/2019 6:20:01 PM By: Deon Pilling Entered By: Deon Pilling on 06/28/2019 15:23:15 -------------------------------------------------------------------------------- Multi Wound Chart Details Patient Name: Date of Service: Holly Banks, Holly Banks 06/28/2019 1:15 PM Medical Record [id number] Patient Account Number: [id number] Date of Birth/Sex: Treating RN: [birth year] (86 y.o. Female) Primary Care Jaquail Mclees: Cassandria Anger Other Clinician: Referring Kirklin Mcduffee: Treating Landis Dowdy/Extender:Robson, Letitia Neri, Vivi Ferns in Treatment: 0 Vital Signs Height(in): 62 Pulse(bpm): 87 Weight(lbs): 210 Blood Pressure(mmHg): 150/67 Body Mass Index(BMI): 38 Temperature(F): 98.6 Respiratory 18 Rate(breaths/min): Photos: [1:No Photos] [N/A:N/A] Wound Location: [1:Sacrum] [N/A:N/A] Wounding Event: [1:Pressure Injury] [N/A:N/A] Primary Etiology: [1:Pressure Ulcer] [N/A:N/A] Comorbid History: [1:Hypertension, Type II Diabetes, Gout, Osteoarthritis, Neuropathy] [N/A:N/A] Date Acquired: [1:05/08/2019] [N/A:N/A] Weeks of Treatment: [1:0] [N/A:N/A] Wound Status: [1:Open] [N/A:N/A] Measurements L x W x D 2.5x3x2.6 [N/A:N/A] (cm) Area (cm) : [1:5.89] [N/A:N/A] Volume (cm) : [1:15.315] [N/A:N/A] % Reduction in Area: [1:0.00%] [N/A:N/A] % Reduction in Volume: 0.00% [N/A:N/A] Classification: [1:Unstageable/Unclassified N/A] Exudate Amount: [1:Medium] [N/A:N/A] Exudate Type: [1:Serosanguineous] [N/A:N/A] Exudate Color: [1:red, brown]  [N/A:N/A] Wound Margin: [1:Well defined, not attached N/A] Granulation Amount: [1:Small (1-33%)] [N/A:N/A] Granulation Quality: [1:Red, Pink] [N/A:N/A] Necrotic Amount: [1:Large (67-100%)] [N/A:N/A] Exposed Structures: [1:Fat Layer (Subcutaneous N/A Tissue) Exposed: Yes Fascia: No Tendon: No Muscle: No Joint: No Bone: No] Epithelialization: [1:None] [N/A:N/A] Debridement: [1:Chemical/Enzymatic/Mechanical] [N/A:N/A] Pre-procedure [1:15:23] [N/A:N/A] Verification/Time Out Taken: Pain Control: [1:Lidocaine 4% Topical Solution] [N/A:N/A] Instrument: [1:N/A] [N/A:N/A] Bleeding: [1:None] [N/A:N/A] Procedural Pain: [1:0] [N/A:N/A] Post Procedural Pain: [1:0] [N/A:N/A] Debridement Treatment Procedure was tolerated [N/A:N/A] Response: [1:well] Post Debridement [1:2.5x3x2.6] [N/A:N/A] Measurements L x W x D (cm) Post Debridement [1:15.315] [N/A:N/A] Volume: (cm) Post Debridement Stage: Unstageable/Unclassified N/A [N/A:N/A] Treatment Notes Electronic Signature(s) Signed: 06/28/2019 5:54:58 PM By: Linton Ham MD Entered By: Linton Ham on 06/28/2019 15:31:54 -------------------------------------------------------------------------------- Multi-Disciplinary Care Plan Details Patient Name: Date of Service: Holly Banks, Holly Banks 06/28/2019 1:15 PM Medical Record [id number] Patient Account Number: [id number] Date of Birth/Sex: Treating RN: Oct 15, 1928 (86 y.o. Female) Deon Pilling Primary Care Senay Sistrunk: Cassandria Anger Other Clinician: Referring Kamoni Gentles: Treating Nila Winker/Extender:Robson, Letitia Neri, Vivi Ferns in Treatment: 0 Active Inactive Abuse / Safety / Falls / Self Care Management Nursing Diagnoses: Potential for falls Goals: Patient will remain injury free related to falls Date Initiated: 06/28/2019 Target Resolution Date: 07/13/2019 Goal Status: Active Interventions: Provide education on fall prevention Notes: Nutrition Nursing  Diagnoses: Potential for alteratiion in Nutrition/Potential for imbalanced nutrition Goals: Patient/caregiver agrees to and verbalizes understanding of need to obtain nutritional consultation Date Initiated: 06/28/2019 Target Resolution Date: 07/13/2019 Goal Status: Active Patient/caregiver verbalizes understanding of need to maintain therapeutic glucose control per primary care physician Date Initiated: 06/28/2019 Target Resolution Date: 07/13/2019 Goal Status: Active Interventions: Provide education on elevated blood sugars and impact on wound healing Provide education on nutrition Treatment Activities: Patient referred to Primary Care Physician for further nutritional evaluation : 06/28/2019 Notes: Orientation to the Wound Care Program Nursing Diagnoses: Knowledge deficit related to the wound healing center program Goals: Patient/caregiver will verbalize understanding of the [place] Date Initiated: 06/28/2019 Target Resolution Date: 07/13/2019 Goal Status: Active Interventions: Provide education on orientation to the wound center Notes: Pressure Nursing Diagnoses: Potential for impaired tissue integrity related to pressure, friction, moisture, and shear Goals: Patient/caregiver will verbalize understanding of pressure ulcer management Date Initiated: 06/28/2019 Target Resolution Date: 07/13/2019 Goal Status: Active Interventions: Assess potential for pressure ulcer upon admission and as needed Provide education on pressure ulcers Notes: Wound/Skin Impairment Nursing Diagnoses: Knowledge deficit related  to ulceration/compromised skin integrity Goals: Patient/caregiver will verbalize understanding of skin care regimen Date Initiated: 06/28/2019 Target Resolution Date: 07/13/2019 Goal Status: Active Interventions: Assess ulceration(s) every visit Provide education on ulcer and skin care Treatment Activities: Skin care regimen initiated : 06/28/2019 Topical  wound management initiated : 06/28/2019 Notes: Electronic Signature(s) Signed: 06/28/2019 6:20:01 PM By: Deon Pilling Entered By: Deon Pilling on 06/28/2019 15:22:11 -------------------------------------------------------------------------------- Pain Assessment Details Patient Name: Date of Service: Holly Banks, Holly Banks 06/28/2019 1:15 PM Medical Record [id number] Patient Account Number: [id number] Date of Birth/Sex: Treating RN: [birth year] (86 y.o. Female) Levan Hurst Primary Care Easter Schinke: Cassandria Anger Other Clinician: Referring Vonnie Ligman: Treating Roque Schill/Extender:Robson, Letitia Neri, Vivi Ferns in Treatment: 0 Active Problems Location of Pain Severity and Description of Pain Patient Has Paino No Site Locations Pain Management and Medication Current Pain Management: Electronic Signature(s) Signed: 07/02/2019 6:46:58 PM By: Levan Hurst RN, BSN Entered By: Levan Hurst on 06/28/2019 14:21:36 -------------------------------------------------------------------------------- Patient/Caregiver Education Details Patient Name: Holly Banks 1/21/2021andnbsp1:15 Date of Service: PM Medical Record [id number] Number: Patient Account Number: [id number] Treating RN: 1928-06-15 (86 y.o. Deon Pilling Date of Birth/Gender: Female) Other Clinician: Primary Care Treating Plotnikov, Donney Rankins Physician: Physician/Extender: Referring Physician: Charlann Noss in Treatment: 0 Education Assessment Education Provided To: Patient Education Topics Provided Nutrition: Handouts: Nutrition Methods: Explain/Verbal, Printed Responses: Reinforcements needed Welcome To [facility]: Handouts: Welcome To [facility] Methods: Explain/Verbal, Printed Responses: Reinforcements needed Wound/Skin Impairment: Handouts: Caring for Your Ulcer, Skin Care Do's and Dont's Methods: Explain/Verbal, Printed Responses:  Reinforcements needed Electronic Signature(s) Signed: 06/28/2019 6:20:01 PM By: Deon Pilling Entered By: Deon Pilling on 06/28/2019 15:22:42 -------------------------------------------------------------------------------- Wound Assessment Details Patient Name: Date of Service: Holly Banks, Holly Banks 06/28/2019 1:15 PM Medical Record [id number] Patient Account Number: [id number] Date of Birth/Sex: Treating RN: 17-Aug-1928 (86 y.o. Female) Primary Care Doretta Remmert: Cassandria Anger Other Clinician: Referring Cris Talavera: Treating Majd Tissue/Extender:Robson, Letitia Neri, Vivi Ferns in Treatment: 0 Wound Status Wound Number: 1 Primary Pressure Ulcer Etiology: Wound Location: Sacrum Wound Open Wounding Event: Pressure Injury Status: Date Acquired: 05/08/2019 Comorbid Hypertension, Type II Diabetes, Gout, Weeks Of Treatment: 0 History: Osteoarthritis, Neuropathy Clustered Wound: No Photos Wound Measurements Length: (cm) 2.5 % Reducti Width: (cm) 3 % Reducti Depth: (cm) 2.6 Epithelia Area: (cm) 5.89 Tunnelin Volume: (cm) 15.315 Undermin Wound Description Classification: Unstageable/Unclassified Wound Margin: Well defined, not attached Exudate Amount: Medium Exudate Type: Serosanguineous Exudate Color: red, brown Wound Bed Granulation Amount: Small (1-33%) Granulation Quality: Red, Pink Necrotic Amount: Large (67-100%) Necrotic Quality: Adherent Slough Foul Odor After Cleansing: No Slough/Fibrino Yes Exposed Structure Fascia Exposed: No Fat Layer (Subcutaneous Tissue) Exposed: Yes Tendon Exposed: No Muscle Exposed: No Joint Exposed: No Bone Exposed: No on in Area: 0% on in Volume: 0% lization: None g: No ing: No Electronic Signature(s) Signed: 07/03/2019 5:09:30 PM By: Mikeal Hawthorne EMT/HBOT Previous Signature: 06/28/2019 6:20:01 PM Version By: Deon Pilling Previous Signature: 07/02/2019 6:46:58 PM Version By: Levan Hurst RN, BSN Entered By: Mikeal Hawthorne on 07/03/2019 11:36:19 -------------------------------------------------------------------------------- [place] Details Patient Name: Date of Service: Holly Banks, Holly Banks 06/28/2019 1:15 PM Medical Record [id number] Patient Account Number: [id number] Date of Birth/Sex: Treating RN: [birth year] (86 y.o. Female) Levan Hurst Primary Care Tylan Kinn: Cassandria Anger Other Clinician: Referring Jerlene Rockers: Treating Makahla Kiser/Extender:Robson, Letitia Neri, Vivi Ferns in Treatment: 0 Vital Signs Time Taken: 14:02 Temperature (F): 98.6 Height (in): 62 Pulse (bpm): 87 Source: Stated Respiratory Rate (breaths/min): 18 Weight (lbs): 210 Blood Pressure (mmHg): 150/67 Source: Stated Reference Range:  80 - 120 mg / dl Body Mass Index (BMI): 38.4 Electronic Signature(s) Signed: 07/02/2019 6:46:58 PM By: Levan Hurst RN, BSN Entered By: Levan Hurst on 06/28/2019 14:03:53

## 2019-07-02 NOTE — Progress Notes (Signed)
BARBARAJEAN, GOULART ([id number]) Visit Report for 06/28/2019 Chief Complaint Document Details Patient Name: Date of Service: Holly Banks, Holly Banks 06/28/2019 1:15 PM Medical Record [id number] Patient Account Number: [id number] Date of Birth/Sex: Treating RN: 10-27-1928 (86 y.o. Female) Primary Care Provider: Cassandria Anger Other Clinician: Referring Provider: Treating Provider/Extender:Angele Wiemann, Letitia Neri, Vivi Ferns in Treatment: 0 Information Obtained from: Patient Chief Complaint 06/28/2019; patient is here for review of a pressure ulcer on the lower sacrum Electronic Signature(s) Signed: 06/28/2019 5:54:58 PM By: Linton Ham MD Entered By: Linton Ham on 06/28/2019 15:32:51 -------------------------------------------------------------------------------- Debridement Details Patient Name: Date of Service: Holly Banks, Holly Banks 06/28/2019 1:15 PM Medical Record [id number] Patient Account Number: [id number] Date of Birth/Sex: 1929/01/16 (86 y.o. Female) Treating RN: Deon Pilling Primary Care Provider: Cassandria Anger Other Clinician: Referring Provider: Treating Provider/Extender:Kristalynn Coddington, Letitia Neri, Vivi Ferns in Treatment: 0 Debridement Performed for Wound #1 Sacrum Assessment: Performed By: Clinician Baruch Gouty, RN Debridement Type: Chemical/Enzymatic/Mechanical Agent Used: Santyl Level of Consciousness (Pre- Awake and Alert procedure): Pre-procedure Verification/Time Out Taken: Yes - 15:23 Start Time: 15:24 Pain Control: Lidocaine 4% Topical Solution Bleeding: None End Time: 15:25 Procedural Pain: 0 Post Procedural Pain: 0 Response to Treatment: Procedure was tolerated well Level of Consciousness Awake and Alert (Post-procedure): Post Debridement Measurements of Total Wound Length: (cm) 2.5 Stage: Unstageable/Unclassified Width: (cm) 3 Depth: (cm) 2.6 Volume: (cm) 15.315 Character of Wound/Ulcer  Post Improved Debridement: Post Procedure Diagnosis Same as Pre-procedure Electronic Signature(s) Signed: 06/28/2019 5:54:58 PM By: Linton Ham MD Signed: 06/28/2019 6:20:01 PM By: Deon Pilling Entered By: Deon Pilling on 06/28/2019 15:24:25 -------------------------------------------------------------------------------- HPI Details Patient Name: Date of Service: Holly, Banks 06/28/2019 1:15 PM Medical Record [id number] Patient Account Number: [id number] Date of Birth/Sex: Treating RN: [birth year] (86 y.o. Female) Primary Care Provider: Cassandria Anger Other Clinician: Referring Provider: Treating Provider/Extender:Natika Geyer, Letitia Neri, Vivi Ferns in Treatment: 0 History of Present Illness HPI Description: Admission 06/28/2019 This is a frail 86 year old woman who arrives with a 1-1/2 to 22-month history with an area on her lower sacrum. She lives at home relatively immobile secondary to diabetic neuropathy, osteoarthritis and gait and balance issues. She was seen by telemetry health however by her primary doctor in October and was noted to have a left buttock stage II. They were applying Tegaderm to this for a while but that did not help she is now just using dry gauze after cleaning the wound. She apparently is not eating well finds her taste declining. Has a hospital bed but not in over lying mattress which she certainly needs. I am not clear what she has on her wheelchair Past medical history; type 2 diabetes with peripheral neuropathy but a last hemoglobin A1c of 6.7 on 11/30, gout, hypertension, weakness, gait decline, osteoarthritis, blind in the right eye. Notable to have very poor oral intake Electronic Signature(s) Signed: 06/28/2019 5:54:58 PM By: Linton Ham MD Entered By: Linton Ham on 06/28/2019 15:34:47 -------------------------------------------------------------------------------- Physical Exam Details Patient Name: Date of  Service: Holly Banks, Holly Banks 06/28/2019 1:15 PM Medical Record [id number] Patient Account Number: [id number] Date of Birth/Sex: Treating RN: 07-Mar-1929 (86 y.o. Female) Primary Care Provider: Cassandria Anger Other Clinician: Referring Provider: Treating Provider/Extender:Kele Barthelemy, Letitia Neri, Vivi Ferns in Treatment: 0 Constitutional Patient is hypertensive.. Pulse regular and within target range for patient.[name] Respirations regular, non-labored and within target range.. Temperature is normal and within the target range for the patient.[name] Appears in no distress. Respiratory work of breathing is normal. Bilateral breath sounds are clear and equal in all  lobes with no wheezes, rales or rhonchi.. Cardiovascular Heart sounds are normal does not appear to be dehydrated. Gastrointestinal (GI) Abdomen is soft no liver no spleen no masses. Genitourinary (GU) She is apparently incontinent but bladder is not distended. Integumentary (Hair, Skin) No primary skin issues are seen. Psychiatric Somewhat depressed affect.. Notes Wound exam; the area in question is in the lower sacrum. Small wound but with deep tunneling. Measurements roughly 2 x 3 x 2.6. At the base of this there is nonviable tissue which makes this unstageable. I did not feel any palpable bone. Soft tissue around the wound was nontender there is no crepitus no erythema and no purulent drainage. No cultures were done Electronic Signature(s) Signed: 06/28/2019 5:54:58 PM By: Linton Ham MD Entered By: Linton Ham on 06/28/2019 15:36:20 -------------------------------------------------------------------------------- Physician Orders Details Patient Name: Date of Service: Holly Banks, Holly Banks 06/28/2019 1:15 PM Medical Record [id number] Patient Account Number: [id number] Date of Birth/Sex: Treating RN: [birth year] (86 y.o. Female) Deon Pilling Primary Care Provider: Cassandria Anger Other  Clinician: Referring Provider: Treating Provider/Extender:Alexia Dinger, Letitia Neri, Vivi Ferns in Treatment: 0 Verbal / Phone Orders: No Diagnosis Coding Follow-up Appointments Return Appointment in 2 weeks. Dressing Change Frequency Change dressing every day. - home health to change three times a week. all other days family member to change. Skin Barriers/Peri-Wound Care Skin Prep Wound Cleansing Clean wound with Wound Cleanser Primary Wound Dressing Wound #1 Sacrum Santyl Ointment - backed by saline moisten gauze Secondary Dressing ABD pad - or foam border Off-Loading Low air-loss mattress (Group 2) - once arrives patient to use. Gel wheelchair cushion - continue to use specialty wheelchair cushion. Turn and reposition every 2 hours - use pillow while sitting and laying to offload pressure in chair and bed. Additional Orders / Instructions Follow Nutritious Diet - patient to increase protein and vitamins via food. patient to try to take in more calories may drink ensure or glucerna. [place] [facility] for wound care. [facility]. Patient Medications Allergies: penicillin, Ditropan Notifications Medication Indication Start End lidocaine DOSE topical 4 % gel - gel topical applied prior to debridement by MD. Annitta Needs 06/28/2019 DOSE topical 250 unit/gram ointment - ointment topical to wouind daily Electronic Signature(s) Signed: 06/28/2019 3:37:40 PM By: Linton Ham MD Entered By: Linton Ham on 06/28/2019 15:37:39 -------------------------------------------------------------------------------- Prescription 06/28/2019 Patient Name: Vilinda Flake Provider: Linton Ham MD Date of Birth: 06-Dec-1928 NPI#: [id number] Sex: Female DEA#: [id number] Phone #: [id number] License #: [id number] Patient Address: [facility], [place] [address] [address], [place] [address] [phone] Allergies penicillin Reaction: rash Severity: Moderate Ditropan Reaction: dry mouth Severity: Moderate Medication Medication: Route: Strength: Form: lidocaine 4 % topical gel topical 4% gel Class: TOPICAL LOCAL ANESTHETICS Dose: Frequency / Time: Indication: gel topical applied prior to debridement by MD. Number of Refills: Number of Units: 0 Generic Substitution: Start Date: End Date: One Time Use: Substitution Permitted No Note to Pharmacy: Signature(s): Date(s): Electronic Signature(s) Signed: 06/28/2019 5:54:58 PM By: Linton Ham MD Entered By: Linton Ham on 06/28/2019 15:37:41 --------------------------------------------------------------------------------  Problem List Details Patient Name: Date of Service: ZOEANN, STREAT 06/28/2019 1:15 PM Medical Record [id number] Patient Account Number: [id number] Date of Birth/Sex: [birth year] (86 y.o. Female) Treating RN: Primary Care Provider: Cassandria Anger Other Clinician: Referring Provider: Treating Provider/Extender:Othar Curto, Letitia Neri, Vivi Ferns in Treatment: 0 Active Problems ICD-10 Evaluated Encounter Code Description Active Date Today  Diagnosis L89.150 Pressure ulcer of sacral region, unstageable 06/28/2019 No Yes R26.89 Other abnormalities of gait and mobility 06/28/2019 No Yes E11.622 Type 2 diabetes mellitus with other skin ulcer 06/28/2019 No Yes Inactive Problems Resolved Problems Electronic Signature(s) Signed: 06/28/2019 5:54:58 PM By: Linton Ham MD Entered By: Linton Ham on 06/28/2019 15:31:34 -------------------------------------------------------------------------------- Progress Note Details Patient Name: Date of Service: Holly Banks, Holly Banks 06/28/2019 1:15 PM Medical Record [id number] Patient Account Number: [id number] Date of Birth/Sex: Treating RN: 1928/06/27 (86 y.o. Female) Primary Care Provider:  Cassandria Anger Other Clinician: Referring Provider: Treating Provider/Extender:Bonny Vanleeuwen, Letitia Neri, Vivi Ferns in Treatment: 0 Subjective Chief Complaint Information obtained from Patient 06/28/2019; patient is here for review of a pressure ulcer on the lower sacrum History of Present Illness (HPI) Admission 06/28/2019 This is a frail 86 year old woman who arrives with a 1-1/2 to 21-month history with an area on her lower sacrum. She lives at home relatively immobile secondary to diabetic neuropathy, osteoarthritis and gait and balance issues. She was seen by telemetry health however by her primary doctor in October and was noted to have a left buttock stage II. They were applying Tegaderm to this for a while but that did not help she is now just using dry gauze after cleaning the wound. She apparently is not eating well finds her taste declining. Has a hospital bed but not in over lying mattress which she certainly needs. I am not clear what she has on her wheelchair Past medical history; type 2 diabetes with peripheral neuropathy but a last hemoglobin A1c of 6.7 on 11/30, gout, hypertension, weakness, gait decline, osteoarthritis, blind in the right eye. Notable to have very poor oral intake Patient History Allergies penicillin (Severity: Moderate, Reaction: rash), Ditropan (Severity: Moderate, Reaction: dry mouth) Family History Cancer - Mother,Siblings, Diabetes - Siblings, Heart Disease - Father, Stroke, No family history of Hereditary Spherocytosis, Hypertension, Kidney Disease, Lung Disease, Seizures, Thyroid Problems, Tuberculosis. Social History Never smoker, Marital Status - Widowed, Alcohol Use - Never, Drug Use - No History, Caffeine Use - Rarely. Medical History Cardiovascular Patient has history of Hypertension Endocrine Patient has history of Type II Diabetes Musculoskeletal Patient has history of Gout, Osteoarthritis Neurologic Patient has history of  Neuropathy Patient is treated with Oral Agents. Blood sugar is not tested. Medical And Surgical History Notes Eyes Blind in right eye Gastrointestinal GERD, Gastritis Genitourinary CKD stage 3 Review of Systems (ROS) Constitutional Symptoms (General Health) Denies complaints or symptoms of Fatigue, Fever, Chills, Marked Weight Change. Eyes Complains or has symptoms of Glasses / Contacts - glasses. Denies complaints or symptoms of Dry Eyes, Vision Changes. Ear/Nose/Mouth/Throat Denies complaints or symptoms of Chronic sinus problems or rhinitis. Integumentary (Skin) Complains or has symptoms of Wounds - pressure ulcer on sacrum. Psychiatric Denies complaints or symptoms of Claustrophobia, Suicidal. Objective Constitutional Patient is hypertensive.. Pulse regular and within target range for patient.[name] Respirations regular, non-labored and within target range.. Temperature is normal and within the target range for the patient.[name] Appears in no distress. Vitals Time Taken: 2:02 PM, Height: 62 in, Source: Stated, Weight: 210 lbs, Source: Stated, BMI: 38.4, Temperature: 98.6 F, Pulse: 87 bpm, Respiratory Rate: 18 breaths/min, Blood Pressure: 150/67 mmHg. Respiratory work of breathing is normal. Bilateral breath sounds are clear and equal in all lobes with no wheezes, rales or rhonchi.. Cardiovascular Heart sounds are normal does not appear to be dehydrated. Gastrointestinal (GI) Abdomen is soft no liver no spleen no masses. Genitourinary (GU) She is apparently incontinent but bladder is not  distended. Psychiatric Somewhat depressed affect.. General Notes: Wound exam; the area in question is in the lower sacrum. Small wound but with deep tunneling. Measurements roughly 2 x 3 x 2.6. At the base of this there is nonviable tissue which makes this unstageable. I did not feel any palpable bone. Soft tissue around the wound was nontender there is no crepitus no erythema and no purulent  drainage. No cultures were done Integumentary (Hair, Skin) No primary skin issues are seen. Wound #1 status is Open. Original cause of wound was Pressure Injury. The wound is located on the Sacrum. The wound measures 2.5cm length x 3cm width x 2.6cm depth; 5.89cm^2 area and 15.315cm^3 volume. There is Fat Layer (Subcutaneous Tissue) Exposed exposed. There is no tunneling or undermining noted. There is a medium amount of serosanguineous drainage noted. The wound margin is well defined and not attached to the wound base. There is small (1-33%) red, pink granulation within the wound bed. There is a large (67-100%) amount of necrotic tissue within the wound bed including Adherent Slough. Assessment Active Problems ICD-10 Pressure ulcer of sacral region, unstageable Other abnormalities of gait and mobility Type 2 diabetes mellitus with other skin ulcer Procedures Wound #1 Pre-procedure diagnosis of Wound #1 is a Pressure Ulcer located on the Sacrum . There was a Chemical/Enzymatic/Mechanical debridement performed by Baruch Gouty, RN. after achieving pain control using Lidocaine 4% Topical Solution. Agent used was [employer]. A time out was conducted at 15:23, prior to the start of the procedure. There was no bleeding. The procedure was tolerated well with a pain level of 0 throughout and a pain level of 0 following the procedure. Post Debridement Measurements: 2.5cm length x 3cm width x 2.6cm depth; 15.315cm^3 volume. Post debridement Stage noted as Unstageable/Unclassified. Character of Wound/Ulcer Post Debridement is improved. Post procedure Diagnosis Wound #1: Same as Pre-Procedure Plan Follow-up Appointments: Return Appointment in 2 weeks. Dressing Change Frequency: Change dressing every day. - home health to change three times a week. all other days family member to change. Skin Barriers/Peri-Wound Care: Skin Prep Wound Cleansing: Clean wound with Wound Cleanser Primary Wound  Dressing: Wound #1 Sacrum: Santyl Ointment - backed by saline moisten gauze Secondary Dressing: ABD pad - or foam border Off-Loading: Low air-loss mattress (Group 2) - once arrives patient to use. Gel wheelchair cushion - continue to use specialty wheelchair cushion. Turn and reposition every 2 hours - use pillow while sitting and laying to offload pressure in chair and bed. Additional Orders / Instructions: Follow Nutritious Diet - patient to increase protein and vitamins via food. patient to try to take in more calories may drink ensure or glucerna. Home Health: [facility] for wound care. [facility]. The following medication(s) was prescribed: lidocaine topical 4 % gel gel topical applied prior to debridement by MD. was prescribed at facility Santyl topical 250 unit/gram ointment ointment topical to wouind daily starting 06/28/2019 1. Santyl with backing wet-to-dry 2. Change this daily 3. The extent of this woman's protein calorie malnutrition is unclear but I told them to basically forget about any diet restrictions that she might of been otherwise suitable for. She simply needs to eat. She has an sure. 4. Fastidious attention to offloading this area both in bed and in a chair and I explained this to them in some detail. She certainly could benefit from the level 3 pressure relief surface that I think is already been ordered or at least requested to her primary doctor  5. No evidence of infection I spent 35 minutes in the research, face-to-face examination and preparation of this record Electronic Signature(s) Signed: 06/28/2019 5:54:58 PM By: Linton Ham MD Entered By: Linton Ham on 06/28/2019 15:39:11 -------------------------------------------------------------------------------- HxROS Details Patient Name: Date of Service: Holly Banks, Holly Banks 06/28/2019 1:15 PM Medical Record [id number] Patient Account Number: [id number] Date of  Birth/Sex: Treating RN: [birth year] (86 y.o. Female) Levan Hurst Primary Care Provider: Cassandria Anger Other Clinician: Referring Provider: Treating Provider/Extender:Maranda Marte, Letitia Neri, Vivi Ferns in Treatment: 0 Constitutional Symptoms (General Health) Complaints and Symptoms: Negative for: Fatigue; Fever; Chills; Marked Weight Change Eyes Complaints and Symptoms: Positive for: Glasses / Contacts - glasses Negative for: Dry Eyes; Vision Changes Medical History: Past Medical History Notes: Blind in right eye Ear/Nose/Mouth/Throat Complaints and Symptoms: Negative for: Chronic sinus problems or rhinitis Integumentary (Skin) Complaints and Symptoms: Positive for: Wounds - pressure ulcer on sacrum Psychiatric Complaints and Symptoms: Negative for: Claustrophobia; Suicidal Hematologic/Lymphatic Respiratory Cardiovascular Medical History: Positive for: Hypertension Gastrointestinal Medical History: Past Medical History Notes: GERD, Gastritis Endocrine Medical History: Positive for: Type II Diabetes Time with diabetes: over 20 years Treated with: Oral agents Blood sugar tested every day: No Genitourinary Medical History: Past Medical History Notes: CKD stage 3 Musculoskeletal Medical History: Positive for: Gout; Osteoarthritis Neurologic Medical History: Positive for: Neuropathy Oncologic Immunizations Pneumococcal Vaccine: Received Pneumococcal Vaccination: No Implantable Devices None Family and Social History Cancer: Yes - Mother,Siblings; Diabetes: Yes - Siblings; Heart Disease: Yes - Father; Hereditary Spherocytosis: No; Hypertension: No; Kidney Disease: No; Lung Disease: No; Seizures: No; Stroke: Yes; Thyroid Problems: No; Tuberculosis: No; Never smoker; Marital Status - Widowed; Alcohol Use: Never; Drug Use: No History; Caffeine Use: Rarely; Financial Concerns: No; Food, Clothing or Shelter Needs: No; Support System Lacking:  No; Transportation Concerns: No Engineer, maintenance) Signed: 06/28/2019 5:54:58 PM By: Linton Ham MD Signed: 07/02/2019 6:46:58 PM By: Levan Hurst RN, BSN Entered By: Levan Hurst on 06/28/2019 14:17:45 -------------------------------------------------------------------------------- [place] Details Patient Name: Date of Service: Holly Banks, Holly Banks 06/28/2019 Medical Record [id number] Patient Account Number: [id number] Date of Birth/Sex: Treating RN: 12-11-1928 (86 y.o. Female) Deon Pilling Primary Care Provider: Cassandria Anger Other Clinician: Referring Provider: Treating Provider/Extender:Nilesh Stegall, Letitia Neri, Vivi Ferns in Treatment: 0 Diagnosis Coding ICD-10 Codes Code Description L89.150 Pressure ulcer of sacral region, unstageable R26.89 Other abnormalities of gait and mobility E11.622 Type 2 diabetes mellitus with other skin ulcer Facility Procedures The patient participates with Medicare or their insurance follows the Medicare Facility Guidelines: CPT4 Code Description Modifier Quantity [id number] [place] VISIT-LEV 3 EST PT 1 The patient participates with Medicare or their insurance follows the Medicare Facility Guidelines: [id number] - [place] W/O ANES NON SELECT 1 Physician Procedures CPT4 Code: [id number] Description: WC PHYS LEVEL 3 NEW PT ICD-10 Diagnosis Description L89.150 Pressure ulcer of sacral region, unstageable R26.89 Other abnormalities of gait and mobility E11.622 Type 2 diabetes mellitus with other skin ulcer Modifier: Quantity: 1 Electronic Signature(s) Signed: 06/28/2019 5:54:58 PM By: Linton Ham MD Entered By: Linton Ham on 06/28/2019 15:39:29

## 2019-07-04 DIAGNOSIS — I129 Hypertensive chronic kidney disease with stage 1 through stage 4 chronic kidney disease, or unspecified chronic kidney disease: Secondary | ICD-10-CM | POA: Diagnosis not present

## 2019-07-04 DIAGNOSIS — R627 Adult failure to thrive: Secondary | ICD-10-CM | POA: Diagnosis not present

## 2019-07-04 DIAGNOSIS — L89322 Pressure ulcer of left buttock, stage 2: Secondary | ICD-10-CM | POA: Diagnosis not present

## 2019-07-04 DIAGNOSIS — E538 Deficiency of other specified B group vitamins: Secondary | ICD-10-CM | POA: Diagnosis not present

## 2019-07-04 DIAGNOSIS — N1832 Chronic kidney disease, stage 3b: Secondary | ICD-10-CM | POA: Diagnosis not present

## 2019-07-04 DIAGNOSIS — M549 Dorsalgia, unspecified: Secondary | ICD-10-CM | POA: Diagnosis not present

## 2019-07-04 DIAGNOSIS — E669 Obesity, unspecified: Secondary | ICD-10-CM | POA: Diagnosis not present

## 2019-07-04 DIAGNOSIS — E1122 Type 2 diabetes mellitus with diabetic chronic kidney disease: Secondary | ICD-10-CM | POA: Diagnosis not present

## 2019-07-04 DIAGNOSIS — E559 Vitamin D deficiency, unspecified: Secondary | ICD-10-CM | POA: Diagnosis not present

## 2019-07-06 DIAGNOSIS — R627 Adult failure to thrive: Secondary | ICD-10-CM | POA: Diagnosis not present

## 2019-07-06 DIAGNOSIS — E1122 Type 2 diabetes mellitus with diabetic chronic kidney disease: Secondary | ICD-10-CM | POA: Diagnosis not present

## 2019-07-06 DIAGNOSIS — E559 Vitamin D deficiency, unspecified: Secondary | ICD-10-CM | POA: Diagnosis not present

## 2019-07-06 DIAGNOSIS — N1832 Chronic kidney disease, stage 3b: Secondary | ICD-10-CM | POA: Diagnosis not present

## 2019-07-06 DIAGNOSIS — L89322 Pressure ulcer of left buttock, stage 2: Secondary | ICD-10-CM | POA: Diagnosis not present

## 2019-07-06 DIAGNOSIS — E538 Deficiency of other specified B group vitamins: Secondary | ICD-10-CM | POA: Diagnosis not present

## 2019-07-06 DIAGNOSIS — E669 Obesity, unspecified: Secondary | ICD-10-CM | POA: Diagnosis not present

## 2019-07-06 DIAGNOSIS — I129 Hypertensive chronic kidney disease with stage 1 through stage 4 chronic kidney disease, or unspecified chronic kidney disease: Secondary | ICD-10-CM | POA: Diagnosis not present

## 2019-07-06 DIAGNOSIS — M549 Dorsalgia, unspecified: Secondary | ICD-10-CM | POA: Diagnosis not present

## 2019-07-09 ENCOUNTER — Telehealth: Payer: Self-pay

## 2019-07-09 DIAGNOSIS — E669 Obesity, unspecified: Secondary | ICD-10-CM | POA: Diagnosis not present

## 2019-07-09 DIAGNOSIS — R627 Adult failure to thrive: Secondary | ICD-10-CM | POA: Diagnosis not present

## 2019-07-09 DIAGNOSIS — I129 Hypertensive chronic kidney disease with stage 1 through stage 4 chronic kidney disease, or unspecified chronic kidney disease: Secondary | ICD-10-CM | POA: Diagnosis not present

## 2019-07-09 DIAGNOSIS — E1122 Type 2 diabetes mellitus with diabetic chronic kidney disease: Secondary | ICD-10-CM | POA: Diagnosis not present

## 2019-07-09 DIAGNOSIS — N1832 Chronic kidney disease, stage 3b: Secondary | ICD-10-CM | POA: Diagnosis not present

## 2019-07-09 DIAGNOSIS — M549 Dorsalgia, unspecified: Secondary | ICD-10-CM | POA: Diagnosis not present

## 2019-07-09 DIAGNOSIS — E559 Vitamin D deficiency, unspecified: Secondary | ICD-10-CM | POA: Diagnosis not present

## 2019-07-09 DIAGNOSIS — L89322 Pressure ulcer of left buttock, stage 2: Secondary | ICD-10-CM | POA: Diagnosis not present

## 2019-07-09 DIAGNOSIS — E538 Deficiency of other specified B group vitamins: Secondary | ICD-10-CM | POA: Diagnosis not present

## 2019-07-09 NOTE — Telephone Encounter (Signed)
Okay.  Thanks.

## 2019-07-09 NOTE — Telephone Encounter (Signed)
Home health nurse called in wanting to let Dr know that patient has a new wound to her right heel. pressure wound. stage 2 minor stage 2. 2 cm 5.5.  Home health would like to do no adhesin bandage and also change it 3x a week, care protective ointment or as needed.  Home health is also asking for there patient to receive glaucous meter and kit Rx sent to her pharmacy as she does not have any way to check her sugars and also needs to check is as many times as she can    Patient is also being discharged from health health, but home health is asking for 4 more weeks if this is possible    Please call and advise for better understanding

## 2019-07-09 NOTE — Telephone Encounter (Signed)
Please advise 

## 2019-07-11 DIAGNOSIS — E538 Deficiency of other specified B group vitamins: Secondary | ICD-10-CM | POA: Diagnosis not present

## 2019-07-11 DIAGNOSIS — E559 Vitamin D deficiency, unspecified: Secondary | ICD-10-CM | POA: Diagnosis not present

## 2019-07-11 DIAGNOSIS — M549 Dorsalgia, unspecified: Secondary | ICD-10-CM | POA: Diagnosis not present

## 2019-07-11 DIAGNOSIS — E1122 Type 2 diabetes mellitus with diabetic chronic kidney disease: Secondary | ICD-10-CM | POA: Diagnosis not present

## 2019-07-11 DIAGNOSIS — I129 Hypertensive chronic kidney disease with stage 1 through stage 4 chronic kidney disease, or unspecified chronic kidney disease: Secondary | ICD-10-CM | POA: Diagnosis not present

## 2019-07-11 DIAGNOSIS — R627 Adult failure to thrive: Secondary | ICD-10-CM | POA: Diagnosis not present

## 2019-07-11 DIAGNOSIS — N1832 Chronic kidney disease, stage 3b: Secondary | ICD-10-CM | POA: Diagnosis not present

## 2019-07-11 DIAGNOSIS — E669 Obesity, unspecified: Secondary | ICD-10-CM | POA: Diagnosis not present

## 2019-07-11 DIAGNOSIS — L89322 Pressure ulcer of left buttock, stage 2: Secondary | ICD-10-CM | POA: Diagnosis not present

## 2019-07-11 NOTE — Telephone Encounter (Signed)
Tried calling Holly Banks there was no answer and can't leave msg due to vm being full.Marland KitchenJohny Chess

## 2019-07-12 ENCOUNTER — Encounter (HOSPITAL_BASED_OUTPATIENT_CLINIC_OR_DEPARTMENT_OTHER): Payer: Medicare HMO | Admitting: Internal Medicine

## 2019-07-13 ENCOUNTER — Telehealth: Payer: Self-pay | Admitting: Internal Medicine

## 2019-07-13 DIAGNOSIS — E559 Vitamin D deficiency, unspecified: Secondary | ICD-10-CM | POA: Diagnosis not present

## 2019-07-13 DIAGNOSIS — M549 Dorsalgia, unspecified: Secondary | ICD-10-CM | POA: Diagnosis not present

## 2019-07-13 DIAGNOSIS — E1122 Type 2 diabetes mellitus with diabetic chronic kidney disease: Secondary | ICD-10-CM | POA: Diagnosis not present

## 2019-07-13 DIAGNOSIS — I129 Hypertensive chronic kidney disease with stage 1 through stage 4 chronic kidney disease, or unspecified chronic kidney disease: Secondary | ICD-10-CM | POA: Diagnosis not present

## 2019-07-13 DIAGNOSIS — E669 Obesity, unspecified: Secondary | ICD-10-CM | POA: Diagnosis not present

## 2019-07-13 DIAGNOSIS — N1832 Chronic kidney disease, stage 3b: Secondary | ICD-10-CM | POA: Diagnosis not present

## 2019-07-13 DIAGNOSIS — R627 Adult failure to thrive: Secondary | ICD-10-CM | POA: Diagnosis not present

## 2019-07-13 DIAGNOSIS — E538 Deficiency of other specified B group vitamins: Secondary | ICD-10-CM | POA: Diagnosis not present

## 2019-07-13 DIAGNOSIS — L89322 Pressure ulcer of left buttock, stage 2: Secondary | ICD-10-CM | POA: Diagnosis not present

## 2019-07-13 NOTE — Telephone Encounter (Signed)
Has a wound that Dr. Camila Li is aware of.  Wound is getting worse.  Moving from stage 2 to stage 3.  Believes that infection has set in.   Patient has appt with wound care this coming Thursday.   Patients daughter is going to try to move this appt up.

## 2019-07-14 NOTE — Telephone Encounter (Signed)
Noted.  Continue wound care per home health.  Thanks

## 2019-07-16 DIAGNOSIS — I129 Hypertensive chronic kidney disease with stage 1 through stage 4 chronic kidney disease, or unspecified chronic kidney disease: Secondary | ICD-10-CM | POA: Diagnosis not present

## 2019-07-16 DIAGNOSIS — E559 Vitamin D deficiency, unspecified: Secondary | ICD-10-CM | POA: Diagnosis not present

## 2019-07-16 DIAGNOSIS — N1832 Chronic kidney disease, stage 3b: Secondary | ICD-10-CM | POA: Diagnosis not present

## 2019-07-16 DIAGNOSIS — R627 Adult failure to thrive: Secondary | ICD-10-CM | POA: Diagnosis not present

## 2019-07-16 DIAGNOSIS — E1122 Type 2 diabetes mellitus with diabetic chronic kidney disease: Secondary | ICD-10-CM | POA: Diagnosis not present

## 2019-07-16 DIAGNOSIS — L89322 Pressure ulcer of left buttock, stage 2: Secondary | ICD-10-CM | POA: Diagnosis not present

## 2019-07-16 DIAGNOSIS — M549 Dorsalgia, unspecified: Secondary | ICD-10-CM | POA: Diagnosis not present

## 2019-07-16 DIAGNOSIS — E538 Deficiency of other specified B group vitamins: Secondary | ICD-10-CM | POA: Diagnosis not present

## 2019-07-16 DIAGNOSIS — E669 Obesity, unspecified: Secondary | ICD-10-CM | POA: Diagnosis not present

## 2019-07-17 ENCOUNTER — Other Ambulatory Visit: Payer: Self-pay

## 2019-07-17 ENCOUNTER — Encounter (HOSPITAL_BASED_OUTPATIENT_CLINIC_OR_DEPARTMENT_OTHER): Payer: Medicare HMO | Attending: Internal Medicine | Admitting: Internal Medicine

## 2019-07-17 DIAGNOSIS — E1142 Type 2 diabetes mellitus with diabetic polyneuropathy: Secondary | ICD-10-CM | POA: Insufficient documentation

## 2019-07-17 DIAGNOSIS — H5461 Unqualified visual loss, right eye, normal vision left eye: Secondary | ICD-10-CM | POA: Insufficient documentation

## 2019-07-17 DIAGNOSIS — L89150 Pressure ulcer of sacral region, unstageable: Secondary | ICD-10-CM | POA: Diagnosis not present

## 2019-07-17 DIAGNOSIS — L89612 Pressure ulcer of right heel, stage 2: Secondary | ICD-10-CM | POA: Diagnosis not present

## 2019-07-17 DIAGNOSIS — L89159 Pressure ulcer of sacral region, unspecified stage: Secondary | ICD-10-CM | POA: Diagnosis not present

## 2019-07-17 DIAGNOSIS — I10 Essential (primary) hypertension: Secondary | ICD-10-CM | POA: Insufficient documentation

## 2019-07-17 DIAGNOSIS — E11622 Type 2 diabetes mellitus with other skin ulcer: Secondary | ICD-10-CM | POA: Diagnosis not present

## 2019-07-17 DIAGNOSIS — M109 Gout, unspecified: Secondary | ICD-10-CM | POA: Insufficient documentation

## 2019-07-17 DIAGNOSIS — E1151 Type 2 diabetes mellitus with diabetic peripheral angiopathy without gangrene: Secondary | ICD-10-CM | POA: Diagnosis not present

## 2019-07-17 DIAGNOSIS — M1990 Unspecified osteoarthritis, unspecified site: Secondary | ICD-10-CM | POA: Insufficient documentation

## 2019-07-17 NOTE — Progress Notes (Addendum)
LAILYNN, RILING ([id number]) Visit Report for 07/17/2019 HPI Details Patient Name: Date of Service: Holly Banks, Holly Banks 07/17/2019 1:15 PM Medical Record [id number] Patient Account Number: [id number] Date of Birth/Sex: Treating RN: January 04, 1929 (86 y.o. Female) Carlene Coria Primary Care Provider: Cassandria Anger Other Clinician: Referring Provider: Treating Provider/Extender:Floyed Masoud, Letitia Neri, Vivi Ferns in Treatment: 2 History of Present Illness HPI Description: Admission 06/28/2019 This is a frail 86 year old woman who arrives with a 1-1/2 to 6-month history with an area on her lower sacrum. She lives at home relatively immobile secondary to diabetic neuropathy, osteoarthritis and gait and balance issues. She was seen by telemetry health however by her primary doctor in October and was noted to have a left buttock stage II. They were applying Tegaderm to this for a while but that did not help she is now just using dry gauze after cleaning the wound. She apparently is not eating well finds her taste declining. Has a hospital bed but not in over lying mattress which she certainly needs. I am not clear what she has on her wheelchair Past medical history; type 2 diabetes with peripheral neuropathy but a last hemoglobin A1c of 6.7 on 11/30, gout, hypertension, weakness, gait decline, osteoarthritis, blind in the right eye. Notable to have very poor oral intake 2/9; patient arrives with the wound in her sacrum. This apparently has improved via our intake nurses measurements. We have been using Santyl. Notable that she has a very shallow stage II wound on the right heel tip which is a new wound this visit Electronic Signature(s) Signed: 07/17/2019 5:08:08 PM By: Linton Ham MD Entered By: Linton Ham on 07/17/2019 14:52:49 -------------------------------------------------------------------------------- Physical Exam Details Patient Name: Date of Service: Banks, Holly 07/17/2019 1:15 PM Medical Record [id number] Patient Account Number: [id number] Date of Birth/Sex: Treating RN: 1929/02/28 (86 y.o. Female) Carlene Coria Primary Care Provider: Cassandria Anger Other Clinician: Referring Provider: Treating Provider/Extender:Darianna Amy, Letitia Neri, Vivi Ferns in Treatment: 2 Constitutional Patient is hypertensive.. Pulse regular and within target range for patient.[name] Respirations regular, non-labored and within target range.. Temperature is normal and within the target range for the patient.[name] Appears in no distress. Notes Wound exam; the area in question is on the lower sacrum. The measurements are better this week. The base of this actually looks a lot better. No evidence of surrounding infection. No debridement was necessary On the tip of the right heel she has a superficial stage II ulceration. There is no evidence of infection here either. Electronic Signature(s) Signed: 07/17/2019 5:08:08 PM By: Linton Ham MD Entered By: Linton Ham on 07/17/2019 14:54:24 -------------------------------------------------------------------------------- Physician Orders Details Patient Name: Date of Service: Banks, [name] 07/17/2019 1:15 PM Medical Record [id number] Patient Account Number: [id number] Date of Birth/Sex: Treating RN: 1928-07-27 (86 y.o. Female) Carlene Coria Primary Care Provider: Cassandria Anger Other Clinician: Referring Provider: Treating Provider/Extender:Uyen Eichholz, Letitia Neri, Vivi Ferns in Treatment: 2 Verbal / Phone Orders: No Diagnosis Coding ICD-10 Coding Code Description L89.150 Pressure ulcer of sacral region, unstageable R26.89 Other abnormalities of gait and mobility E11.622 Type 2 diabetes mellitus with other skin ulcer Follow-up Appointments Return Appointment in 2 weeks. Dressing Change Frequency Wound #1 Sacrum Change Dressing every other day. - home health to change three  times a week. all other days family member to change. Wound #2 Right Calcaneus Change Dressing every other day. - home health to change three times a week. all other days family member to change. Skin Barriers/Peri-Wound Care Wound #1 Sacrum Skin Prep Wound #2  Right Calcaneus Skin Prep Wound Cleansing Wound #1 Sacrum Clean wound with Wound Cleanser Wound #2 Right Calcaneus Clean wound with Wound Cleanser Primary Wound Dressing Wound #1 Sacrum Collagen - moisten with normal saline or hydrogel Wound #2 Right Calcaneus Collagen - moisten with normal saline or hydrogel Secondary Dressing Wound #1 Sacrum ABD pad - or foam border Wound #2 Right Calcaneus Foam Border Off-Loading Low air-loss mattress (Group 2) - once arrives patient to use. Gel wheelchair cushion - continue to use specialty wheelchair cushion. Turn and reposition every 2 hours - use pillow while sitting and laying to offload pressure in chair and bed. Additional Orders / Instructions Follow Nutritious Diet - patient to increase protein and vitamins via food. patient to try to take in more calories may drink ensure or glucerna. [place] [facility] for wound care. [facility]. Electronic Signature(s) Signed: 07/18/2019 4:53:37 PM By: Carlene Coria RN Signed: 07/20/2019 1:06:39 PM By: Linton Ham MD Previous Signature: 07/17/2019 5:08:08 PM Version By: Linton Ham MD Previous Signature: 07/17/2019 5:19:34 PM Version By: Carlene Coria RN Entered By: Carlene Coria on 07/18/2019 15:13:50 -------------------------------------------------------------------------------- Problem List Details Patient Name: Date of Service: Banks, Holly 07/17/2019 1:15 PM Medical Record [id number] Patient Account Number: [id number] Date of Birth/Sex: Treating RN: [birth year] (86 y.o. Female) Carlene Coria Primary Care Provider: Cassandria Anger Other Clinician: Referring Provider: Treating  Provider/Extender:Neelie Welshans, Letitia Neri, Vivi Ferns in Treatment: 2 Active Problems ICD-10 Evaluated Encounter Code Description Active Date Today Diagnosis L89.150 Pressure ulcer of sacral region, unstageable 06/28/2019 No Yes R26.89 Other abnormalities of gait and mobility 06/28/2019 No Yes E11.622 Type 2 diabetes mellitus with other skin ulcer 06/28/2019 No Yes L89.612 Pressure ulcer of right heel, stage 2 07/17/2019 No Yes Inactive Problems Resolved Problems Electronic Signature(s) Signed: 07/17/2019 5:08:08 PM By: Linton Ham MD Entered By: Linton Ham on 07/17/2019 14:51:11 -------------------------------------------------------------------------------- Progress Note Details Patient Name: Date of Service: ELAYSIA, PETTINATO 07/17/2019 1:15 PM Medical Record [id number] Patient Account Number: [id number] Date of Birth/Sex: Treating RN: 1929-05-09 (86 y.o. Female) Carlene Coria Primary Care Provider: Cassandria Anger Other Clinician: Referring Provider: Treating Provider/Extender:Lexani Corona, Letitia Neri, Vivi Ferns in Treatment: 2 Subjective History of Present Illness (HPI) Admission 06/28/2019 This is a frail 86 year old woman who arrives with a 1-1/2 to 43-month history with an area on her lower sacrum. She lives at home relatively immobile secondary to diabetic neuropathy, osteoarthritis and gait and balance issues. She was seen by telemetry health however by her primary doctor in October and was noted to have a left buttock stage II. They were applying Tegaderm to this for a while but that did not help she is now just using dry gauze after cleaning the wound. She apparently is not eating well finds her taste declining. Has a hospital bed but not in over lying mattress which she certainly needs. I am not clear what she has on her wheelchair Past medical history; type 2 diabetes with peripheral neuropathy but a last hemoglobin A1c of 6.7 on 11/30,  gout, hypertension, weakness, gait decline, osteoarthritis, blind in the right eye. Notable to have very poor oral intake 2/9; patient arrives with the wound in her sacrum. This apparently has improved via our intake nurses measurements. We have been using Santyl. Notable that she has a very shallow stage II wound on the right heel tip which is a new wound this visit Objective Constitutional Patient is hypertensive.. Pulse regular and within target range for patient.[name] Respirations regular,  non-labored and within target range.. Temperature is normal and within the target range for the patient.[name] Appears in no distress. Vitals Time Taken: 2:12 PM, Height: 62 in, Weight: 210 lbs, BMI: 38.4, Temperature: 98.2 F, Pulse: 61 bpm, Respiratory Rate: 18 breaths/min, Blood Pressure: 151/53 mmHg. General Notes: Wound exam; the area in question is on the lower sacrum. The measurements are better this week. The base of this actually looks a lot better. No evidence of surrounding infection. No debridement was necessary ooOn the tip of the right heel she has a superficial stage II ulceration. There is no evidence of infection here either. Integumentary (Hair, Skin) Wound #1 status is Open. Original cause of wound was Pressure Injury. The wound is located on the Sacrum. The wound measures 1.6cm length x 2.1cm width x 1.4cm depth; 2.639cm^2 area and 3.695cm^3 volume. There is Fat Layer (Subcutaneous Tissue) Exposed exposed. There is no tunneling noted, however, there is undermining starting at 10:00 and ending at 3:00 with a maximum distance of 1.1cm. There is a medium amount of serosanguineous drainage noted. The wound margin is well defined and not attached to the wound base. There is large (67-100%) red granulation within the wound bed. There is a small (1-33%) amount of necrotic tissue within the wound bed including Adherent Slough. Wound #2 status is Open. Original cause of wound was Pressure Injury. The  wound is located on the Right Calcaneus. The wound measures 0.5cm length x 0.5cm width x 0.1cm depth; 0.196cm^2 area and 0.02cm^3 volume. There is Fat Layer (Subcutaneous Tissue) Exposed exposed. There is no tunneling or undermining noted. There is a small amount of serosanguineous drainage noted. The wound margin is flat and intact. There is large (67- 100%) pink granulation within the wound bed. There is no necrotic tissue within the wound bed. Assessment Active Problems ICD-10 Pressure ulcer of sacral region, unstageable Other abnormalities of gait and mobility Type 2 diabetes mellitus with other skin ulcer Pressure ulcer of right heel, stage 2 Plan Follow-up Appointments: Return Appointment in 2 weeks. Dressing Change Frequency: Wound #1 Sacrum: Change dressing every day. - home health to change three times a week. all other days family member to change. Wound #2 Right Calcaneus: Change dressing every day. - home health to change three times a week. all other days family member to change. Skin Barriers/Peri-Wound Care: Wound #1 Sacrum: Skin Prep Wound #2 Right Calcaneus: Skin Prep Wound Cleansing: Wound #1 Sacrum: Clean wound with Wound Cleanser Wound #2 Right Calcaneus: Clean wound with Wound Cleanser Primary Wound Dressing: Wound #1 Sacrum: Collagen - moisten with normal saline or hydrogel Wound #2 Right Calcaneus: Collagen - moisten with normal saline or hydrogel Secondary Dressing: Wound #1 Sacrum: ABD pad - or foam border Wound #2 Right Calcaneus: Foam Border Off-Loading: Low air-loss mattress (Group 2) - once arrives patient to use. Gel wheelchair cushion - continue to use specialty wheelchair cushion. Turn and reposition every 2 hours - use pillow while sitting and laying to offload pressure in chair and bed. Additional Orders / Instructions: Follow Nutritious Diet - patient to increase protein and vitamins via food. patient to try to take in more calories  may drink ensure or glucerna. Home Health: [facility] [facility] for wound care. [facility]. 1. I am going to change the primary dressing to the sacrum to silver collagen back with moist gauze 2. Silver collagen to the right heel as well. 3. Both of these can be changed every second day 4.  I talked to her family member about offloading these areas careful attention especially at night Electronic Signature(s) Signed: 07/17/2019 5:08:08 PM By: Linton Ham MD Entered By: Linton Ham on 07/17/2019 14:55:34 -------------------------------------------------------------------------------- SuperBill Details Patient Name: Date of Service: DEHLIA, FORDICE 07/17/2019 Medical Record [id number] Patient Account Number: [id number] Date of Birth/Sex: Treating RN: 17-Aug-1928 (86 y.o. Female) Carlene Coria Primary Care Provider: Cassandria Anger Other Clinician: Referring Provider: Treating Provider/Extender:Ezequiel Macauley, Letitia Neri, Vivi Ferns in Treatment: 2 Diagnosis Coding ICD-10 Codes Code Description L89.150 Pressure ulcer of sacral region, unstageable R26.89 Other abnormalities of gait and mobility E11.622 Type 2 diabetes mellitus with other skin ulcer L89.612 Pressure ulcer of right heel, stage 2 Facility Procedures The patient participates with Medicare or their insurance follows the Medicare Facility Guidelines: CPT4 Code Description Modifier Quantity [id number] [place] VISIT-LEV 4 EST PT 1 Physician Procedures CPT4 Code: [id number] Description: [id number] - WC PHYS LEVEL 3 - EST PT ICD-10 Diagnosis Description L89.150 Pressure ulcer of sacral region, unstageable L89.612 Pressure ulcer of right heel, stage 2 Modifier: Quantity: 1 Electronic Signature(s) Signed: 07/17/2019 5:08:08 PM By: Linton Ham MD Entered By: Linton Ham on 07/17/2019 14:56:14

## 2019-07-17 NOTE — Progress Notes (Addendum)
CHANETTE, KILL ([id number]) Visit Report for 07/17/2019 Arrival Information Details Patient Name: Date of Service: Holly Banks, Holly Banks 07/17/2019 1:15 PM Medical Record [id number] Patient Account Number: [id number] Date of Birth/Sex: Treating RN: [birth year] (86 y.o. Female) Levan Hurst Primary Care Kenya Shiraishi: Cassandria Anger Other Clinician: Referring Altin Sease: Treating Gauri Galvao/Extender:Robson, Letitia Neri, Vivi Ferns in Treatment: 2 Visit Information History Since Last Visit Added or deleted any medications: No Patient Arrived: Wheel Chair Any new allergies or adverse reactions: No Arrival Time: 14:11 Had a fall or experienced change in No activities of daily living that may affect Accompanied By: daughter risk of falls: Transfer Assistance: None Signs or symptoms of abuse/neglect since last No Patient Identification Verified: Yes visito Secondary Verification Process Completed: Yes Hospitalized since last visit: No Patient Requires Transmission-Based No Implantable device outside of the clinic excluding No Precautions: cellular tissue based products placed in the center Patient Has Alerts: No since last visit: Has Dressing in Place as Prescribed: Yes Pain Present Now: No Electronic Signature(s) Signed: 07/17/2019 5:30:42 PM By: Levan Hurst RN, BSN Entered By: Levan Hurst on 07/17/2019 14:11:40 -------------------------------------------------------------------------------- Clinic Level of Care Assessment Details Patient Name: Date of Service: Holly Banks, Holly Banks 07/17/2019 1:15 PM Medical Record [id number] Patient Account Number: [id number] Date of Birth/Sex: Treating RN: [birth year] (86 y.o. Female) Epps, Morey Hummingbird Primary Care Shatha Hooser: Cassandria Anger Other Clinician: Referring Charle Clear: Treating Lieutenant Abarca/Extender:Robson, Letitia Neri, Vivi Ferns in Treatment: 2 Clinic Level of Care Assessment Items TOOL 4 Quantity Score X  - Use when only an EandM is performed on FOLLOW-UP visit 1 0 ASSESSMENTS - Nursing Assessment / Reassessment X - Reassessment of Co-morbidities (includes updates in patient status) 1 10 X - Reassessment of Adherence to Treatment Plan 1 5 ASSESSMENTS - Wound and Skin Assessment / Reassessment []  - Simple Wound Assessment / Reassessment - one wound 0 X - Complex Wound Assessment / Reassessment - multiple wounds 2 5 []  - Dermatologic / Skin Assessment (not related to wound area) 0 ASSESSMENTS - Focused Assessment []  - Circumferential Edema Measurements - multi extremities 0 []  - Nutritional Assessment / Counseling / Intervention 0 []  - Lower Extremity Assessment (monofilament, tuning fork, pulses) 0 []  - Peripheral Arterial Disease Assessment (using hand held doppler) 0 ASSESSMENTS - Ostomy and/or Continence Assessment and Care []  - Incontinence Assessment and Management 0 []  - Ostomy Care Assessment and Management (repouching, etc.) 0 PROCESS - Coordination of Care X - Simple Patient / Family Education for ongoing care 1 15 []  - Complex (extensive) Patient / Family Education for ongoing care 0 X - Staff obtains Programmer, systems, Records, Test Results / Process Orders 1 10 []  - Staff telephones HHA, Nursing Homes / Clarify orders / etc 0 []  - Routine Transfer to another Facility (non-emergent condition) 0 []  - Routine Hospital Admission (non-emergent condition) 0 []  - New Admissions / Biomedical engineer / Ordering NPWT, Apligraf, etc. 0 []  - Emergency Hospital Admission (emergent condition) 0 X - Simple Discharge Coordination 1 10 []  - Complex (extensive) Discharge Coordination 0 PROCESS - Special Needs []  - Pediatric / Minor Patient Management 0 []  - Isolation Patient Management 0 []  - Hearing / Language / Visual special needs 0 []  - Assessment of Community assistance (transportation, D/C planning, etc.) 0 []  - Additional assistance / Altered mentation 0 []  - Support Surface(s)  Assessment (bed, cushion, seat, etc.) 0 INTERVENTIONS - Wound Cleansing / Measurement []  - Simple Wound Cleansing - one wound 0 X - Complex Wound Cleansing - multiple wounds 2 5 X -  Wound Imaging (photographs - any number of wounds) 1 5 []  - Wound Tracing (instead of photographs) 0 []  - Simple Wound Measurement - one wound 0 X - Complex Wound Measurement - multiple wounds 2 5 INTERVENTIONS - Wound Dressings []  - Small Wound Dressing one or multiple wounds 0 X - Medium Wound Dressing one or multiple wounds 2 15 []  - Large Wound Dressing one or multiple wounds 0 X - Application of Medications - topical 1 5 []  - Application of Medications - injection 0 INTERVENTIONS - Miscellaneous []  - External ear exam 0 []  - Specimen Collection (cultures, biopsies, blood, body fluids, etc.) 0 []  - Specimen(s) / Culture(s) sent or taken to Lab for analysis 0 []  - Patient Transfer (multiple staff / Civil Service fast streamer / Similar devices) 0 []  - Simple Staple / Suture removal (25 or less) 0 []  - Complex Staple / Suture removal (26 or more) 0 []  - Hypo / Hyperglycemic Management (close monitor of Blood Glucose) 0 []  - Ankle / Brachial Index (ABI) - do not check if billed separately 0 X - Vital Signs 1 5 Has the patient been seen at the hospital within the last three years: Yes Total Score: 125 Level Of Care: New/Established - Level 4 Electronic Signature(s) Signed: 07/17/2019 5:19:34 PM By: Carlene Coria RN Entered By: Carlene Coria on 07/17/2019 14:35:13 -------------------------------------------------------------------------------- Encounter Discharge Information Details Patient Name: Date of Service: Holly Banks, Holly Banks 07/17/2019 1:15 PM Medical Record [id number] Patient Account Number: [id number] Date of Birth/Sex: Treating RN: [birth year] (86 y.o. Female) Baruch Gouty Primary Care Jannett Schmall: Cassandria Anger Other Clinician: Referring Michiah Mudry: Treating Telesa Jeancharles/Extender:Robson, Letitia Neri,  Vivi Ferns in Treatment: 2 Encounter Discharge Information Items Discharge Condition: Stable Ambulatory Status: Wheelchair Discharge Destination: Home Transportation: Private Auto Accompanied By: daughter Schedule Follow-up Appointment: Yes Clinical Summary of Care: Patient Declined Electronic Signature(s) Signed: 07/17/2019 5:18:10 PM By: Baruch Gouty RN, BSN Entered By: Baruch Gouty on 07/17/2019 15:04:49 -------------------------------------------------------------------------------- Lower Extremity Assessment Details Patient Name: Date of Service: Holly Banks, Holly Banks 07/17/2019 1:15 PM Medical Record [id number] Patient Account Number: [id number] Date of Birth/Sex: Treating RN: [birth year] (86 y.o. Female) Levan Hurst Primary Care Mikenzie Mccannon: Cassandria Anger Other Clinician: Referring Swain Acree: Treating Marcos Ruelas/Extender:Robson, Letitia Neri, Evie Lacks Weeks in Treatment: 2 Edema Assessment Assessed: [Left: No] [Right: No] Edema: [Left: N] [Right: o] V[Left: ascular Assessmen] [Right: t] Left: [Left: Right] [Right: :] P[Left: ulses] [Right: :] [Left: Dorsalis Pedi] [Right: s] Palpable: [Left: Ye] [Right: s] Electronic Signature(s) Signed: 07/17/2019 5:30:42 PM By: Levan Hurst RN, BSN Entered By: Levan Hurst on 07/17/2019 14:15:59 -------------------------------------------------------------------------------- Multi Wound Chart Details Patient Name: Date of Service: Holly Banks, Holly Banks 07/17/2019 1:15 PM Medical Record [id number] Patient Account Number: [id number] Date of Birth/Sex: Treating RN: April 08, 1929 (86 y.o. Female) Carlene Coria Primary Care Staley Lunz: Cassandria Anger Other Clinician: Referring Annesha Delgreco: Treating Veto Macqueen/Extender:Robson, Letitia Neri, Vivi Ferns in Treatment: 2 Vital Signs Height(in): 72 Pulse(bpm): 88 Weight(lbs): 210 Blood Pressure(mmHg): 151/53 Body Mass Index(BMI): 38 Temperature(F):  98.2 Respiratory 18 Rate(breaths/min): Photos: [1:No Photos] [2:No Photos] [N/A:N/A] Wound Location: [1:Sacrum] [2:Right Calcaneus] [N/A:N/A] Wounding Event: [1:Pressure Injury] [2:Pressure Injury] [N/A:N/A] Primary Etiology: [1:Pressure Ulcer] [2:Pressure Ulcer] [N/A:N/A] Secondary Etiology: [1:N/A] [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] Comorbid History: [1:Hypertension, Type II Diabetes, Gout, Osteoarthritis, Neuropathy Osteoarthritis, Neuropathy] [2:Hypertension, Type II Diabetes, Gout,] [N/A:N/A] Date Acquired: [1:05/08/2019] [2:07/11/2019] [N/A:N/A] Weeks of Treatment: [1:2] [2:0] [N/A:N/A] Wound Status: [1:Open] [2:Open] [N/A:N/A] Measurements L x W x D 1.6x2.1x1.4 [2:0.5x0.5x0.1] [N/A:N/A] (cm) Area (cm) : [1:2.639] [2:0.196] [N/A:N/A] Volume (cm) : [  1:3.695] [2:0.02] [N/A:N/A] % Reduction in Area: [1:55.20%] [2:N/A] [N/A:N/A] % Reduction in Volume: 75.90% [2:N/A] [N/A:N/A] Starting Position 1 10 (o'clock): Ending Position 1 [1:3] (o'clock): Maximum Distance 1 [1:1.1] (cm): Undermining: [1:Yes] [2:No] [N/A:N/A] Classification: [1:Unstageable/Unclassified Category/Stage II] [N/A:N/A] Exudate Amount: [1:Medium] [2:Small] [N/A:N/A] Exudate Type: [1:Serosanguineous] [2:Serosanguineous] [N/A:N/A] Exudate Color: [1:red, brown] [2:red, brown] [N/A:N/A] Wound Margin: [1:Well defined, not attached Flat and Intact] [N/A:N/A] Granulation Amount: [1:Large (67-100%)] [2:Large (67-100%)] [N/A:N/A] Granulation Quality: [1:Red] [2:Pink] [N/A:N/A] Necrotic Amount: [1:Small (1-33%)] [2:None Present (0%)] [N/A:N/A] Exposed Structures: [1:Fat Layer (Subcutaneous Fat Layer (Subcutaneous N/A Tissue) Exposed: Yes Fascia: No Tendon: No Muscle: No Joint: No Bone: No Small (1-33%)] [2:Tissue) Exposed: Yes Fascia: No Tendon: No Muscle: No Joint: No Bone: No None] Treatment Notes Electronic Signature(s) Signed: 07/17/2019 5:08:08 PM By: Linton Ham MD Signed: 07/17/2019 5:19:34 PM By:  Carlene Coria RN Entered By: Linton Ham on 07/17/2019 14:51:20 -------------------------------------------------------------------------------- Multi-Disciplinary Care Plan Details Patient Name: Date of Service: Holly Banks, Holly Banks 07/17/2019 1:15 PM Medical Record [id number] Patient Account Number: [id number] Date of Birth/Sex: Treating RN: 02/07/1929 (86 y.o. Female) Carlene Coria Primary Care Patric Vanpelt: Cassandria Anger Other Clinician: Referring Gardiner Espana: Treating Lavert Matousek/Extender:Robson, Letitia Neri, Vivi Ferns in Treatment: 2 Active Inactive Wound/Skin Impairment Nursing Diagnoses: Knowledge deficit related to ulceration/compromised skin integrity Goals: Patient/caregiver will verbalize understanding of skin care regimen Date Initiated: 06/28/2019 Target Resolution Date: 08/10/2019 Goal Status: Active Interventions: Assess ulceration(s) every visit Provide education on ulcer and skin care Treatment Activities: Skin care regimen initiated : 06/28/2019 Topical wound management initiated : 06/28/2019 Notes: Electronic Signature(s) Signed: 07/17/2019 5:19:34 PM By: Carlene Coria RN Entered By: Carlene Coria on 07/17/2019 13:25:37 -------------------------------------------------------------------------------- Pain Assessment Details Patient Name: Date of Service: Holly Banks, Holly Banks 07/17/2019 1:15 PM Medical Record [id number] Patient Account Number: [id number] Date of Birth/Sex: Treating RN: 04-05-1929 (86 y.o. Female) Levan Hurst Primary Care Simra Fiebig: Cassandria Anger Other Clinician: Referring Joon Pohle: Treating Belem Hintze/Extender:Robson, Letitia Neri, Vivi Ferns in Treatment: 2 Active Problems Location of Pain Severity and Description of Pain Patient Has Paino No Site Locations Pain Management and Medication Current Pain Management: Electronic Signature(s) Signed: 07/17/2019 5:30:42 PM By: Levan Hurst RN, BSN Entered By: Levan Hurst on 07/17/2019 14:12:27 -------------------------------------------------------------------------------- Patient/Caregiver Education Details Patient Name: Holly Banks 2/9/2021andnbsp1:15 Date of Service: PM Medical Record [id number] Number: Patient Account Number: [id number] Treating RN: Date of Birth/Gender: 1929-04-22 (86 y.o. Carlene Coria Female) Other Clinician: Primary Care Treating Plotnikov, Donney Rankins Physician: Physician/Extender: Referring Physician: Charlann Noss in Treatment: 2 Education Assessment Education Provided To: Patient Education Topics Provided Wound/Skin Impairment: Methods: Explain/Verbal Responses: State content correctly Electronic Signature(s) Signed: 07/17/2019 5:19:34 PM By: Carlene Coria RN Entered By: Carlene Coria on 07/17/2019 13:25:52 -------------------------------------------------------------------------------- Wound Assessment Details Patient Name: Date of Service: Holly Banks, Holly Banks 07/17/2019 1:15 PM Medical Record [id number] Patient Account Number: [id number] Date of Birth/Sex: Treating RN: 02/10/1929 (86 y.o. Female) Carlene Coria Primary Care Dago Jungwirth: Cassandria Anger Other Clinician: Referring Zuri Bradway: Treating Mckena Chern/Extender:Robson, Letitia Neri, Vivi Ferns in Treatment: 2 Wound Status Wound Number: 1 Primary Pressure Ulcer Etiology: Wound Location: Sacrum Wound Open Wounding Event: Pressure Injury Status: Date Acquired: 05/08/2019 Comorbid Hypertension, Type II Diabetes, Gout, Weeks Of Treatment: 2 History: Osteoarthritis, Neuropathy Clustered Wound: No Photos Wound Measurements Length: (cm) 1.6 Width: (cm) 2.1 Depth: (cm) 1.4 Area: (cm) 2.639 Volume: (cm) 3.695 Wound Description Classification: Unstageable/Unclassified Wound Margin: Well defined, not attached Exudate Amount: Medium Exudate Type: Serosanguineous Exudate Color: red, brown Wound  Bed Granulation Amount: Large (67-100%) Granulation  Quality: Red Necrotic Amount: Small (1-33%) Necrotic Quality: Adherent Slough or After Cleansing: No Fibrino Yes Exposed Structure xposed: No r (Subcutaneous Tissue) Exposed: Yes xposed: No xposed: No posed: No osed: No % Reduction in Area: 55.2% % Reduction in Volume: 75.9% Epithelialization: Small (1-33%) Tunneling: No Undermining: Yes Starting Position (o'clock): 10 Ending Position (o'clock): 3 Maximum Distance: (cm) 1.1 Foul Od Slough/ Fascia E Fat Laye Tendon E Muscle E Joint Ex Bone Exp Treatment Notes Wound #1 (Sacrum) 2. Periwound Care Skin Prep 3. Primary Dressing Applied Collagen 4. Secondary Dressing Other secondary dressing (specify in notes) Foam Border Dressing Notes saline moistened gauze Electronic Signature(s) Signed: 07/18/2019 4:31:42 PM By: Mikeal Hawthorne EMT/HBOT Signed: 07/18/2019 4:53:37 PM By: Carlene Coria RN Previous Signature: 07/17/2019 5:30:42 PM Version By: Levan Hurst RN, BSN Entered By: Mikeal Hawthorne on 07/18/2019 14:46:39 -------------------------------------------------------------------------------- Wound Assessment Details Patient Name: Date of Service: Holly Banks, Holly Banks 07/17/2019 1:15 PM Medical Record [id number] Patient Account Number: [id number] Date of Birth/Sex: Treating RN: 1928-09-30 (86 y.o. Female) Epps, Morey Hummingbird Primary Care Gregoire Bennis: Cassandria Anger Other Clinician: Referring Ryott Rafferty: Treating Sherrine Salberg/Extender:Robson, Letitia Neri, Vivi Ferns in Treatment: 2 Wound Status Wound Number: 2 Primary Pressure Ulcer Etiology: Wound Location: Right Calcaneus Secondary Diabetic Wound/Ulcer of the Lower Wounding Event: Pressure Injury Etiology: Extremity Date Acquired: 07/11/2019 Wound Open Weeks Of Treatment: 0 Status: Clustered Wound: No Comorbid Hypertension, Type II Diabetes, Gout, History: Osteoarthritis, Neuropathy Photos Wound  Measurements Length: (cm) 0.5 Width: (cm) 0.5 Depth: (cm) 0.1 Area: (cm) 0.196 Volume: (cm) 0.02 Wound Description Classification: Category/Stage II Wound Margin: Flat and Intact Exudate Amount: Small Exudate Type: Serosanguineous Exudate Color: red, brown Wound Bed Granulation Amount: Large (67-100%) Granulation Quality: Pink Necrotic Amount: None Present (0%) After Cleansing: No brino No Exposed Structure osed: No (Subcutaneous Tissue) Exposed: Yes osed: No osed: No sed: No ed: No % Reduction in Area: 0% % Reduction in Volume: 0% Epithelialization: None Tunneling: No Undermining: No Foul Odor Slough/Fi Fascia Exp Fat Layer Tendon Exp Muscle Exp Joint Expo Bone Expos Treatment Notes Wound #2 (Right Calcaneus) 2. Periwound Care Skin Prep 3. Primary Dressing Applied Collagen 4. Secondary Dressing Foam Border Dressing Electronic Signature(s) Signed: 07/18/2019 4:31:42 PM By: Mikeal Hawthorne EMT/HBOT Signed: 07/18/2019 4:53:37 PM By: Carlene Coria RN Previous Signature: 07/17/2019 5:30:42 PM Version By: Levan Hurst RN, BSN Entered By: Mikeal Hawthorne on 07/18/2019 14:46:13 -------------------------------------------------------------------------------- [place] Details Patient Name: Date of Service: Holly Banks, Holly Banks 07/17/2019 1:15 PM Medical Record [id number] Patient Account Number: [id number] Date of Birth/Sex: Treating RN: 1928/10/31 (86 y.o. Female) Levan Hurst Primary Care Armonii Sieh: Cassandria Anger Other Clinician: Referring Mykaela Arena: Treating Cyree Chuong/Extender:Robson, Letitia Neri, Vivi Ferns in Treatment: 2 Vital Signs Time Taken: 14:12 Temperature (F): 98.2 Height (in): 62 Pulse (bpm): 61 Weight (lbs): 210 Respiratory Rate (breaths/min): 18 Body Mass Index (BMI): 38.4 Blood Pressure (mmHg): 151/53 Reference Range: 80 - 120 mg / dl Electronic Signature(s) Signed: 07/17/2019 5:30:42 PM By: Levan Hurst RN, BSN Entered  By: Levan Hurst on 07/17/2019 14:12:22

## 2019-07-18 DIAGNOSIS — L89150 Pressure ulcer of sacral region, unstageable: Secondary | ICD-10-CM | POA: Diagnosis not present

## 2019-07-18 DIAGNOSIS — N1832 Chronic kidney disease, stage 3b: Secondary | ICD-10-CM | POA: Diagnosis not present

## 2019-07-18 DIAGNOSIS — L89322 Pressure ulcer of left buttock, stage 2: Secondary | ICD-10-CM | POA: Diagnosis not present

## 2019-07-18 DIAGNOSIS — E1122 Type 2 diabetes mellitus with diabetic chronic kidney disease: Secondary | ICD-10-CM | POA: Diagnosis not present

## 2019-07-18 DIAGNOSIS — E559 Vitamin D deficiency, unspecified: Secondary | ICD-10-CM | POA: Diagnosis not present

## 2019-07-18 DIAGNOSIS — M549 Dorsalgia, unspecified: Secondary | ICD-10-CM | POA: Diagnosis not present

## 2019-07-18 DIAGNOSIS — I129 Hypertensive chronic kidney disease with stage 1 through stage 4 chronic kidney disease, or unspecified chronic kidney disease: Secondary | ICD-10-CM | POA: Diagnosis not present

## 2019-07-18 DIAGNOSIS — E538 Deficiency of other specified B group vitamins: Secondary | ICD-10-CM | POA: Diagnosis not present

## 2019-07-18 DIAGNOSIS — R627 Adult failure to thrive: Secondary | ICD-10-CM | POA: Diagnosis not present

## 2019-07-18 DIAGNOSIS — E669 Obesity, unspecified: Secondary | ICD-10-CM | POA: Diagnosis not present

## 2019-07-19 ENCOUNTER — Encounter (HOSPITAL_BASED_OUTPATIENT_CLINIC_OR_DEPARTMENT_OTHER): Payer: Medicare HMO | Admitting: Internal Medicine

## 2019-07-20 DIAGNOSIS — E559 Vitamin D deficiency, unspecified: Secondary | ICD-10-CM | POA: Diagnosis not present

## 2019-07-20 DIAGNOSIS — E669 Obesity, unspecified: Secondary | ICD-10-CM | POA: Diagnosis not present

## 2019-07-20 DIAGNOSIS — N1832 Chronic kidney disease, stage 3b: Secondary | ICD-10-CM | POA: Diagnosis not present

## 2019-07-20 DIAGNOSIS — I129 Hypertensive chronic kidney disease with stage 1 through stage 4 chronic kidney disease, or unspecified chronic kidney disease: Secondary | ICD-10-CM | POA: Diagnosis not present

## 2019-07-20 DIAGNOSIS — E1122 Type 2 diabetes mellitus with diabetic chronic kidney disease: Secondary | ICD-10-CM | POA: Diagnosis not present

## 2019-07-20 DIAGNOSIS — R627 Adult failure to thrive: Secondary | ICD-10-CM | POA: Diagnosis not present

## 2019-07-20 DIAGNOSIS — M549 Dorsalgia, unspecified: Secondary | ICD-10-CM | POA: Diagnosis not present

## 2019-07-20 DIAGNOSIS — L89322 Pressure ulcer of left buttock, stage 2: Secondary | ICD-10-CM | POA: Diagnosis not present

## 2019-07-20 DIAGNOSIS — E538 Deficiency of other specified B group vitamins: Secondary | ICD-10-CM | POA: Diagnosis not present

## 2019-07-22 DIAGNOSIS — R531 Weakness: Secondary | ICD-10-CM | POA: Diagnosis not present

## 2019-07-22 DIAGNOSIS — M1990 Unspecified osteoarthritis, unspecified site: Secondary | ICD-10-CM | POA: Diagnosis not present

## 2019-07-22 DIAGNOSIS — E1140 Type 2 diabetes mellitus with diabetic neuropathy, unspecified: Secondary | ICD-10-CM | POA: Diagnosis not present

## 2019-07-22 DIAGNOSIS — R269 Unspecified abnormalities of gait and mobility: Secondary | ICD-10-CM | POA: Diagnosis not present

## 2019-07-22 DIAGNOSIS — N39498 Other specified urinary incontinence: Secondary | ICD-10-CM | POA: Diagnosis not present

## 2019-07-22 DIAGNOSIS — L89322 Pressure ulcer of left buttock, stage 2: Secondary | ICD-10-CM | POA: Diagnosis not present

## 2019-07-23 DIAGNOSIS — L89322 Pressure ulcer of left buttock, stage 2: Secondary | ICD-10-CM | POA: Diagnosis not present

## 2019-07-23 DIAGNOSIS — E669 Obesity, unspecified: Secondary | ICD-10-CM | POA: Diagnosis not present

## 2019-07-23 DIAGNOSIS — R627 Adult failure to thrive: Secondary | ICD-10-CM | POA: Diagnosis not present

## 2019-07-23 DIAGNOSIS — I129 Hypertensive chronic kidney disease with stage 1 through stage 4 chronic kidney disease, or unspecified chronic kidney disease: Secondary | ICD-10-CM | POA: Diagnosis not present

## 2019-07-23 DIAGNOSIS — N1832 Chronic kidney disease, stage 3b: Secondary | ICD-10-CM | POA: Diagnosis not present

## 2019-07-23 DIAGNOSIS — E538 Deficiency of other specified B group vitamins: Secondary | ICD-10-CM | POA: Diagnosis not present

## 2019-07-23 DIAGNOSIS — E559 Vitamin D deficiency, unspecified: Secondary | ICD-10-CM | POA: Diagnosis not present

## 2019-07-23 DIAGNOSIS — E1122 Type 2 diabetes mellitus with diabetic chronic kidney disease: Secondary | ICD-10-CM | POA: Diagnosis not present

## 2019-07-23 DIAGNOSIS — M549 Dorsalgia, unspecified: Secondary | ICD-10-CM | POA: Diagnosis not present

## 2019-07-25 DIAGNOSIS — R627 Adult failure to thrive: Secondary | ICD-10-CM | POA: Diagnosis not present

## 2019-07-25 DIAGNOSIS — E1122 Type 2 diabetes mellitus with diabetic chronic kidney disease: Secondary | ICD-10-CM | POA: Diagnosis not present

## 2019-07-25 DIAGNOSIS — E559 Vitamin D deficiency, unspecified: Secondary | ICD-10-CM | POA: Diagnosis not present

## 2019-07-25 DIAGNOSIS — E669 Obesity, unspecified: Secondary | ICD-10-CM | POA: Diagnosis not present

## 2019-07-25 DIAGNOSIS — M549 Dorsalgia, unspecified: Secondary | ICD-10-CM | POA: Diagnosis not present

## 2019-07-25 DIAGNOSIS — I129 Hypertensive chronic kidney disease with stage 1 through stage 4 chronic kidney disease, or unspecified chronic kidney disease: Secondary | ICD-10-CM | POA: Diagnosis not present

## 2019-07-25 DIAGNOSIS — E538 Deficiency of other specified B group vitamins: Secondary | ICD-10-CM | POA: Diagnosis not present

## 2019-07-25 DIAGNOSIS — L89322 Pressure ulcer of left buttock, stage 2: Secondary | ICD-10-CM | POA: Diagnosis not present

## 2019-07-25 DIAGNOSIS — N1832 Chronic kidney disease, stage 3b: Secondary | ICD-10-CM | POA: Diagnosis not present

## 2019-07-26 DIAGNOSIS — L89150 Pressure ulcer of sacral region, unstageable: Secondary | ICD-10-CM | POA: Diagnosis not present

## 2019-07-26 DIAGNOSIS — E1122 Type 2 diabetes mellitus with diabetic chronic kidney disease: Secondary | ICD-10-CM | POA: Diagnosis not present

## 2019-07-26 DIAGNOSIS — N1832 Chronic kidney disease, stage 3b: Secondary | ICD-10-CM | POA: Diagnosis not present

## 2019-07-26 DIAGNOSIS — I129 Hypertensive chronic kidney disease with stage 1 through stage 4 chronic kidney disease, or unspecified chronic kidney disease: Secondary | ICD-10-CM | POA: Diagnosis not present

## 2019-07-26 DIAGNOSIS — R627 Adult failure to thrive: Secondary | ICD-10-CM | POA: Diagnosis not present

## 2019-07-26 DIAGNOSIS — E538 Deficiency of other specified B group vitamins: Secondary | ICD-10-CM | POA: Diagnosis not present

## 2019-07-26 DIAGNOSIS — E559 Vitamin D deficiency, unspecified: Secondary | ICD-10-CM | POA: Diagnosis not present

## 2019-07-26 DIAGNOSIS — E669 Obesity, unspecified: Secondary | ICD-10-CM | POA: Diagnosis not present

## 2019-07-26 DIAGNOSIS — M549 Dorsalgia, unspecified: Secondary | ICD-10-CM | POA: Diagnosis not present

## 2019-07-27 DIAGNOSIS — E538 Deficiency of other specified B group vitamins: Secondary | ICD-10-CM | POA: Diagnosis not present

## 2019-07-27 DIAGNOSIS — R627 Adult failure to thrive: Secondary | ICD-10-CM | POA: Diagnosis not present

## 2019-07-27 DIAGNOSIS — E559 Vitamin D deficiency, unspecified: Secondary | ICD-10-CM | POA: Diagnosis not present

## 2019-07-27 DIAGNOSIS — M549 Dorsalgia, unspecified: Secondary | ICD-10-CM | POA: Diagnosis not present

## 2019-07-27 DIAGNOSIS — L89150 Pressure ulcer of sacral region, unstageable: Secondary | ICD-10-CM | POA: Diagnosis not present

## 2019-07-27 DIAGNOSIS — N1832 Chronic kidney disease, stage 3b: Secondary | ICD-10-CM | POA: Diagnosis not present

## 2019-07-27 DIAGNOSIS — E669 Obesity, unspecified: Secondary | ICD-10-CM | POA: Diagnosis not present

## 2019-07-27 DIAGNOSIS — E1122 Type 2 diabetes mellitus with diabetic chronic kidney disease: Secondary | ICD-10-CM | POA: Diagnosis not present

## 2019-07-27 DIAGNOSIS — I129 Hypertensive chronic kidney disease with stage 1 through stage 4 chronic kidney disease, or unspecified chronic kidney disease: Secondary | ICD-10-CM | POA: Diagnosis not present

## 2019-07-30 DIAGNOSIS — E669 Obesity, unspecified: Secondary | ICD-10-CM | POA: Diagnosis not present

## 2019-07-30 DIAGNOSIS — M549 Dorsalgia, unspecified: Secondary | ICD-10-CM | POA: Diagnosis not present

## 2019-07-30 DIAGNOSIS — N1832 Chronic kidney disease, stage 3b: Secondary | ICD-10-CM | POA: Diagnosis not present

## 2019-07-30 DIAGNOSIS — R627 Adult failure to thrive: Secondary | ICD-10-CM | POA: Diagnosis not present

## 2019-07-30 DIAGNOSIS — E1122 Type 2 diabetes mellitus with diabetic chronic kidney disease: Secondary | ICD-10-CM | POA: Diagnosis not present

## 2019-07-30 DIAGNOSIS — E538 Deficiency of other specified B group vitamins: Secondary | ICD-10-CM | POA: Diagnosis not present

## 2019-07-30 DIAGNOSIS — I129 Hypertensive chronic kidney disease with stage 1 through stage 4 chronic kidney disease, or unspecified chronic kidney disease: Secondary | ICD-10-CM | POA: Diagnosis not present

## 2019-07-30 DIAGNOSIS — L89150 Pressure ulcer of sacral region, unstageable: Secondary | ICD-10-CM | POA: Diagnosis not present

## 2019-07-30 DIAGNOSIS — E559 Vitamin D deficiency, unspecified: Secondary | ICD-10-CM | POA: Diagnosis not present

## 2019-07-31 ENCOUNTER — Ambulatory Visit: Payer: Medicare HMO | Admitting: Internal Medicine

## 2019-08-01 DIAGNOSIS — R627 Adult failure to thrive: Secondary | ICD-10-CM | POA: Diagnosis not present

## 2019-08-01 DIAGNOSIS — N1832 Chronic kidney disease, stage 3b: Secondary | ICD-10-CM | POA: Diagnosis not present

## 2019-08-01 DIAGNOSIS — E559 Vitamin D deficiency, unspecified: Secondary | ICD-10-CM | POA: Diagnosis not present

## 2019-08-01 DIAGNOSIS — E538 Deficiency of other specified B group vitamins: Secondary | ICD-10-CM | POA: Diagnosis not present

## 2019-08-01 DIAGNOSIS — M549 Dorsalgia, unspecified: Secondary | ICD-10-CM | POA: Diagnosis not present

## 2019-08-01 DIAGNOSIS — I129 Hypertensive chronic kidney disease with stage 1 through stage 4 chronic kidney disease, or unspecified chronic kidney disease: Secondary | ICD-10-CM | POA: Diagnosis not present

## 2019-08-01 DIAGNOSIS — L89150 Pressure ulcer of sacral region, unstageable: Secondary | ICD-10-CM | POA: Diagnosis not present

## 2019-08-01 DIAGNOSIS — E1122 Type 2 diabetes mellitus with diabetic chronic kidney disease: Secondary | ICD-10-CM | POA: Diagnosis not present

## 2019-08-01 DIAGNOSIS — E669 Obesity, unspecified: Secondary | ICD-10-CM | POA: Diagnosis not present

## 2019-08-02 ENCOUNTER — Telehealth: Payer: Self-pay | Admitting: Internal Medicine

## 2019-08-02 NOTE — Telephone Encounter (Signed)
They are refaxing orders

## 2019-08-02 NOTE — Telephone Encounter (Signed)
New message:   Holly Banks is calling from [place] and inquiring about some outstanding orders from 12/28 she states it was faxed and never received a response back. Please advise

## 2019-08-03 DIAGNOSIS — R627 Adult failure to thrive: Secondary | ICD-10-CM | POA: Diagnosis not present

## 2019-08-03 DIAGNOSIS — E538 Deficiency of other specified B group vitamins: Secondary | ICD-10-CM | POA: Diagnosis not present

## 2019-08-03 DIAGNOSIS — M549 Dorsalgia, unspecified: Secondary | ICD-10-CM | POA: Diagnosis not present

## 2019-08-03 DIAGNOSIS — E669 Obesity, unspecified: Secondary | ICD-10-CM | POA: Diagnosis not present

## 2019-08-03 DIAGNOSIS — I129 Hypertensive chronic kidney disease with stage 1 through stage 4 chronic kidney disease, or unspecified chronic kidney disease: Secondary | ICD-10-CM | POA: Diagnosis not present

## 2019-08-03 DIAGNOSIS — E559 Vitamin D deficiency, unspecified: Secondary | ICD-10-CM | POA: Diagnosis not present

## 2019-08-03 DIAGNOSIS — E1122 Type 2 diabetes mellitus with diabetic chronic kidney disease: Secondary | ICD-10-CM | POA: Diagnosis not present

## 2019-08-03 DIAGNOSIS — L89150 Pressure ulcer of sacral region, unstageable: Secondary | ICD-10-CM | POA: Diagnosis not present

## 2019-08-03 DIAGNOSIS — N1832 Chronic kidney disease, stage 3b: Secondary | ICD-10-CM | POA: Diagnosis not present

## 2019-08-06 DIAGNOSIS — M549 Dorsalgia, unspecified: Secondary | ICD-10-CM | POA: Diagnosis not present

## 2019-08-06 DIAGNOSIS — E669 Obesity, unspecified: Secondary | ICD-10-CM | POA: Diagnosis not present

## 2019-08-06 DIAGNOSIS — R627 Adult failure to thrive: Secondary | ICD-10-CM | POA: Diagnosis not present

## 2019-08-06 DIAGNOSIS — E559 Vitamin D deficiency, unspecified: Secondary | ICD-10-CM | POA: Diagnosis not present

## 2019-08-06 DIAGNOSIS — E1122 Type 2 diabetes mellitus with diabetic chronic kidney disease: Secondary | ICD-10-CM | POA: Diagnosis not present

## 2019-08-06 DIAGNOSIS — E538 Deficiency of other specified B group vitamins: Secondary | ICD-10-CM | POA: Diagnosis not present

## 2019-08-06 DIAGNOSIS — N1832 Chronic kidney disease, stage 3b: Secondary | ICD-10-CM | POA: Diagnosis not present

## 2019-08-06 DIAGNOSIS — I129 Hypertensive chronic kidney disease with stage 1 through stage 4 chronic kidney disease, or unspecified chronic kidney disease: Secondary | ICD-10-CM | POA: Diagnosis not present

## 2019-08-06 DIAGNOSIS — L89150 Pressure ulcer of sacral region, unstageable: Secondary | ICD-10-CM | POA: Diagnosis not present

## 2019-08-07 ENCOUNTER — Encounter (HOSPITAL_BASED_OUTPATIENT_CLINIC_OR_DEPARTMENT_OTHER): Payer: Medicare HMO | Admitting: Internal Medicine

## 2019-08-08 ENCOUNTER — Other Ambulatory Visit: Payer: Self-pay

## 2019-08-08 ENCOUNTER — Encounter: Payer: Self-pay | Admitting: Podiatry

## 2019-08-08 ENCOUNTER — Ambulatory Visit (INDEPENDENT_AMBULATORY_CARE_PROVIDER_SITE_OTHER): Payer: Medicare HMO | Admitting: Podiatry

## 2019-08-08 DIAGNOSIS — I129 Hypertensive chronic kidney disease with stage 1 through stage 4 chronic kidney disease, or unspecified chronic kidney disease: Secondary | ICD-10-CM | POA: Diagnosis not present

## 2019-08-08 DIAGNOSIS — M549 Dorsalgia, unspecified: Secondary | ICD-10-CM | POA: Diagnosis not present

## 2019-08-08 DIAGNOSIS — E538 Deficiency of other specified B group vitamins: Secondary | ICD-10-CM | POA: Diagnosis not present

## 2019-08-08 DIAGNOSIS — E1142 Type 2 diabetes mellitus with diabetic polyneuropathy: Secondary | ICD-10-CM | POA: Diagnosis not present

## 2019-08-08 DIAGNOSIS — M79674 Pain in right toe(s): Secondary | ICD-10-CM

## 2019-08-08 DIAGNOSIS — N1832 Chronic kidney disease, stage 3b: Secondary | ICD-10-CM | POA: Diagnosis not present

## 2019-08-08 DIAGNOSIS — L89150 Pressure ulcer of sacral region, unstageable: Secondary | ICD-10-CM | POA: Diagnosis not present

## 2019-08-08 DIAGNOSIS — E1122 Type 2 diabetes mellitus with diabetic chronic kidney disease: Secondary | ICD-10-CM | POA: Diagnosis not present

## 2019-08-08 DIAGNOSIS — E559 Vitamin D deficiency, unspecified: Secondary | ICD-10-CM | POA: Diagnosis not present

## 2019-08-08 DIAGNOSIS — B351 Tinea unguium: Secondary | ICD-10-CM | POA: Diagnosis not present

## 2019-08-08 DIAGNOSIS — M79675 Pain in left toe(s): Secondary | ICD-10-CM | POA: Diagnosis not present

## 2019-08-08 DIAGNOSIS — R627 Adult failure to thrive: Secondary | ICD-10-CM | POA: Diagnosis not present

## 2019-08-08 DIAGNOSIS — E669 Obesity, unspecified: Secondary | ICD-10-CM | POA: Diagnosis not present

## 2019-08-08 NOTE — Progress Notes (Signed)
Complaint:  Visit Type: Patient returns to my office for continued preventative foot care services. Complaint: Patient states" my nails have grown long and thick and become painful to walk and wear shoes" Patient has been diagnosed with DM with no foot complications. The patient presents for preventative foot care services.  She presents to the office in a wheelchair.  Podiatric Exam: Vascular: dorsalis pedis and posterior tibial pulses are not  palpable bilateral. Capillary return is immediate. Temperature gradient is WNL. Skin turgor WNL  Sensorium: Diminished  Semmes Weinstein monofilament test. Diminished. tactile sensation bilaterally. Nail Exam: Pt has thick disfigured discolored nails with subungual debris noted bilateral entire nail hallux through fifth toenails Ulcer Exam: There is no evidence of ulcer or pre-ulcerative changes or infection. Orthopedic Exam: Muscle tone and strength are WNL. No limitations in general ROM. No crepitus or effusions noted. Foot type and digits show no abnormalities. Bony prominences are unremarkable. Skin: No Porokeratosis. No infection or ulcers  Diagnosis:  Onychomycosis, , Pain in right toe, pain in left toes  Diabetes with angiopathy and neuropathy.  Treatment & Plan Procedures and Treatment: Consent by patient was obtained for treatment procedures.   Debridement of mycotic and hypertrophic toenails, 1 through 5 bilateral and clearing of subungual debris. No ulceration, no infection noted.  Return Visit-Office Procedure: Patient instructed to return to the office for a follow up visit 6 months for continued evaluation and treatment.    Gardiner Barefoot DPM

## 2019-08-09 ENCOUNTER — Encounter (HOSPITAL_BASED_OUTPATIENT_CLINIC_OR_DEPARTMENT_OTHER): Payer: Medicare HMO | Attending: Internal Medicine | Admitting: Internal Medicine

## 2019-08-09 DIAGNOSIS — H5461 Unqualified visual loss, right eye, normal vision left eye: Secondary | ICD-10-CM | POA: Insufficient documentation

## 2019-08-09 DIAGNOSIS — I10 Essential (primary) hypertension: Secondary | ICD-10-CM | POA: Diagnosis not present

## 2019-08-09 DIAGNOSIS — R2689 Other abnormalities of gait and mobility: Secondary | ICD-10-CM | POA: Insufficient documentation

## 2019-08-09 DIAGNOSIS — L89150 Pressure ulcer of sacral region, unstageable: Secondary | ICD-10-CM | POA: Diagnosis not present

## 2019-08-09 DIAGNOSIS — E1142 Type 2 diabetes mellitus with diabetic polyneuropathy: Secondary | ICD-10-CM | POA: Insufficient documentation

## 2019-08-09 DIAGNOSIS — L89612 Pressure ulcer of right heel, stage 2: Secondary | ICD-10-CM | POA: Diagnosis not present

## 2019-08-09 DIAGNOSIS — L89159 Pressure ulcer of sacral region, unspecified stage: Secondary | ICD-10-CM | POA: Diagnosis not present

## 2019-08-09 NOTE — Progress Notes (Signed)
Holly Banks ([id number]) Visit Report for 08/09/2019 HPI Details Patient Name: Date of Service: Holly Banks, Holly Banks 08/09/2019 3:45 PM Medical Record [id number] Patient Account Number: [id number] Date of Birth/Sex: Treating RN: [birth year] (86 y.o. Female) Primary Care Provider: Cassandria Banks Other Clinician: Referring Provider: Treating Provider/Extender:Holly Banks, Holly Banks, Holly Banks in Treatment: 6 History of Present Illness HPI Description: Admission 06/28/2019 This is a frail 86 year old woman who arrives with a 1-1/2 to 56-month history with an area on her lower sacrum. She lives at home relatively immobile secondary to diabetic neuropathy, osteoarthritis and gait and balance issues. She was seen by telemetry health however by her primary doctor in October and was noted to have a left buttock stage II. They were applying Tegaderm to this for a while but that did not help she is now just using dry gauze after cleaning the wound. She apparently is not eating well finds her taste declining. Has a hospital bed but not in over lying mattress which she certainly needs. I am not clear what she has on her wheelchair Past medical history; type 2 diabetes with peripheral neuropathy but a last hemoglobin A1c of 6.7 on 11/30, gout, hypertension, weakness, gait decline, osteoarthritis, blind in the right eye. Notable to have very poor oral intake 2/9; patient arrives with the wound in her sacrum. This apparently has improved via our intake nurses measurements. We have been using Santyl. Notable that she has a very shallow stage II wound on the right heel tip which is a new wound this visit 08/09/2019; this is a patient I admitted to the clinic roughly 6 weeks ago. At that point she had a small area on her lower sacrum and had an area on her right plantar heel. I am not quite sure why she has not been here in so long. She has been using silver collagen. The area on the lower  sacrum is better. The area on the right heel is healed Electronic Signature(s) Signed: 08/09/2019 5:41:03 PM By: Holly Ham MD Entered By: Holly Banks on 08/09/2019 17:36:22 -------------------------------------------------------------------------------- Physical Exam Details Patient Name: Date of Service: Holly Banks, Holly Banks 08/09/2019 3:45 PM Medical Record [id number] Patient Account Number: [id number] Date of Birth/Sex: Treating RN: 1929-01-12 (86 y.o. Female) Primary Care Provider: Cassandria Banks Other Clinician: Referring Provider: Treating Provider/Extender:Holly Banks, Holly Banks, Holly Banks in Treatment: 6 Constitutional Patient is hypertensive.. Pulse regular and within target range for patient.[name] Respirations regular, non-labored and within target range.. Temperature is normal and within the target range for the patient.[name] Appears in no distress. Notes Wound exam; the area in question is on the lower sacrum. This is a small wound but considerable depth at 1.5 cm. I did not appreciate any palpable bone which is an improvement there is no surrounding infection no debridement was necessary On the tip of the right heel this is healed there is no open area here at all. Fully epithelialized Electronic Signature(s) Signed: 08/09/2019 5:41:03 PM By: Holly Ham MD Entered By: Holly Banks on 08/09/2019 17:37:42 -------------------------------------------------------------------------------- Physician Orders Details Patient Name: Date of Service: Holly Banks 08/09/2019 3:45 PM Medical Record [id number] Patient Account Number: [id number] Date of Birth/Sex: Treating RN: 07-14-1928 (86 y.o. Female) Holly Banks Primary Care Provider: Cassandria Banks Other Clinician: Referring Provider: Treating Provider/Extender:Holly Banks, Holly Banks, Holly Banks in Treatment: 6 Verbal / Phone Orders: No Diagnosis Coding ICD-10 Coding Code  Description L89.150 Pressure ulcer of sacral region, unstageable R26.89 Other abnormalities of gait and mobility E11.622 Type 2 diabetes mellitus with  other skin ulcer L89.612 Pressure ulcer of right heel, stage 2 Follow-up Appointments Return appointment in 1 month. Dressing Change Frequency Wound #1 Sacrum Change Dressing every other day. - home health to change three times a week. all other days family member to change. Skin Barriers/Peri-Wound Care Wound #1 Sacrum Skin Prep Wound Cleansing Wound #1 Sacrum Clean wound with Wound Cleanser Primary Wound Dressing Wound #1 Sacrum Collagen - moisten with normal saline or hydrogel Secondary Dressing Wound #1 Sacrum ABD pad - or foam border Off-Loading Low air-loss mattress (Group 2) - once arrives patient to use. Gel wheelchair cushion - continue to use specialty wheelchair cushion. Turn and reposition every 2 hours - use pillow while sitting and laying to offload pressure in chair and bed. Additional Orders / Instructions Follow Nutritious Diet - patient to increase protein and vitamins via food. patient to try to take in more calories may drink ensure or glucerna. [facility]. [facility]. Electronic Signature(s) Signed: 08/09/2019 5:41:03 PM By: Holly Ham MD Signed: 08/09/2019 6:05:23 PM By: Holly Banks Entered By: Holly Banks on 08/09/2019 16:52:47 -------------------------------------------------------------------------------- Problem List Details Patient Name: Date of Service: Holly Banks, Holly Banks 08/09/2019 3:45 PM Medical Record [id number] Patient Account Number: [id number] Date of Birth/Sex: Treating RN: [birth year] (86 y.o. Female) Holly Banks Primary Care Provider: Cassandria Banks Other Clinician: Referring Provider: Treating Provider/Extender:Holly Banks, Holly Banks, Holly Banks in Treatment: 6 Active Problems ICD-10 Evaluated  Encounter Code Description Active Date Today Diagnosis L89.150 Pressure ulcer of sacral region, unstageable 06/28/2019 No Yes R26.89 Other abnormalities of gait and mobility 06/28/2019 No Yes E11.622 Type 2 diabetes mellitus with other skin ulcer 06/28/2019 No Yes L89.612 Pressure ulcer of right heel, stage 2 07/17/2019 No Yes Inactive Problems Resolved Problems Electronic Signature(s) Signed: 08/09/2019 5:41:03 PM By: Holly Ham MD Entered By: Holly Banks on 08/09/2019 17:35:01 -------------------------------------------------------------------------------- Progress Note Details Patient Name: Date of Service: Holly Banks, Holly Banks 08/09/2019 3:45 PM Medical Record [id number] Patient Account Number: [id number] Date of Birth/Sex: Treating RN: 1929-02-09 (86 y.o. Female) Primary Care Provider: Cassandria Banks Other Clinician: Referring Provider: Treating Provider/Extender:Sabryn Preslar, Holly Banks, Holly Banks in Treatment: 6 Subjective History of Present Illness (HPI) Admission 06/28/2019 This is a frail 86 year old woman who arrives with a 1-1/2 to 85-month history with an area on her lower sacrum. She lives at home relatively immobile secondary to diabetic neuropathy, osteoarthritis and gait and balance issues. She was seen by telemetry health however by her primary doctor in October and was noted to have a left buttock stage II. They were applying Tegaderm to this for a while but that did not help she is now just using dry gauze after cleaning the wound. She apparently is not eating well finds her taste declining. Has a hospital bed but not in over lying mattress which she certainly needs. I am not clear what she has on her wheelchair Past medical history; type 2 diabetes with peripheral neuropathy but a last hemoglobin A1c of 6.7 on 11/30, gout, hypertension, weakness, gait decline, osteoarthritis, blind in the right eye. Notable to have very poor oral intake 2/9;  patient arrives with the wound in her sacrum. This apparently has improved via our intake nurses measurements. We have been using Santyl. Notable that she has a very shallow stage II wound on the right heel tip which is a new wound this visit 08/09/2019; this is a patient I admitted to the clinic roughly 6 weeks ago. At that  point she had a small area on her lower sacrum and had an area on her right plantar heel. I am not quite sure why she has not been here in so long. She has been using silver collagen. The area on the lower sacrum is better. The area on the right heel is healed Objective Constitutional Patient is hypertensive.. Pulse regular and within target range for patient.[name] Respirations regular, non-labored and within target range.. Temperature is normal and within the target range for the patient.[name] Appears in no distress. Vitals Time Taken: 4:20 PM, Height: 62 in, Weight: 210 lbs, BMI: 38.4, Temperature: 98.3 F, Pulse: 56 bpm, Respiratory Rate: 18 breaths/min, Blood Pressure: 149/58 mmHg. General Notes: Wound exam; the area in question is on the lower sacrum. This is a small wound but considerable depth at 1.5 cm. I did not appreciate any palpable bone which is an improvement there is no surrounding infection no debridement was necessary ooOn the tip of the right heel this is healed there is no open area here at all. Fully epithelialized Integumentary (Hair, Skin) Wound #1 status is Open. Original cause of wound was Pressure Injury. The wound is located on the Sacrum. The wound measures 1.1cm length x 1.1cm width x 1.2cm depth; 0.95cm^2 area and 1.14cm^3 volume. There is Fat Layer (Subcutaneous Tissue) Exposed exposed. There is no tunneling or undermining noted. There is a medium amount of serosanguineous drainage noted. The wound margin is well defined and not attached to the wound base. There is large (67-100%) red granulation within the wound bed. There is a small (1-33%) amount of  necrotic tissue within the wound bed including Adherent Slough. Wound #2 status is Healed - Epithelialized. Original cause of wound was Pressure Injury. The wound is located on the Right Calcaneus. The wound measures 0cm length x 0cm width x 0cm depth; 0cm^2 area and 0cm^3 volume. There is Fat Layer (Subcutaneous Tissue) Exposed exposed. There is no tunneling or undermining noted. There is a small amount of serosanguineous drainage noted. The wound margin is flat and intact. There is large (67-100%) pink granulation within the wound bed. There is no necrotic tissue within the wound bed. Assessment Active Problems ICD-10 Pressure ulcer of sacral region, unstageable Other abnormalities of gait and mobility Type 2 diabetes mellitus with other skin ulcer Pressure ulcer of right heel, stage 2 Plan Follow-up Appointments: Return appointment in 1 month. Dressing Change Frequency: Wound #1 Sacrum: Change Dressing every other day. - home health to change three times a week. all other days family member to change. Skin Barriers/Peri-Wound Care: Wound #1 Sacrum: Skin Prep Wound Cleansing: Wound #1 Sacrum: Clean wound with Wound Cleanser Primary Wound Dressing: Wound #1 Sacrum: Collagen - moisten with normal saline or hydrogel Secondary Dressing: Wound #1 Sacrum: ABD pad - or foam border Off-Loading: Low air-loss mattress (Group 2) - once arrives patient to use. Gel wheelchair cushion - continue to use specialty wheelchair cushion. Turn and reposition every 2 hours - use pillow while sitting and laying to offload pressure in chair and bed. Additional Orders / Instructions: Follow Nutritious Diet - patient to increase protein and vitamins via food. patient to try to take in more calories may drink ensure or glucerna. Home Health: [facility] for wound care. [facility]. 1. continue with moistened silver collagen to the wound on the sacrum. 2. We have  attempting to get a group to pressure relief surface for the patient. 3. Follow-up 2 weeks. Electronic Signature(s) Signed:  08/09/2019 5:41:03 PM By: Holly Ham MD Entered By: Holly Banks on 08/09/2019 17:38:41 -------------------------------------------------------------------------------- SuperBill Details Patient Name: Date of Service: Holly Banks, Holly Banks 08/09/2019 Medical Record [id number] Patient Account Number: [id number] Date of Birth/Sex: Treating RN: March 21, 1929 (86 y.o. Female) Holly Banks Primary Care Provider: Cassandria Banks Other Clinician: Referring Provider: Treating Provider/Extender:Siyona Coto, Holly Banks, Holly Banks in Treatment: 6 Diagnosis Coding ICD-10 Codes Code Description L89.150 Pressure ulcer of sacral region, unstageable R26.89 Other abnormalities of gait and mobility E11.622 Type 2 diabetes mellitus with other skin ulcer L89.612 Pressure ulcer of right heel, stage 2 Facility Procedures The patient participates with Medicare or their insurance follows the Medicare Facility Guidelines: CPT4 Code Description Modifier Quantity [id number] [place] VISIT-LEV 4 EST PT 1 Physician Procedures Electronic Signature(s) Signed: 08/09/2019 5:41:03 PM By: Holly Ham MD Entered By: Holly Banks on 08/09/2019 17:38:59

## 2019-08-09 NOTE — Progress Notes (Addendum)
Holly, Banks ([id number]) Visit Report for 08/09/2019 Arrival Information Details Patient Name: Date of Service: Holly Banks, Holly Banks 08/09/2019 3:45 PM Medical Record [id number] Patient Account Number: [id number] Date of Birth/Sex: Treating RN: Oct 22, 1928 (86 y.o. Female) Carlene Coria Primary Care Mieczyslaw Stamas: Cassandria Anger Other Clinician: Referring Daysy Santini: Treating Almina Schul/Extender:Robson, Letitia Neri, Vivi Ferns in Treatment: 6 Visit Information History Since Last Visit Wheel Chair All ordered tests and consults were completed: No Patient Arrived: Added or deleted any medications: No Arrival Time: 16:20 Any new allergies or adverse reactions: No Accompanied By: family member Had a fall or experienced change in No activities of daily living that may affect Transfer Assistance: Harrel Lemon Lift risk of falls: Patient Identification Verified: Yes Signs or symptoms of abuse/neglect since last No Secondary Verification Process Yes visito Completed: Hospitalized since last visit: No Patient Requires Transmission-Based No Implantable device outside of the clinic excluding No Precautions: cellular tissue based products placed in the center Patient Has Alerts: No since last visit: Has Dressing in Place as Prescribed: Yes Has Compression in Place as Prescribed: Yes Pain Present Now: No Electronic Signature(s) Signed: 08/09/2019 5:55:28 PM By: Carlene Coria RN Entered By: Carlene Coria on 08/09/2019 17:00:47 -------------------------------------------------------------------------------- Clinic Level of Care Assessment Details Patient Name: Date of Service: ONEAL, Banks 08/09/2019 3:45 PM Medical Record [id number] Patient Account Number: [id number] Date of Birth/Sex: Treating RN: 29-Dec-1928 (86 y.o. Female) Deon Pilling Primary Care Mikeala Girdler: Cassandria Anger Other Clinician: Referring Sherol Sabas: Treating Shenekia Riess/Extender:Robson,  Letitia Neri, Vivi Ferns in Treatment: 6 Clinic Level of Care Assessment Items TOOL 4 Quantity Score X - Use when only an EandM is performed on FOLLOW-UP visit 1 0 ASSESSMENTS - Nursing Assessment / Reassessment X - Reassessment of Co-morbidities (includes updates in patient status) 1 10 X - Reassessment of Adherence to Treatment Plan 1 5 ASSESSMENTS - Wound and Skin Assessment / Reassessment []  - Simple Wound Assessment / Reassessment - one wound 0 X - Complex Wound Assessment / Reassessment - multiple wounds 2 5 X - Dermatologic / Skin Assessment (not related to wound area) 1 10 ASSESSMENTS - Focused Assessment []  - Circumferential Edema Measurements - multi extremities 0 X - Nutritional Assessment / Counseling / Intervention 1 10 []  - Lower Extremity Assessment (monofilament, tuning fork, pulses) 0 []  - Peripheral Arterial Disease Assessment (using hand held doppler) 0 ASSESSMENTS - Ostomy and/or Continence Assessment and Care []  - Incontinence Assessment and Management 0 []  - Ostomy Care Assessment and Management (repouching, etc.) 0 PROCESS - Coordination of Care []  - Simple Patient / Family Education for ongoing care 0 X - Complex (extensive) Patient / Family Education for ongoing care 1 20 X - Staff obtains Programmer, systems, Records, Test Results / Process Orders 1 10 X - Staff telephones HHA, Nursing Homes / Clarify orders / etc 1 10 []  - Routine Transfer to another Facility (non-emergent condition) 0 []  - Routine Hospital Admission (non-emergent condition) 0 []  - New Admissions / Biomedical engineer / Ordering NPWT, Apligraf, etc. 0 []  - Emergency Hospital Admission (emergent condition) 0 []  - Simple Discharge Coordination 0 X - Complex (extensive) Discharge Coordination 1 15 PROCESS - Special Needs []  - Pediatric / Minor Patient Management 0 []  - Isolation Patient Management 0 []  - Hearing / Language / Visual special needs 0 []  - Assessment of Community assistance  (transportation, D/C planning, etc.) 0 []  - Additional assistance / Altered mentation 0 []  - Support Surface(s) Assessment (bed, cushion, seat, etc.) 0 INTERVENTIONS - Wound Cleansing / Measurement []  -  Simple Wound Cleansing - one wound 0 X - Complex Wound Cleansing - multiple wounds 2 5 X - Wound Imaging (photographs - any number of wounds) 1 5 []  - Wound Tracing (instead of photographs) 0 []  - Simple Wound Measurement - one wound 0 X - Complex Wound Measurement - multiple wounds 2 5 INTERVENTIONS - Wound Dressings X - Small Wound Dressing one or multiple wounds 1 10 []  - Medium Wound Dressing one or multiple wounds 0 []  - Large Wound Dressing one or multiple wounds 0 []  - Application of Medications - topical 0 []  - Application of Medications - injection 0 INTERVENTIONS - Miscellaneous []  - External ear exam 0 []  - Specimen Collection (cultures, biopsies, blood, body fluids, etc.) 0 []  - Specimen(s) / Culture(s) sent or taken to Lab for analysis 0 []  - Patient Transfer (multiple staff / Civil Service fast streamer / Similar devices) 0 []  - Simple Staple / Suture removal (25 or less) 0 []  - Complex Staple / Suture removal (26 or more) 0 []  - Hypo / Hyperglycemic Management (close monitor of Blood Glucose) 0 []  - Ankle / Brachial Index (ABI) - do not check if billed separately 0 X - Vital Signs 1 5 Has the patient been seen at the hospital within the last three years: Yes Total Score: 140 Level Of Care: New/Established - Level 4 Electronic Signature(s) Signed: 08/09/2019 6:05:23 PM By: Deon Pilling Entered By: Deon Pilling on 08/09/2019 16:54:03 -------------------------------------------------------------------------------- Encounter Discharge Information Details Patient Name: Date of Service: Holly, Banks 08/09/2019 3:45 PM Medical Record [id number] Patient Account Number: [id number] Date of Birth/Sex: Treating RN: [birth year] (86 y.o. Female) Levan Hurst Primary Care Liliyana Thobe:  Cassandria Anger Other Clinician: Referring Charene Mccallister: Treating Nathalia Wismer/Extender:Robson, Letitia Neri, Vivi Ferns in Treatment: 6 Encounter Discharge Information Items Discharge Condition: Stable Ambulatory Status: Wheelchair Discharge Destination: Home Transportation: Private Auto Accompanied By: daughter Schedule Follow-up Appointment: Yes Clinical Summary of Care: Patient Declined Electronic Signature(s) Signed: 08/09/2019 5:39:26 PM By: Levan Hurst RN, BSN Entered By: Levan Hurst on 08/09/2019 17:32:28 -------------------------------------------------------------------------------- Multi Wound Chart Details Patient Name: Date of Service: KRISETTE, CLOUTIER 08/09/2019 3:45 PM Medical Record [id number] Patient Account Number: [id number] Date of Birth/Sex: Treating RN: Oct 18, 1928 (86 y.o. Female) Primary Care Mahi Zabriskie: Cassandria Anger Other Clinician: Referring Sonora Catlin: Treating Pranay Hilbun/Extender:Robson, Letitia Neri, Vivi Ferns in Treatment: 6 Vital Signs Height(in): 77 Pulse(bpm): 18 Weight(lbs): 210 Blood Pressure(mmHg): 149/58 Body Mass Index(BMI): 38 Temperature(F): 98.3 Respiratory 18 Rate(breaths/min): Photos: [1:No Photos] [2:No Photos] [N/A:N/A] Wound Location: [1:Sacrum] [2:Right Calcaneus] [N/A:N/A] Wounding Event: [1:Pressure Injury] [2:Pressure Injury] [N/A:N/A] Primary Etiology: [1:Pressure Ulcer] [2:Pressure Ulcer] [N/A:N/A] Secondary Etiology: [1:N/A] [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] Comorbid History: [1:Hypertension, Type II Diabetes, Gout, Osteoarthritis, Neuropathy] [2:Hypertension, Type II Diabetes, Gout, Osteoarthritis, Neuropathy] [N/A:N/A] Date Acquired: [1:05/08/2019] [2:07/11/2019] [N/A:N/A] Weeks of Treatment: [1:6] [2:3] [N/A:N/A] Wound Status: [1:Open] [2:Healed - Epithelialized] [N/A:N/A] Measurements L x W x D 1.1x1.1x1.2 [2:0x0x0] [N/A:N/A] (cm) Area (cm) : [1:0.95] [2:0]  [N/A:N/A] Volume (cm) : [1:1.14] [2:0] [N/A:N/A] % Reduction in Area: [1:83.90%] [2:100.00%] [N/A:N/A] % Reduction in Volume: 92.60% [2:100.00%] [N/A:N/A] Classification: [1:Unstageable/Unclassified] [2:Category/Stage II] [N/A:N/A] Exudate Amount: [1:Medium] [2:Small] [N/A:N/A] Exudate Type: [1:Serosanguineous] [2:Serosanguineous] [N/A:N/A] Exudate Color: [1:red, brown] [2:red, brown] [N/A:N/A] Wound Margin: [1:Well defined, not attached] [2:Flat and Intact] [N/A:N/A] Granulation Amount: [1:Large (67-100%)] [2:Large (67-100%)] [N/A:N/A] Granulation Quality: [1:Red] [2:Pink] [N/A:N/A] Necrotic Amount: [1:Small (1-33%)] [2:None Present (0%)] [N/A:N/A] Exposed Structures: [1:Fat Layer (Subcutaneous Tissue) Exposed: Yes Fascia: No Tendon: No Muscle: No Joint: No Bone: No Small (1-33%)] [  2:Fat Layer (Subcutaneous Tissue) Exposed: Yes Fascia: No Tendon: No Muscle: No Joint: No Bone: No None] [N/A:N/A N/A] Treatment Notes Wound #1 (Sacrum) 1. Cleanse With Wound Cleanser 3. Primary Dressing Applied Collagen 4. Secondary Dressing Foam Border Dressing Electronic Signature(s) Signed: 08/09/2019 5:41:03 PM By: Linton Ham MD Entered By: Linton Ham on 08/09/2019 17:35:11 -------------------------------------------------------------------------------- Multi-Disciplinary Care Plan Details Patient Name: Date of Service: SHARLINE, SCHOMBURG 08/09/2019 3:45 PM Medical Record [id number] Patient Account Number: [id number] Date of Birth/Sex: Treating RN: May 01, 1929 (86 y.o. Female) Deon Pilling Primary Care Jarica Plass: Cassandria Anger Other Clinician: Referring Alysiana Ethridge: Treating Ashleymarie Granderson/Extender:Robson, Letitia Neri, Vivi Ferns in Treatment: 6 Active Inactive Wound/Skin Impairment Nursing Diagnoses: Knowledge deficit related to ulceration/compromised skin integrity Goals: Patient/caregiver will verbalize understanding of skin care regimen Date Initiated:  06/28/2019 Target Resolution Date: 08/31/2019 Goal Status: Active Interventions: Assess ulceration(s) every visit Provide education on ulcer and skin care Treatment Activities: Skin care regimen initiated : 06/28/2019 Topical wound management initiated : 06/28/2019 Notes: Electronic Signature(s) Signed: 08/09/2019 6:05:23 PM By: Deon Pilling Entered By: Deon Pilling on 08/09/2019 16:38:04 -------------------------------------------------------------------------------- Pain Assessment Details Patient Name: Date of Service: JESYKA, SCHILLER 08/09/2019 3:45 PM Medical Record [id number] Patient Account Number: [id number] Date of Birth/Sex: Treating RN: 1929-02-18 (86 y.o. Female) Carlene Coria Primary Care Norabelle Kondo: Cassandria Anger Other Clinician: Referring Sandeep Radell: Treating Starlena Beil/Extender:Robson, Letitia Neri, Vivi Ferns in Treatment: 6 Active Problems Location of Pain Severity and Description of Pain Patient Has Paino No Site Locations Pain Management and Medication Current Pain Management: Electronic Signature(s) Signed: 08/09/2019 5:55:28 PM By: Carlene Coria RN Entered By: Carlene Coria on 08/09/2019 16:21:33 -------------------------------------------------------------------------------- Patient/Caregiver Education Details Patient Name: Vilinda Flake 3/4/2021andnbsp3:45 Date of Service: PM Medical Record [id number] Number: Patient Account Number: [id number] Treating RN: Date of Birth/Gender: November 08, 1928 (86 y.o. Deon Pilling Female) Other Clinician: Primary Care Treating Plotnikov, Donney Rankins Physician: Physician/Extender: Referring Physician: Charlann Noss in Treatment: 6 Education Assessment Education Provided To: Patient Education Topics Provided Wound/Skin Impairment: Handouts: Skin Care Do's and Dont's Methods: Explain/Verbal Responses: Reinforcements needed Electronic Signature(s) Signed: 08/09/2019 6:05:23  PM By: Deon Pilling Entered By: Deon Pilling on 08/09/2019 16:38:13 -------------------------------------------------------------------------------- Wound Assessment Details Patient Name: Date of Service: DIANNIE, MINIX 08/09/2019 3:45 PM Medical Record [id number] Patient Account Number: [id number] Date of Birth/Sex: Treating RN: 17-May-1929 (86 y.o. Female) Deon Pilling Primary Care Ilianna Bown: Cassandria Anger Other Clinician: Referring Adolf Ormiston: Treating Enio Hornback/Extender:Robson, Letitia Neri, Vivi Ferns in Treatment: 6 Wound Status Wound Number: 1 Primary Pressure Ulcer Etiology: Wound Location: Sacrum Wound Open Wounding Event: Pressure Injury Status: Date Acquired: 05/08/2019 Comorbid Hypertension, Type II Diabetes, Gout, Weeks Of Treatment: 6 History: Osteoarthritis, Neuropathy Clustered Wound: No Photos Wound Measurements Length: (cm) 1.1 % Reduction i Width: (cm) 1.1 % Reduction i Depth: (cm) 1.2 Epithelializa Area: (cm) 0.95 Tunneling: Volume: (cm) 1.14 Undermining: Wound Description Classification: Unstageable/Unclassified Foul Odor Af Wound Margin: Well defined, not attached Slough/Fibri Exudate Amount: Medium Exudate Type: Serosanguineous Exudate Color: red, brown Wound Bed Granulation Amount: Large (67-100%) Granulation Quality: Red Fascia Expose Necrotic Amount: Small (1-33%) Fat Layer (Su Necrotic Quality: Adherent Slough Tendon Expose Muscle Expose Joint Exposed Bone Exposed: ter Cleansing: No no Yes Exposed Structure d: No bcutaneous Tissue) Exposed: Yes d: No d: No : No No n Area: 83.9% n Volume: 92.6% tion: Small (1-33%) No No Treatment Notes Wound #1 (Sacrum) 1. Cleanse With Wound Cleanser 3. Primary Dressing Applied Collagen 4. Secondary Dressing Foam Border Dressing Electronic Signature(s) Signed: 08/20/2019 5:11:29 PM By:  Mikeal Hawthorne EMT/HBOT Signed: 08/23/2019 4:55:35 PM By: Deon Pilling Previous Signature: 08/09/2019 6:05:23 PM Version By: Deon Pilling Entered By: Mikeal Hawthorne on 08/20/2019 15:25:16 -------------------------------------------------------------------------------- Wound Assessment Details Patient Name: Date of Service: AZALEIA, ARRELLANO 08/09/2019 3:45 PM Medical Record [id number] Patient Account Number: [id number] Date of Birth/Sex: Treating RN: 12/06/1928 (86 y.o. Female) Primary Care Vito Beg: Cassandria Anger Other Clinician: Referring Andrianna Manalang: Treating Kaysie Michelini/Extender:Robson, Letitia Neri, Vivi Ferns in Treatment: 6 Wound Status Wound Number: 2 Primary Pressure Ulcer Etiology: Wound Location: Right Calcaneus Secondary Diabetic Wound/Ulcer of the Lower Wounding Event: Pressure Injury Etiology: Extremity Date Acquired: 07/11/2019 Wound Healed - Epithelialized Weeks Of Treatment: 3 Status: Clustered Wound: No Comorbid Hypertension, Type II Diabetes, Gout, History: Osteoarthritis, Neuropathy Photos Wound Measurements Length: (cm) 0 % Reducti Width: (cm) 0 % Reducti Depth: (cm) 0 Epithelia Area: (cm) 0 Tunnelin Volume: (cm) 0 Undermin Wound Description Classification: Category/Stage II Foul Odo Wound Margin: Flat and Intact Slough/F Exudate Amount: Small Exudate Type: Serosanguineous Exudate Color: red, brown Wound Bed Granulation Amount: Large (67-100%) Granulation Quality: Pink Fascia Ex Necrotic Amount: None Present (0%) Fat Layer Tendon Ex Muscle Ex Joint Exp Bone Expo r After Cleansing: No ibrino No Exposed Structure posed: No (Subcutaneous Tissue) Exposed: Yes posed: No posed: No osed: No sed: No on in Area: 100% on in Volume: 100% lization: None g: No ing: No Electronic Signature(s) Signed: 08/20/2019 5:11:29 PM By: Mikeal Hawthorne EMT/HBOT Previous Signature: 08/09/2019 6:05:23 PM Version By: Deon Pilling Entered By: Mikeal Hawthorne on 08/20/2019  15:26:19 -------------------------------------------------------------------------------- Vitals Details Patient Name: Date of Service: RENIYA, MICHELINI 08/09/2019 3:45 PM Medical Record [id number] Patient Account Number: [id number] Date of Birth/Sex: Treating RN: Feb 03, 1929 (86 y.o. Female) Carlene Coria Primary Care Laiza Veenstra: Cassandria Anger Other Clinician: Referring Sanvi Ehler: Treating [name]/Extender:Robson, Letitia Neri, Vivi Ferns in Treatment: 6 Vital Signs Time Taken: 16:20 Temperature (F): 98.3 Height (in): 62 Pulse (bpm): 56 Weight (lbs): 210 Respiratory Rate (breaths/min): 18 Body Mass Index (BMI): 38.4 Blood Pressure (mmHg): 149/58 Reference Range: 80 - 120 mg / dl Electronic Signature(s) Signed: 08/09/2019 5:55:28 PM By: Carlene Coria RN Entered By: Carlene Coria on 08/09/2019 16:21:10

## 2019-08-10 DIAGNOSIS — M549 Dorsalgia, unspecified: Secondary | ICD-10-CM | POA: Diagnosis not present

## 2019-08-10 DIAGNOSIS — E538 Deficiency of other specified B group vitamins: Secondary | ICD-10-CM | POA: Diagnosis not present

## 2019-08-10 DIAGNOSIS — R627 Adult failure to thrive: Secondary | ICD-10-CM | POA: Diagnosis not present

## 2019-08-10 DIAGNOSIS — I129 Hypertensive chronic kidney disease with stage 1 through stage 4 chronic kidney disease, or unspecified chronic kidney disease: Secondary | ICD-10-CM | POA: Diagnosis not present

## 2019-08-10 DIAGNOSIS — N1832 Chronic kidney disease, stage 3b: Secondary | ICD-10-CM | POA: Diagnosis not present

## 2019-08-10 DIAGNOSIS — E669 Obesity, unspecified: Secondary | ICD-10-CM | POA: Diagnosis not present

## 2019-08-10 DIAGNOSIS — E559 Vitamin D deficiency, unspecified: Secondary | ICD-10-CM | POA: Diagnosis not present

## 2019-08-10 DIAGNOSIS — E1122 Type 2 diabetes mellitus with diabetic chronic kidney disease: Secondary | ICD-10-CM | POA: Diagnosis not present

## 2019-08-10 DIAGNOSIS — L89150 Pressure ulcer of sacral region, unstageable: Secondary | ICD-10-CM | POA: Diagnosis not present

## 2019-08-13 DIAGNOSIS — E538 Deficiency of other specified B group vitamins: Secondary | ICD-10-CM | POA: Diagnosis not present

## 2019-08-13 DIAGNOSIS — N1832 Chronic kidney disease, stage 3b: Secondary | ICD-10-CM | POA: Diagnosis not present

## 2019-08-13 DIAGNOSIS — R627 Adult failure to thrive: Secondary | ICD-10-CM | POA: Diagnosis not present

## 2019-08-13 DIAGNOSIS — L89150 Pressure ulcer of sacral region, unstageable: Secondary | ICD-10-CM | POA: Diagnosis not present

## 2019-08-13 DIAGNOSIS — E1122 Type 2 diabetes mellitus with diabetic chronic kidney disease: Secondary | ICD-10-CM | POA: Diagnosis not present

## 2019-08-13 DIAGNOSIS — I129 Hypertensive chronic kidney disease with stage 1 through stage 4 chronic kidney disease, or unspecified chronic kidney disease: Secondary | ICD-10-CM | POA: Diagnosis not present

## 2019-08-13 DIAGNOSIS — E669 Obesity, unspecified: Secondary | ICD-10-CM | POA: Diagnosis not present

## 2019-08-13 DIAGNOSIS — E559 Vitamin D deficiency, unspecified: Secondary | ICD-10-CM | POA: Diagnosis not present

## 2019-08-13 DIAGNOSIS — M549 Dorsalgia, unspecified: Secondary | ICD-10-CM | POA: Diagnosis not present

## 2019-08-15 DIAGNOSIS — L89150 Pressure ulcer of sacral region, unstageable: Secondary | ICD-10-CM | POA: Diagnosis not present

## 2019-08-15 DIAGNOSIS — E538 Deficiency of other specified B group vitamins: Secondary | ICD-10-CM | POA: Diagnosis not present

## 2019-08-15 DIAGNOSIS — N1832 Chronic kidney disease, stage 3b: Secondary | ICD-10-CM | POA: Diagnosis not present

## 2019-08-15 DIAGNOSIS — E669 Obesity, unspecified: Secondary | ICD-10-CM | POA: Diagnosis not present

## 2019-08-15 DIAGNOSIS — I129 Hypertensive chronic kidney disease with stage 1 through stage 4 chronic kidney disease, or unspecified chronic kidney disease: Secondary | ICD-10-CM | POA: Diagnosis not present

## 2019-08-15 DIAGNOSIS — E1122 Type 2 diabetes mellitus with diabetic chronic kidney disease: Secondary | ICD-10-CM | POA: Diagnosis not present

## 2019-08-15 DIAGNOSIS — E559 Vitamin D deficiency, unspecified: Secondary | ICD-10-CM | POA: Diagnosis not present

## 2019-08-15 DIAGNOSIS — M549 Dorsalgia, unspecified: Secondary | ICD-10-CM | POA: Diagnosis not present

## 2019-08-15 DIAGNOSIS — R627 Adult failure to thrive: Secondary | ICD-10-CM | POA: Diagnosis not present

## 2019-08-18 DIAGNOSIS — E669 Obesity, unspecified: Secondary | ICD-10-CM | POA: Diagnosis not present

## 2019-08-18 DIAGNOSIS — E1122 Type 2 diabetes mellitus with diabetic chronic kidney disease: Secondary | ICD-10-CM | POA: Diagnosis not present

## 2019-08-18 DIAGNOSIS — M549 Dorsalgia, unspecified: Secondary | ICD-10-CM | POA: Diagnosis not present

## 2019-08-18 DIAGNOSIS — E538 Deficiency of other specified B group vitamins: Secondary | ICD-10-CM | POA: Diagnosis not present

## 2019-08-18 DIAGNOSIS — N1832 Chronic kidney disease, stage 3b: Secondary | ICD-10-CM | POA: Diagnosis not present

## 2019-08-18 DIAGNOSIS — I129 Hypertensive chronic kidney disease with stage 1 through stage 4 chronic kidney disease, or unspecified chronic kidney disease: Secondary | ICD-10-CM | POA: Diagnosis not present

## 2019-08-18 DIAGNOSIS — L89150 Pressure ulcer of sacral region, unstageable: Secondary | ICD-10-CM | POA: Diagnosis not present

## 2019-08-18 DIAGNOSIS — R627 Adult failure to thrive: Secondary | ICD-10-CM | POA: Diagnosis not present

## 2019-08-18 DIAGNOSIS — E559 Vitamin D deficiency, unspecified: Secondary | ICD-10-CM | POA: Diagnosis not present

## 2019-08-19 DIAGNOSIS — R531 Weakness: Secondary | ICD-10-CM | POA: Diagnosis not present

## 2019-08-19 DIAGNOSIS — R269 Unspecified abnormalities of gait and mobility: Secondary | ICD-10-CM | POA: Diagnosis not present

## 2019-08-19 DIAGNOSIS — L89322 Pressure ulcer of left buttock, stage 2: Secondary | ICD-10-CM | POA: Diagnosis not present

## 2019-08-20 DIAGNOSIS — M549 Dorsalgia, unspecified: Secondary | ICD-10-CM | POA: Diagnosis not present

## 2019-08-20 DIAGNOSIS — L89150 Pressure ulcer of sacral region, unstageable: Secondary | ICD-10-CM | POA: Diagnosis not present

## 2019-08-20 DIAGNOSIS — E1122 Type 2 diabetes mellitus with diabetic chronic kidney disease: Secondary | ICD-10-CM | POA: Diagnosis not present

## 2019-08-20 DIAGNOSIS — I129 Hypertensive chronic kidney disease with stage 1 through stage 4 chronic kidney disease, or unspecified chronic kidney disease: Secondary | ICD-10-CM | POA: Diagnosis not present

## 2019-08-20 DIAGNOSIS — E669 Obesity, unspecified: Secondary | ICD-10-CM | POA: Diagnosis not present

## 2019-08-20 DIAGNOSIS — N1832 Chronic kidney disease, stage 3b: Secondary | ICD-10-CM | POA: Diagnosis not present

## 2019-08-20 DIAGNOSIS — E559 Vitamin D deficiency, unspecified: Secondary | ICD-10-CM | POA: Diagnosis not present

## 2019-08-20 DIAGNOSIS — E538 Deficiency of other specified B group vitamins: Secondary | ICD-10-CM | POA: Diagnosis not present

## 2019-08-20 DIAGNOSIS — R627 Adult failure to thrive: Secondary | ICD-10-CM | POA: Diagnosis not present

## 2019-08-22 DIAGNOSIS — E538 Deficiency of other specified B group vitamins: Secondary | ICD-10-CM | POA: Diagnosis not present

## 2019-08-22 DIAGNOSIS — I129 Hypertensive chronic kidney disease with stage 1 through stage 4 chronic kidney disease, or unspecified chronic kidney disease: Secondary | ICD-10-CM | POA: Diagnosis not present

## 2019-08-22 DIAGNOSIS — N1832 Chronic kidney disease, stage 3b: Secondary | ICD-10-CM | POA: Diagnosis not present

## 2019-08-22 DIAGNOSIS — E559 Vitamin D deficiency, unspecified: Secondary | ICD-10-CM | POA: Diagnosis not present

## 2019-08-22 DIAGNOSIS — M549 Dorsalgia, unspecified: Secondary | ICD-10-CM | POA: Diagnosis not present

## 2019-08-22 DIAGNOSIS — R627 Adult failure to thrive: Secondary | ICD-10-CM | POA: Diagnosis not present

## 2019-08-22 DIAGNOSIS — E1122 Type 2 diabetes mellitus with diabetic chronic kidney disease: Secondary | ICD-10-CM | POA: Diagnosis not present

## 2019-08-22 DIAGNOSIS — L89150 Pressure ulcer of sacral region, unstageable: Secondary | ICD-10-CM | POA: Diagnosis not present

## 2019-08-22 DIAGNOSIS — E669 Obesity, unspecified: Secondary | ICD-10-CM | POA: Diagnosis not present

## 2019-08-24 DIAGNOSIS — N1832 Chronic kidney disease, stage 3b: Secondary | ICD-10-CM | POA: Diagnosis not present

## 2019-08-24 DIAGNOSIS — M549 Dorsalgia, unspecified: Secondary | ICD-10-CM | POA: Diagnosis not present

## 2019-08-24 DIAGNOSIS — E559 Vitamin D deficiency, unspecified: Secondary | ICD-10-CM | POA: Diagnosis not present

## 2019-08-24 DIAGNOSIS — L89150 Pressure ulcer of sacral region, unstageable: Secondary | ICD-10-CM | POA: Diagnosis not present

## 2019-08-24 DIAGNOSIS — E1122 Type 2 diabetes mellitus with diabetic chronic kidney disease: Secondary | ICD-10-CM | POA: Diagnosis not present

## 2019-08-24 DIAGNOSIS — R627 Adult failure to thrive: Secondary | ICD-10-CM | POA: Diagnosis not present

## 2019-08-24 DIAGNOSIS — E538 Deficiency of other specified B group vitamins: Secondary | ICD-10-CM | POA: Diagnosis not present

## 2019-08-24 DIAGNOSIS — I129 Hypertensive chronic kidney disease with stage 1 through stage 4 chronic kidney disease, or unspecified chronic kidney disease: Secondary | ICD-10-CM | POA: Diagnosis not present

## 2019-08-24 DIAGNOSIS — E669 Obesity, unspecified: Secondary | ICD-10-CM | POA: Diagnosis not present

## 2019-08-25 DIAGNOSIS — R627 Adult failure to thrive: Secondary | ICD-10-CM | POA: Diagnosis not present

## 2019-08-25 DIAGNOSIS — M549 Dorsalgia, unspecified: Secondary | ICD-10-CM | POA: Diagnosis not present

## 2019-08-25 DIAGNOSIS — E559 Vitamin D deficiency, unspecified: Secondary | ICD-10-CM | POA: Diagnosis not present

## 2019-08-25 DIAGNOSIS — E1122 Type 2 diabetes mellitus with diabetic chronic kidney disease: Secondary | ICD-10-CM | POA: Diagnosis not present

## 2019-08-25 DIAGNOSIS — E538 Deficiency of other specified B group vitamins: Secondary | ICD-10-CM | POA: Diagnosis not present

## 2019-08-25 DIAGNOSIS — L89150 Pressure ulcer of sacral region, unstageable: Secondary | ICD-10-CM | POA: Diagnosis not present

## 2019-08-25 DIAGNOSIS — N1832 Chronic kidney disease, stage 3b: Secondary | ICD-10-CM | POA: Diagnosis not present

## 2019-08-25 DIAGNOSIS — E669 Obesity, unspecified: Secondary | ICD-10-CM | POA: Diagnosis not present

## 2019-08-25 DIAGNOSIS — I129 Hypertensive chronic kidney disease with stage 1 through stage 4 chronic kidney disease, or unspecified chronic kidney disease: Secondary | ICD-10-CM | POA: Diagnosis not present

## 2019-08-27 ENCOUNTER — Telehealth: Payer: Self-pay | Admitting: Internal Medicine

## 2019-08-27 DIAGNOSIS — L89150 Pressure ulcer of sacral region, unstageable: Secondary | ICD-10-CM | POA: Diagnosis not present

## 2019-08-27 DIAGNOSIS — I129 Hypertensive chronic kidney disease with stage 1 through stage 4 chronic kidney disease, or unspecified chronic kidney disease: Secondary | ICD-10-CM | POA: Diagnosis not present

## 2019-08-27 DIAGNOSIS — E669 Obesity, unspecified: Secondary | ICD-10-CM

## 2019-08-27 DIAGNOSIS — E559 Vitamin D deficiency, unspecified: Secondary | ICD-10-CM

## 2019-08-27 DIAGNOSIS — Z9181 History of falling: Secondary | ICD-10-CM

## 2019-08-27 DIAGNOSIS — M549 Dorsalgia, unspecified: Secondary | ICD-10-CM

## 2019-08-27 DIAGNOSIS — E1122 Type 2 diabetes mellitus with diabetic chronic kidney disease: Secondary | ICD-10-CM | POA: Diagnosis not present

## 2019-08-27 DIAGNOSIS — R627 Adult failure to thrive: Secondary | ICD-10-CM

## 2019-08-27 DIAGNOSIS — Z7984 Long term (current) use of oral hypoglycemic drugs: Secondary | ICD-10-CM

## 2019-08-27 DIAGNOSIS — Z7982 Long term (current) use of aspirin: Secondary | ICD-10-CM

## 2019-08-27 DIAGNOSIS — N1832 Chronic kidney disease, stage 3b: Secondary | ICD-10-CM | POA: Diagnosis not present

## 2019-08-27 DIAGNOSIS — Z6838 Body mass index (BMI) 38.0-38.9, adult: Secondary | ICD-10-CM

## 2019-08-27 DIAGNOSIS — E538 Deficiency of other specified B group vitamins: Secondary | ICD-10-CM

## 2019-08-27 NOTE — Telephone Encounter (Signed)
LM giving verbals, FYI 

## 2019-08-27 NOTE — Telephone Encounter (Signed)
Holly Banks with [facility] called and was wondering if they could continue wound care for three times a week for three more weeks. A good call back number for her is [phone]

## 2019-08-29 DIAGNOSIS — E1122 Type 2 diabetes mellitus with diabetic chronic kidney disease: Secondary | ICD-10-CM | POA: Diagnosis not present

## 2019-08-29 DIAGNOSIS — R627 Adult failure to thrive: Secondary | ICD-10-CM | POA: Diagnosis not present

## 2019-08-29 DIAGNOSIS — L89150 Pressure ulcer of sacral region, unstageable: Secondary | ICD-10-CM | POA: Diagnosis not present

## 2019-08-29 DIAGNOSIS — E669 Obesity, unspecified: Secondary | ICD-10-CM | POA: Diagnosis not present

## 2019-08-29 DIAGNOSIS — M549 Dorsalgia, unspecified: Secondary | ICD-10-CM | POA: Diagnosis not present

## 2019-08-29 DIAGNOSIS — E538 Deficiency of other specified B group vitamins: Secondary | ICD-10-CM | POA: Diagnosis not present

## 2019-08-29 DIAGNOSIS — N1832 Chronic kidney disease, stage 3b: Secondary | ICD-10-CM | POA: Diagnosis not present

## 2019-08-29 DIAGNOSIS — E559 Vitamin D deficiency, unspecified: Secondary | ICD-10-CM | POA: Diagnosis not present

## 2019-08-29 DIAGNOSIS — I129 Hypertensive chronic kidney disease with stage 1 through stage 4 chronic kidney disease, or unspecified chronic kidney disease: Secondary | ICD-10-CM | POA: Diagnosis not present

## 2019-08-31 DIAGNOSIS — E538 Deficiency of other specified B group vitamins: Secondary | ICD-10-CM | POA: Diagnosis not present

## 2019-08-31 DIAGNOSIS — E669 Obesity, unspecified: Secondary | ICD-10-CM | POA: Diagnosis not present

## 2019-08-31 DIAGNOSIS — L89150 Pressure ulcer of sacral region, unstageable: Secondary | ICD-10-CM | POA: Diagnosis not present

## 2019-08-31 DIAGNOSIS — E559 Vitamin D deficiency, unspecified: Secondary | ICD-10-CM | POA: Diagnosis not present

## 2019-08-31 DIAGNOSIS — M549 Dorsalgia, unspecified: Secondary | ICD-10-CM | POA: Diagnosis not present

## 2019-08-31 DIAGNOSIS — E1122 Type 2 diabetes mellitus with diabetic chronic kidney disease: Secondary | ICD-10-CM | POA: Diagnosis not present

## 2019-08-31 DIAGNOSIS — N1832 Chronic kidney disease, stage 3b: Secondary | ICD-10-CM | POA: Diagnosis not present

## 2019-08-31 DIAGNOSIS — R627 Adult failure to thrive: Secondary | ICD-10-CM | POA: Diagnosis not present

## 2019-08-31 DIAGNOSIS — I129 Hypertensive chronic kidney disease with stage 1 through stage 4 chronic kidney disease, or unspecified chronic kidney disease: Secondary | ICD-10-CM | POA: Diagnosis not present

## 2019-09-03 DIAGNOSIS — E669 Obesity, unspecified: Secondary | ICD-10-CM | POA: Diagnosis not present

## 2019-09-03 DIAGNOSIS — E559 Vitamin D deficiency, unspecified: Secondary | ICD-10-CM | POA: Diagnosis not present

## 2019-09-03 DIAGNOSIS — I129 Hypertensive chronic kidney disease with stage 1 through stage 4 chronic kidney disease, or unspecified chronic kidney disease: Secondary | ICD-10-CM | POA: Diagnosis not present

## 2019-09-03 DIAGNOSIS — E1122 Type 2 diabetes mellitus with diabetic chronic kidney disease: Secondary | ICD-10-CM | POA: Diagnosis not present

## 2019-09-03 DIAGNOSIS — M549 Dorsalgia, unspecified: Secondary | ICD-10-CM | POA: Diagnosis not present

## 2019-09-03 DIAGNOSIS — E538 Deficiency of other specified B group vitamins: Secondary | ICD-10-CM | POA: Diagnosis not present

## 2019-09-03 DIAGNOSIS — L89150 Pressure ulcer of sacral region, unstageable: Secondary | ICD-10-CM | POA: Diagnosis not present

## 2019-09-03 DIAGNOSIS — N1832 Chronic kidney disease, stage 3b: Secondary | ICD-10-CM | POA: Diagnosis not present

## 2019-09-03 DIAGNOSIS — R627 Adult failure to thrive: Secondary | ICD-10-CM | POA: Diagnosis not present

## 2019-09-04 ENCOUNTER — Encounter (HOSPITAL_BASED_OUTPATIENT_CLINIC_OR_DEPARTMENT_OTHER): Payer: Medicare HMO | Admitting: Internal Medicine

## 2019-09-04 ENCOUNTER — Other Ambulatory Visit: Payer: Self-pay

## 2019-09-04 DIAGNOSIS — H5461 Unqualified visual loss, right eye, normal vision left eye: Secondary | ICD-10-CM | POA: Diagnosis not present

## 2019-09-04 DIAGNOSIS — L89150 Pressure ulcer of sacral region, unstageable: Secondary | ICD-10-CM | POA: Diagnosis not present

## 2019-09-04 DIAGNOSIS — E1169 Type 2 diabetes mellitus with other specified complication: Secondary | ICD-10-CM | POA: Diagnosis not present

## 2019-09-04 DIAGNOSIS — E1142 Type 2 diabetes mellitus with diabetic polyneuropathy: Secondary | ICD-10-CM | POA: Diagnosis not present

## 2019-09-04 DIAGNOSIS — I10 Essential (primary) hypertension: Secondary | ICD-10-CM | POA: Diagnosis not present

## 2019-09-04 DIAGNOSIS — R2689 Other abnormalities of gait and mobility: Secondary | ICD-10-CM | POA: Diagnosis not present

## 2019-09-04 DIAGNOSIS — L89612 Pressure ulcer of right heel, stage 2: Secondary | ICD-10-CM | POA: Diagnosis not present

## 2019-09-05 DIAGNOSIS — E538 Deficiency of other specified B group vitamins: Secondary | ICD-10-CM | POA: Diagnosis not present

## 2019-09-05 DIAGNOSIS — E669 Obesity, unspecified: Secondary | ICD-10-CM | POA: Diagnosis not present

## 2019-09-05 DIAGNOSIS — I129 Hypertensive chronic kidney disease with stage 1 through stage 4 chronic kidney disease, or unspecified chronic kidney disease: Secondary | ICD-10-CM | POA: Diagnosis not present

## 2019-09-05 DIAGNOSIS — R627 Adult failure to thrive: Secondary | ICD-10-CM | POA: Diagnosis not present

## 2019-09-05 DIAGNOSIS — L89150 Pressure ulcer of sacral region, unstageable: Secondary | ICD-10-CM | POA: Diagnosis not present

## 2019-09-05 DIAGNOSIS — N1832 Chronic kidney disease, stage 3b: Secondary | ICD-10-CM | POA: Diagnosis not present

## 2019-09-05 DIAGNOSIS — E559 Vitamin D deficiency, unspecified: Secondary | ICD-10-CM | POA: Diagnosis not present

## 2019-09-05 DIAGNOSIS — M549 Dorsalgia, unspecified: Secondary | ICD-10-CM | POA: Diagnosis not present

## 2019-09-05 DIAGNOSIS — E1122 Type 2 diabetes mellitus with diabetic chronic kidney disease: Secondary | ICD-10-CM | POA: Diagnosis not present

## 2019-09-05 NOTE — Progress Notes (Addendum)
**Note DeBanksIdentified via Obfuscation** TATUMN, SWIECICKI ([id number]) Visit Report for 09/04/2019 Arrival Information Details Patient Name: Date of Service: Holly Holly [facility] 09/04/2019 3:15 PM Medical Record Number: [id number] Patient Account Number: [id number] Date of Birth/Sex: Treating RN: Nov 27, 1928 (86 y.o. Holly [facility], Tammi Klippel Primary Care Docie Abramovich: Cassandria Anger Other Clinician: Referring Anya Murphey: Treating Tiajah Oyster/Extender: Thayer Headings in Treatment: 9 Visit Information History Since Last Visit Added or deleted any medications: No Patient Arrived: Wheel Chair Any new allergies or adverse reactions: No Arrival Time: 15:59 Had a fall or experienced change in No Accompanied By: daugther activities of daily living that may affect Transfer Assistance: Harrel Lemon Lift risk of falls: Patient Identification Verified: Yes Signs or symptoms of abuse/neglect since last visito No Secondary Verification Process Completed: Yes Hospitalized since last visit: No Patient Requires TransmissionBanksBased Precautions: No Implantable device outside of the clinic excluding No Patient Has Alerts: No cellular tissue based products placed in the center since last visit: Has Dressing in Place as Prescribed: Yes Pain Present Now: No Electronic Signature(s) Signed: 09/05/2019 5:08:19 PM By: Deon Pilling Entered By: Deon Pilling on 09/04/2019 16:01:29 -------------------------------------------------------------------------------- Clinic Level of Care Assessment Details Patient Name: Date of Service: Holly Holly [facility] 09/04/2019 3:15 PM Medical Record Number: [id number] Patient Account Number: [id number] Date of Birth/Sex: Treating RN: [id number] (86 y.o. Holly Banks Primary Care Stephen Baruch: Cassandria Anger Other Clinician: Referring Dorance Spink: Treating Ariabella Brien/Extender: Thayer Headings in Treatment: 9 Clinic Level of Care Assessment Items TOOL 4 Quantity Score X- 1  0 Use when only an EandM is performed on FOLLOWBanksUP visit ASSESSMENTS - Nursing Assessment / Reassessment X- 1 10 Reassessment of CoBanksmorbidities (includes updates in patient status) X- 1 5 Reassessment of Adherence to Treatment Plan ASSESSMENTS - Wound and Skin A ssessment / Reassessment X - Simple Wound Assessment / Reassessment - one wound 1 5 []  - 0 Complex Wound Assessment / Reassessment - multiple wounds []  - 0 Dermatologic / Skin Assessment (not related to wound area) ASSESSMENTS - Focused Assessment []  - 0 Circumferential Edema Measurements - multi extremities []  - 0 Nutritional Assessment / Counseling / Intervention []  - 0 Lower Extremity Assessment (monofilament, tuning fork, pulses) []  - 0 Peripheral Arterial Disease Assessment (using hand held doppler) ASSESSMENTS - Ostomy and/or Continence Assessment and Care []  - 0 Incontinence Assessment and Management []  - 0 Ostomy Care Assessment and Management (repouching, etc.) PROCESS - Coordination of Care X - Simple Patient / Family Education for ongoing care 1 15 []  - 0 Complex (extensive) Patient / Family Education for ongoing care X- 1 10 Staff obtains Programmer, systems, Records, T Results / Process Orders est []  - 0 Staff telephones HHA, Nursing Homes / Clarify orders / etc []  - 0 Routine Transfer to another Facility (nonBanksemergent condition) []  - 0 Routine [facility] Admission (nonBanksemergent condition) []  - 0 New Admissions / Biomedical engineer / Ordering NPWT Apligraf, etc. , []  - 0 Emergency [facility] Admission (emergent condition) X- 1 10 Simple Discharge Coordination []  - 0 Complex (extensive) Discharge Coordination PROCESS - Special Needs []  - 0 Pediatric / Minor Patient Management []  - 0 Isolation Patient Management []  - 0 Hearing / Language / Visual special needs []  - 0 Assessment of Community assistance (transportation, D/C planning, etc.) []  - 0 Additional assistance / Altered mentation []  -  0 Support Surface(s) Assessment (bed, cushion, seat, etc.) INTERVENTIONS - Wound Cleansing / Measurement X - Simple Wound Cleansing - one wound 1 5 []  - 0 Complex Wound Cleansing -  multiple wounds X- 1 5 Wound Imaging (photographs - any number of wounds) []  - 0 Wound Tracing (instead of photographs) X- 1 5 Simple Wound Measurement - one wound []  - 0 Complex Wound Measurement - multiple wounds INTERVENTIONS - Wound Dressings []  - 0 Small Wound Dressing one or multiple wounds X- 1 15 Medium Wound Dressing one or multiple wounds []  - 0 Large Wound Dressing one or multiple wounds X- 1 5 Application of Medications - topical []  - 0 Application of Medications - injection INTERVENTIONS - Miscellaneous []  - 0 External ear exam []  - 0 Specimen Collection (cultures, biopsies, blood, body fluids, etc.) []  - 0 Specimen(s) / Culture(s) sent or taken to Lab for analysis []  - 0 Patient Transfer (multiple staff / Civil Service fast streamer / Similar devices) []  - 0 Simple Staple / Suture removal (25 or less) []  - 0 Complex Staple / Suture removal (26 or more) []  - 0 Hypo / Hyperglycemic Management (close monitor of Blood Glucose) []  - 0 Ankle / Brachial Index (ABI) - do not check if billed separately X- 1 5 Vital Signs Has the patient been seen at the [facility] within the last three years: Yes Total Score: 95 Level Of Care: New/Established - Level 3 Electronic Signature(s) Signed: 09/05/2019 5:40:01 PM By: Carlene Coria RN Entered By: Carlene Coria on 09/05/2019 09:14:20 -------------------------------------------------------------------------------- Encounter Discharge Information Details Patient Name: Date of Service: Alvan, Holly [facility] 09/04/2019 3:15 PM Medical Record Number: [id number] Patient Account Number: [id number] Date of Birth/Sex: Treating RN: [id number] (86 y.o. Holly Banks Primary Care Brinae Woods: Cassandria Anger Other Clinician: Referring Hoorain Kozakiewicz: Treating  Yavier Snider/Extender: Thayer Headings in Treatment: 9 Encounter Discharge Information Items Discharge Condition: Stable Ambulatory Status: Wheelchair Discharge Destination: Home Transportation: Private Auto Accompanied By: daughter Schedule FollowBanksup Appointment: Yes Clinical Summary of Care: Patient Declined Electronic Signature(s) Signed: 09/10/2019 4:55:48 PM By: Kela Millin Entered By: Kela Millin on 09/07/2019 08:43:24 -------------------------------------------------------------------------------- Lower Extremity Assessment Details Patient Name: Date of Service: Holly Holly [facility] 09/04/2019 3:15 PM Medical Record Number: [id number] Patient Account Number: [id number] Date of Birth/Sex: Treating RN: [id number] (86 y.o. Holly Banks Primary Care Dauna Ziska: Cassandria Anger Other Clinician: Referring Aicha Clingenpeel: Treating Raden Byington/Extender: Thayer Headings in Treatment: 9 Electronic Signature(s) Signed: 09/05/2019 5:08:19 PM By: Deon Pilling Entered By: Deon Pilling on 09/05/2019 07:55:16 -------------------------------------------------------------------------------- Multi Wound Chart Details Patient Name: Date of Service: [place], Holly [facility] 09/04/2019 3:15 PM Medical Record Number: [id number] Patient Account Number: [id number] Date of Birth/Sex: Treating RN: [birth year] (86 y.o. Holly Banks Primary Care Stacey Maura: Cassandria Anger Other Clinician: Referring Nilda Keathley: Treating Rochelle Nephew/Extender: Thayer Headings in Treatment: 9 Vital Signs Height(in): 7 Pulse(bpm): 29 Weight(lbs): 210 Blood Pressure(mmHg): 148/72 Body Mass Index(BMI): 38 Temperature(F): 98.6 Respiratory Rate(breaths/min): 20 Photos: [N/A:N/A] Sacrum N/A N/A Wound Location: Pressure Injury N/A N/A Wounding Event: Pressure Ulcer N/A N/A Primary Etiology: Hypertension, Type II Diabetes, Gout, N/A  N/A Comorbid History: Osteoarthritis, Neuropathy 05/08/2019 N/A N/A Date Acquired: 9 N/A N/A Weeks of Treatment: Open N/A N/A Wound Status: 0.7x0.4x0.4 N/A N/A Measurements L x W x D (cm) 0.22 N/A N/A A (cm) : rea 0.088 N/A N/A Volume (cm) : 96.30% N/A N/A % Reduction in A rea: 99.40% N/A N/A % Reduction in Volume: Category/Stage III N/A N/A Classification: Medium N/A N/A Exudate A mount: Serosanguineous N/A N/A Exudate Type: red, brown N/A N/A Exudate Color: Well defined, not attached N/A N/A Wound Margin: Large (67Banks100%)  N/A N/A Granulation A mount: Red N/A N/A Granulation Quality: None Present (0%) N/A N/A Necrotic A mount: Fat Layer (Subcutaneous Tissue) N/A N/A Exposed Structures: Exposed: Yes Fascia: No Tendon: No Muscle: No Joint: No Bone: No Large (67Banks100%) N/A N/A Epithelialization: Treatment Notes Wound #1 (Sacrum) 1. Cleanse With Wound Cleanser 2. Periwound Care Skin Prep 3. Primary Dressing Applied Collagen 4. Secondary Dressing ABD Pad Dry Gauze 5. Secured With Recruitment consultant) Signed: 09/10/2019 7:44:34 AM By: Linton Ham MD Signed: 12/04/2019 5:24:59 PM By: Carlene Coria RN Entered By: Linton Ham on 09/09/2019 07:17:20 -------------------------------------------------------------------------------- MultiBanksDisciplinary Care Plan Details Patient Name: Date of Service: Holly Banks, Holly Banks 09/04/2019 3:15 PM Medical Record Number: [id number] Patient Account Number: [id number] Date of Birth/Sex: Treating RN: 1929/05/31 (86 y.o. Holly Banks Primary Care Shantae Vantol: Cassandria Anger Other Clinician: Referring Atreus Hasz: Treating Ercell Perlman/Extender: Thayer Headings in Treatment: 9 Active Inactive Electronic Signature(s) Signed: 12/04/2019 5:24:59 PM By: Carlene Coria RN Entered By: Carlene Coria on 09/28/2019  14:14:22 -------------------------------------------------------------------------------- Pain Assessment Details Patient Name: Date of Service: Holly [facility] 09/04/2019 3:15 PM Medical Record Number: [id number] Patient Account Number: [id number] Date of Birth/Sex: Treating RN: [id number] (86 y.o. Holly Banks Primary Care Creg Gilmer: Cassandria Anger Other Clinician: Referring Gray Maugeri: Treating Karina Nofsinger/Extender: Thayer Headings in Treatment: 9 Active Problems Location of Pain Severity and Description of Pain Patient Has Paino No Site Locations Rate the pain. Current Pain Level: 0 Pain Management and Medication Current Pain Management: Medication: No Cold Application: No Rest: No Massage: No Activity: No T.E.N.S.: No Heat Application: No Leg drop or elevation: No Is the Current Pain Management Adequate: Adequate How does your wound impact your activities of daily livingo Sleep: No Bathing: No Appetite: No Relationship With Others: No Bladder Continence: No Emotions: No Bowel Continence: No Work: No Toileting: No Drive: No Dressing: No Hobbies: No Electronic Signature(s) Signed: 09/05/2019 5:08:19 PM By: Deon Pilling Entered By: Deon Pilling on 09/05/2019 07:55:08 -------------------------------------------------------------------------------- Patient/Caregiver Education Details Patient Name: Date of Service: Holly Banks, Holly Banks 3/30/2021andnbsp3:15 PM Medical Record Number: [id number] Patient Account Number: [id number] Date of Birth/Gender: Treating RN: December 03, 1928 (86 y.o. Holly Banks Primary Care Physician: Cassandria Anger Other Clinician: Referring Physician: Treating Physician/Extender: Thayer Headings in Treatment: 9 Education Assessment Education Provided To: Patient Education Topics Provided Wound/Skin Impairment: Methods: Explain/Verbal Responses: State content  correctly [employer]) Signed: 12/04/2019 5:24:59 PM By: Carlene Coria RN Entered By: Carlene Coria on 09/28/2019 14:14:48 -------------------------------------------------------------------------------- Wound Assessment Details Patient Name: Date of Service: Holly Banks 09/04/2019 3:15 PM Medical Record Number: [id number] Patient Account Number: [id number] Date of Birth/Sex: Treating RN: 07/07/1928 (86 y.o. Holly Banks, Holly Banks Primary Care Jaleeyah Munce: Cassandria Anger Other Clinician: Referring Keaten Mashek: Treating Loi Rennaker/Extender: Thayer Headings in Treatment: 9 Wound Status Wound Number: 1 Primary Pressure Ulcer Etiology: Wound Location: Sacrum Wound Status: Open Wounding Event: Pressure Injury Comorbid Hypertension, Type II Diabetes, Gout, Osteoarthritis, Date Acquired: 05/08/2019 History: Neuropathy Weeks Of Treatment: 9 Clustered Wound: No Photos Photo Uploaded By: Mikeal Hawthorne on 09/07/2019 15:46:33 Wound Measurements Length: (cm) 0.7 Width: (cm) 0.4 Depth: (cm) 0.4 Area: (cm) 0.22 Volume: (cm) 0.088 % Reduction in Area: 96.3% % Reduction in Volume: 99.4% Epithelialization: Large (67Banks100%) Tunneling: No Undermining: No Wound Description Classification: Category/Stage III Wound Margin: Well defined, not attached Exudate Amount: Medium Exudate Type: Serosanguineous Exudate Color: red, brown Foul Odor After Cleansing: No Slough/Fibrino No Wound  Bed Granulation Amount: Large (67Banks100%) Exposed Structure Granulation Quality: Red Fascia Exposed: No Necrotic Amount: None Present (0%) Fat Layer (Subcutaneous Tissue) Exposed: Yes Tendon Exposed: No Muscle Exposed: No Joint Exposed: No Bone Exposed: No Electronic Signature(s) Signed: 09/05/2019 5:08:19 PM By: Deon Pilling Entered By: Deon Pilling on 09/05/2019 07:56:07 -------------------------------------------------------------------------------- Vitals  Details Patient Name: Date of Service: Holly Banks, Holly Banks 09/04/2019 3:15 PM Medical Record Number: [id number] Patient Account Number: [id number] Date of Birth/Sex: Treating RN: [id number] (86 y.o. Holly Banks, Holly Banks Primary Care Zarah Carbon: Cassandria Anger Other Clinician: Referring Luken Shadowens: Treating Vick Filter/Extender: Thayer Headings in Treatment: 9 Vital Signs Time Taken: 15:59 Temperature (F): 98.6 Height (in): 62 Pulse (bpm): 62 Weight (lbs): 210 Respiratory Rate (breaths/min): 20 Body Mass Index (BMI): 38.4 Blood Pressure (mmHg): 148/72 Reference Range: 80 - 120 mg / dl Electronic Signature(s) Signed: 09/05/2019 5:08:19 PM By: Deon Pilling Entered By: Deon Pilling on 09/05/2019 07:55:00

## 2019-09-06 ENCOUNTER — Encounter (HOSPITAL_BASED_OUTPATIENT_CLINIC_OR_DEPARTMENT_OTHER): Payer: Medicare HMO | Admitting: Internal Medicine

## 2019-09-07 DIAGNOSIS — N1832 Chronic kidney disease, stage 3b: Secondary | ICD-10-CM | POA: Diagnosis not present

## 2019-09-07 DIAGNOSIS — I129 Hypertensive chronic kidney disease with stage 1 through stage 4 chronic kidney disease, or unspecified chronic kidney disease: Secondary | ICD-10-CM | POA: Diagnosis not present

## 2019-09-07 DIAGNOSIS — L89150 Pressure ulcer of sacral region, unstageable: Secondary | ICD-10-CM | POA: Diagnosis not present

## 2019-09-07 DIAGNOSIS — E1122 Type 2 diabetes mellitus with diabetic chronic kidney disease: Secondary | ICD-10-CM | POA: Diagnosis not present

## 2019-09-07 DIAGNOSIS — R627 Adult failure to thrive: Secondary | ICD-10-CM | POA: Diagnosis not present

## 2019-09-07 DIAGNOSIS — E538 Deficiency of other specified B group vitamins: Secondary | ICD-10-CM | POA: Diagnosis not present

## 2019-09-07 DIAGNOSIS — E559 Vitamin D deficiency, unspecified: Secondary | ICD-10-CM | POA: Diagnosis not present

## 2019-09-07 DIAGNOSIS — M549 Dorsalgia, unspecified: Secondary | ICD-10-CM | POA: Diagnosis not present

## 2019-09-07 DIAGNOSIS — E669 Obesity, unspecified: Secondary | ICD-10-CM | POA: Diagnosis not present

## 2019-09-10 NOTE — Progress Notes (Signed)
JASHAE, MOUSEL ([id number]) Visit Report for 09/04/2019 HPI Details Patient Name: Date of Service: Holly Banks, Holly Banks 09/04/2019 3:15 PM Medical Record [id number] Patient Account Number: [id number] Date of Birth/Sex: Treating RN: 08/16/1928 (86 y.o. Orvan Falconer Primary Care Provider: Cassandria Anger Other Clinician: Referring Provider: Treating Provider/Extender:Vian Fluegel, Letitia Neri, Vivi Ferns in Treatment: 9 History of Present Illness HPI Description: Admission 06/28/2019 This is a frail 86 year old woman who arrives with a 1-1/2 to 40-month history with an area on her lower sacrum. She lives at home relatively immobile secondary to diabetic neuropathy, osteoarthritis and gait and balance issues. She was seen by telemetry health however by her primary doctor in October and was noted to have a left buttock stage II. They were applying Tegaderm to this for a while but that did not help she is now just using dry gauze after cleaning the wound. She apparently is not eating well finds her taste declining. Has a hospital bed but not in over lying mattress which she certainly needs. I am not clear what she has on her wheelchair Past medical history; type 2 diabetes with peripheral neuropathy but a last hemoglobin A1c of 6.7 on 11/30, gout, hypertension, weakness, gait decline, osteoarthritis, blind in the right eye. Notable to have very poor oral intake 2/9; patient arrives with the wound in her sacrum. This apparently has improved via our intake nurses measurements. We have been using Santyl. Notable that she has a very shallow stage II wound on the right heel tip which is a new wound this visit 08/09/2019; this is a patient I admitted to the clinic roughly 6 weeks ago. At that point she had a small area on her lower sacrum and had an area on her right plantar heel. I am not quite sure why she has not been here in so long. She has been using silver collagen. The area on  the lower sacrum is better. The area on the right heel is healed 3/30; the area on the right heel remains closed. She still has a small wound on the sacrum which is shallow. We're using collagen and foam Electronic Signature(s) Signed: 09/10/2019 7:44:34 AM By: Linton Ham MD Entered By: Linton Ham on 09/09/2019 07:18:34 -------------------------------------------------------------------------------- Physical Exam Details Patient Name: Date of Service: Holly Banks, Holly Banks 09/04/2019 3:15 PM Medical Record [id number] Patient Account Number: [id number] Date of Birth/Sex: Treating RN: Dec 14, 1928 (86 y.o. Orvan Falconer Primary Care Provider: Cassandria Anger Other Clinician: Referring Provider: Treating Provider/Extender:Jami Bogdanski, Letitia Neri, Vivi Ferns in Treatment: 9 Constitutional Patient is hypertensive.. Pulse regular and within target range for patient.[name] Respirations regular, non-labored and within target range.. Temperature is normal and within the target range for the patient.[name] Appears in no distress. Notes exam; the area questions on the lower sacrum. This wound has contracted somewhat. Not as much depth as last time. There is no surrounding infection. No debridement was necessary. There is no additional wounds seen Electronic Signature(s) Signed: 09/10/2019 7:44:34 AM By: Linton Ham MD Entered By: Linton Ham on 09/09/2019 07:21:50 -------------------------------------------------------------------------------- Physician Orders Details Patient Name: Date of Service: Holly Banks, Holly Banks 09/04/2019 3:15 PM Medical Record [id number] Patient Account Number: [id number] Date of Birth/Sex: Treating RN: [birth year] (86 y.o. Orvan Falconer Primary Care Provider: Cassandria Anger Other Clinician: Referring Provider: Treating Provider/Extender:Jesyka Slaght, Letitia Neri, Vivi Ferns in Treatment: 9 Verbal / Phone Orders: No Diagnosis  Coding ICD-10 Coding Code Description L89.150 Pressure ulcer of sacral region, unstageable R26.89 Other abnormalities of gait and mobility E11.622 Type 2  diabetes mellitus with other skin ulcer L89.612 Pressure ulcer of right heel, stage 2 Follow-up Appointments Return appointment in 1 month. Dressing Change Frequency Wound #1 Sacrum Change Dressing every other day. - home health to change three times a week. all other days family member to change. Skin Barriers/Peri-Wound Care Wound #1 Sacrum Skin Prep Wound Cleansing Wound #1 Sacrum Clean wound with Wound Cleanser Primary Wound Dressing Wound #1 Sacrum Collagen - moisten with normal saline or hydrogel Secondary Dressing Wound #1 Sacrum ABD pad - or foam border Off-Loading Low air-loss mattress (Group 2) - once arrives patient to use. Gel wheelchair cushion - continue to use specialty wheelchair cushion. Turn and reposition every 2 hours - use pillow while sitting and laying to offload pressure in chair and bed. Additional Orders / Instructions Follow Nutritious Diet - patient to increase protein and vitamins via food. patient to try to take in more calories may drink ensure or glucerna. [place] for wound care. [facility]. Electronic Signature(s) Signed: 09/05/2019 5:40:01 PM By: Carlene Coria RN Signed: 09/10/2019 7:44:34 AM By: Linton Ham MD Entered By: Carlene Coria on 09/05/2019 09:15:42 -------------------------------------------------------------------------------- Problem List Details Patient Name: Date of Service: Holly Banks, Holly Banks 09/04/2019 3:15 PM Medical Record [id number] Patient Account Number: [id number] Date of Birth/Sex: Treating RN: 06-21-1928 (86 y.o. Orvan Falconer Primary Care Provider: Cassandria Anger Other Clinician: Referring Provider: Treating Provider/Extender:Goran Olden, Letitia Neri, Vivi Ferns in Treatment: 9 Active  Problems ICD-10 Evaluated Encounter Code Description Active Date Today Diagnosis L89.150 Pressure ulcer of sacral region, unstageable 06/28/2019 No Yes R26.89 Other abnormalities of gait and mobility 06/28/2019 No Yes E11.622 Type 2 diabetes mellitus with other skin ulcer 06/28/2019 No Yes L89.612 Pressure ulcer of right heel, stage 2 07/17/2019 No Yes Inactive Problems Resolved Problems Electronic Signature(s) Signed: 09/10/2019 7:44:34 AM By: Linton Ham MD Previous Signature: 09/05/2019 5:40:01 PM Version By: Carlene Coria RN Entered By: Linton Ham on 09/09/2019 07:17:04 -------------------------------------------------------------------------------- Progress Note Details Patient Name: Date of Service: Holly Banks, Holly Banks 09/04/2019 3:15 PM Medical Record [id number] Patient Account Number: [id number] Date of Birth/Sex: Treating RN: Jul 02, 1928 (86 y.o. Orvan Falconer Primary Care Provider: Cassandria Anger Other Clinician: Referring Provider: Treating Provider/Extender:Devlon Dosher, Letitia Neri, Vivi Ferns in Treatment: 9 Subjective History of Present Illness (HPI) Admission 06/28/2019 This is a frail 86 year old woman who arrives with a 1-1/2 to 21-month history with an area on her lower sacrum. She lives at home relatively immobile secondary to diabetic neuropathy, osteoarthritis and gait and balance issues. She was seen by telemetry health however by her primary doctor in October and was noted to have a left buttock stage II. They were applying Tegaderm to this for a while but that did not help she is now just using dry gauze after cleaning the wound. She apparently is not eating well finds her taste declining. Has a hospital bed but not in over lying mattress which she certainly needs. I am not clear what she has on her wheelchair Past medical history; type 2 diabetes with peripheral neuropathy but a last hemoglobin A1c of 6.7 on 11/30, gout, hypertension,  weakness, gait decline, osteoarthritis, blind in the right eye. Notable to have very poor oral intake 2/9; patient arrives with the wound in her sacrum. This apparently has improved via our intake nurses measurements. We have been using Santyl. Notable that she has a very shallow stage II wound on the right heel tip which is a new wound this visit  08/09/2019; this is a patient I admitted to the clinic roughly 6 weeks ago. At that point she had a small area on her lower sacrum and had an area on her right plantar heel. I am not quite sure why she has not been here in so long. She has been using silver collagen. The area on the lower sacrum is better. The area on the right heel is healed 3/30; the area on the right heel remains closed. She still has a small wound on the sacrum which is shallow. We're using collagen and foam Objective Constitutional Patient is hypertensive.. Pulse regular and within target range for patient.[name] Respirations regular, non-labored and within target range.. Temperature is normal and within the target range for the patient.[name] Appears in no distress. Vitals Time Taken: 3:59 PM, Height: 62 in, Weight: 210 lbs, BMI: 38.4, Temperature: 98.6 F, Pulse: 62 bpm, Respiratory Rate: 20 breaths/min, Blood Pressure: 148/72 mmHg. General Notes: exam; the area questions on the lower sacrum. This wound has contracted somewhat. Not as much depth as last time. There is no surrounding infection. No debridement was necessary. There is no additional wounds seen Integumentary (Hair, Skin) Wound #1 status is Open. Original cause of wound was Pressure Injury. The wound is located on the Sacrum. The wound measures 0.7cm length x 0.4cm width x 0.4cm depth; 0.22cm^2 area and 0.088cm^3 volume. There is Fat Layer (Subcutaneous Tissue) Exposed exposed. There is no tunneling or undermining noted. There is a medium amount of serosanguineous drainage noted. The wound margin is well defined and not  attached to the wound base. There is large (67-100%) red granulation within the wound bed. There is no necrotic tissue within the wound bed. Assessment Active Problems ICD-10 Pressure ulcer of sacral region, unstageable Other abnormalities of gait and mobility Type 2 diabetes mellitus with other skin ulcer Pressure ulcer of right heel, stage 2 Plan Follow-up Appointments: Return appointment in 1 month. Dressing Change Frequency: Wound #1 Sacrum: Change Dressing every other day. - home health to change three times a week. all other days family member to change. Skin Barriers/Peri-Wound Care: Wound #1 Sacrum: Skin Prep Wound Cleansing: Wound #1 Sacrum: Clean wound with Wound Cleanser Primary Wound Dressing: Wound #1 Sacrum: Collagen - moisten with normal saline or hydrogel Secondary Dressing: Wound #1 Sacrum: ABD pad - or foam border Off-Loading: Low air-loss mattress (Group 2) - once arrives patient to use. Gel wheelchair cushion - continue to use specialty wheelchair cushion. Turn and reposition every 2 hours - use pillow while sitting and laying to offload pressure in chair and bed. Additional Orders / Instructions: Follow Nutritious Diet - patient to increase protein and vitamins via food. patient to try to take in more calories may drink ensure or glucerna. Home Health: [facility] for wound care. [facility]. #1 quite a bit of improvement in the depth of this wound since last visit currently at 0.4 cm. #2 there is still quite a bit of relative depth compared to surface area. Sometimes wounds like this are difficult to close nevertheless today was a good improvement #3 cautioned to offload this area. We'll continue with the dressing she's been doing which is moistened silver collagen with ABD or border foam Electronic Signature(s) Signed: 09/10/2019 7:44:34 AM By: Linton Ham MD Entered By: Linton Ham on 09/09/2019  07:28:02 -------------------------------------------------------------------------------- SuperBill Details Patient Name: Date of Service: Holly Banks, Holly Banks 09/04/2019 Medical Record [id number] Patient Account Number: [id number] Date of Birth/Sex: Treating RN: [birth year] (86 y.o.  Orvan Falconer Primary Care Provider: Cassandria Anger Other Clinician: Referring Provider: Treating Provider/Extender:Dezi Schaner, Letitia Neri, Vivi Ferns in Treatment: 9 Diagnosis Coding ICD-10 Codes Code Description L89.150 Pressure ulcer of sacral region, unstageable R26.89 Other abnormalities of gait and mobility E11.622 Type 2 diabetes mellitus with other skin ulcer L89.612 Pressure ulcer of right heel, stage 2 Facility Procedures The patient participates with Medicare or their insurance follows the Medicare Facility Guidelines: CPT4 Code Description Modifier Quantity [id number] [place] VISIT-LEV 3 EST PT 1 Physician Procedures Electronic Signature(s) Signed: 09/10/2019 7:44:34 AM By: Linton Ham MD Previous Signature: 09/05/2019 5:40:01 PM Version By: Carlene Coria RN Entered By: Linton Ham on 09/09/2019 ID:[id number]

## 2019-10-02 ENCOUNTER — Encounter (HOSPITAL_BASED_OUTPATIENT_CLINIC_OR_DEPARTMENT_OTHER): Payer: Medicare HMO | Admitting: Internal Medicine

## 2022-12-24 ENCOUNTER — Telehealth: Payer: Self-pay | Admitting: Internal Medicine

## 2022-12-24 NOTE — Telephone Encounter (Signed)
Patient's daughter called to ask if you would be willing to take her back as a new patient - she is back in the area a would like to be back under your care.  Call back [place] [phone]

## 2022-12-28 NOTE — Telephone Encounter (Signed)
Patient's daughter Judeth Cornfield called to check on the status of her request. The patient used to see Dr. Posey Rea and would like to see him again now that she's moved back to [facility]. Best callback for Judeth Cornfield is [phone].

## 2022-12-30 NOTE — Telephone Encounter (Signed)
Okay. Thank you.

## 2023-02-19 ENCOUNTER — Other Ambulatory Visit: Payer: Self-pay

## 2023-02-19 ENCOUNTER — Emergency Department (HOSPITAL_BASED_OUTPATIENT_CLINIC_OR_DEPARTMENT_OTHER): Payer: Medicare PPO

## 2023-02-19 ENCOUNTER — Encounter (HOSPITAL_BASED_OUTPATIENT_CLINIC_OR_DEPARTMENT_OTHER): Payer: Self-pay

## 2023-02-19 ENCOUNTER — Inpatient Hospital Stay (HOSPITAL_BASED_OUTPATIENT_CLINIC_OR_DEPARTMENT_OTHER)
Admission: EM | Admit: 2023-02-19 | Discharge: 2023-02-24 | DRG: 178 | Disposition: A | Discharge status: Home Health | Payer: Medicare PPO | Attending: Internal Medicine | Admitting: Internal Medicine

## 2023-02-19 DIAGNOSIS — N3000 Acute cystitis without hematuria: Secondary | ICD-10-CM

## 2023-02-19 DIAGNOSIS — E119 Type 2 diabetes mellitus without complications: Secondary | ICD-10-CM | POA: Diagnosis present

## 2023-02-19 DIAGNOSIS — R059 Cough, unspecified: Secondary | ICD-10-CM | POA: Diagnosis not present

## 2023-02-19 DIAGNOSIS — N3281 Overactive bladder: Secondary | ICD-10-CM | POA: Diagnosis present

## 2023-02-19 DIAGNOSIS — Z7401 Bed confinement status: Secondary | ICD-10-CM

## 2023-02-19 DIAGNOSIS — R32 Unspecified urinary incontinence: Secondary | ICD-10-CM | POA: Diagnosis present

## 2023-02-19 DIAGNOSIS — M109 Gout, unspecified: Secondary | ICD-10-CM | POA: Diagnosis present

## 2023-02-19 DIAGNOSIS — Z833 Family history of diabetes mellitus: Secondary | ICD-10-CM

## 2023-02-19 DIAGNOSIS — K219 Gastro-esophageal reflux disease without esophagitis: Secondary | ICD-10-CM | POA: Diagnosis present

## 2023-02-19 DIAGNOSIS — B9689 Other specified bacterial agents as the cause of diseases classified elsewhere: Secondary | ICD-10-CM | POA: Diagnosis present

## 2023-02-19 DIAGNOSIS — Z7984 Long term (current) use of oral hypoglycemic drugs: Secondary | ICD-10-CM

## 2023-02-19 DIAGNOSIS — R600 Localized edema: Secondary | ICD-10-CM

## 2023-02-19 DIAGNOSIS — U071 COVID-19: Principal | ICD-10-CM | POA: Diagnosis present

## 2023-02-19 DIAGNOSIS — Z96643 Presence of artificial hip joint, bilateral: Secondary | ICD-10-CM | POA: Diagnosis present

## 2023-02-19 DIAGNOSIS — D649 Anemia, unspecified: Secondary | ICD-10-CM | POA: Diagnosis present

## 2023-02-19 DIAGNOSIS — Z880 Allergy status to penicillin: Secondary | ICD-10-CM

## 2023-02-19 DIAGNOSIS — Z79899 Other long term (current) drug therapy: Secondary | ICD-10-CM

## 2023-02-19 DIAGNOSIS — M1990 Unspecified osteoarthritis, unspecified site: Secondary | ICD-10-CM | POA: Diagnosis present

## 2023-02-19 DIAGNOSIS — R531 Weakness: Principal | ICD-10-CM

## 2023-02-19 DIAGNOSIS — N390 Urinary tract infection, site not specified: Secondary | ICD-10-CM | POA: Diagnosis present

## 2023-02-19 DIAGNOSIS — Z888 Allergy status to other drugs, medicaments and biological substances status: Secondary | ICD-10-CM

## 2023-02-19 DIAGNOSIS — Z8619 Personal history of other infectious and parasitic diseases: Secondary | ICD-10-CM

## 2023-02-19 DIAGNOSIS — Z993 Dependence on wheelchair: Secondary | ICD-10-CM

## 2023-02-19 DIAGNOSIS — Z7982 Long term (current) use of aspirin: Secondary | ICD-10-CM

## 2023-02-19 DIAGNOSIS — L89152 Pressure ulcer of sacral region, stage 2: Secondary | ICD-10-CM | POA: Diagnosis present

## 2023-02-19 DIAGNOSIS — E876 Hypokalemia: Secondary | ICD-10-CM | POA: Diagnosis present

## 2023-02-19 DIAGNOSIS — E559 Vitamin D deficiency, unspecified: Secondary | ICD-10-CM | POA: Diagnosis present

## 2023-02-19 DIAGNOSIS — I10 Essential (primary) hypertension: Secondary | ICD-10-CM | POA: Diagnosis present

## 2023-02-19 DIAGNOSIS — Z87891 Personal history of nicotine dependence: Secondary | ICD-10-CM

## 2023-02-19 DIAGNOSIS — K2970 Gastritis, unspecified, without bleeding: Secondary | ICD-10-CM | POA: Diagnosis present

## 2023-02-19 DIAGNOSIS — Z7409 Other reduced mobility: Secondary | ICD-10-CM | POA: Diagnosis present

## 2023-02-19 LAB — CBC WITH DIFFERENTIAL/PLATELET
Abs Immature Granulocytes: 0.01 10*3/uL (ref 0.00–0.07)
Basophils Absolute: 0 10*3/uL (ref 0.0–0.1)
Basophils Relative: 0 %
Eosinophils Absolute: 0.1 10*3/uL (ref 0.0–0.5)
Eosinophils Relative: 1 %
HCT: 29.9 % — ABNORMAL LOW (ref 36.0–46.0)
Hemoglobin: 9.5 g/dL — ABNORMAL LOW (ref 12.0–15.0)
Immature Granulocytes: 0 %
Lymphocytes Relative: 28 %
Lymphs Abs: 1.7 10*3/uL (ref 0.7–4.0)
MCH: 25.4 pg — ABNORMAL LOW (ref 26.0–34.0)
MCHC: 31.8 g/dL (ref 30.0–36.0)
MCV: 79.9 fL — ABNORMAL LOW (ref 80.0–100.0)
Monocytes Absolute: 0.7 10*3/uL (ref 0.1–1.0)
Monocytes Relative: 11 %
Neutro Abs: 3.8 10*3/uL (ref 1.7–7.7)
Neutrophils Relative %: 60 %
Platelets: 205 10*3/uL (ref 150–400)
RBC: 3.74 MIL/uL — ABNORMAL LOW (ref 3.87–5.11)
RDW: 13.3 % (ref 11.5–15.5)
WBC: 6.3 10*3/uL (ref 4.0–10.5)
nRBC: 0 % (ref 0.0–0.2)

## 2023-02-19 LAB — URINALYSIS, W/ REFLEX TO CULTURE (INFECTION SUSPECTED)
Bilirubin Urine: NEGATIVE
Glucose, UA: NEGATIVE mg/dL
Ketones, ur: NEGATIVE mg/dL
Leukocytes,Ua: NEGATIVE
Nitrite: POSITIVE — AB
Protein, ur: 30 mg/dL — AB
Specific Gravity, Urine: 1.015 (ref 1.005–1.030)
pH: 7 (ref 5.0–8.0)

## 2023-02-19 LAB — COMPREHENSIVE METABOLIC PANEL WITH GFR
ALT: 7 U/L (ref 0–44)
AST: 15 U/L (ref 15–41)
Albumin: 3.5 g/dL (ref 3.5–5.0)
Alkaline Phosphatase: 55 U/L (ref 38–126)
Anion gap: 8 (ref 5–15)
BUN: 17 mg/dL (ref 8–23)
CO2: 29 mmol/L (ref 22–32)
Calcium: 9 mg/dL (ref 8.9–10.3)
Chloride: 102 mmol/L (ref 98–111)
Creatinine, Ser: 0.96 mg/dL (ref 0.44–1.00)
GFR, Estimated: 56 mL/min — ABNORMAL LOW (ref >60)
Glucose, Bld: 187 mg/dL — ABNORMAL HIGH (ref 70–99)
Potassium: 4 mmol/L (ref 3.5–5.1)
Sodium: 139 mmol/L (ref 135–145)
Total Bilirubin: 0.3 mg/dL (ref 0.3–1.2)
Total Protein: 7.2 g/dL (ref 6.5–8.1)

## 2023-02-19 LAB — RESP PANEL BY RT-PCR (RSV, FLU A&B, COVID)  RVPGX2
Influenza A by PCR: NEGATIVE
Influenza B by PCR: NEGATIVE
Resp Syncytial Virus by PCR: NEGATIVE
SARS Coronavirus 2 by RT PCR: POSITIVE — AB

## 2023-02-19 LAB — LACTIC ACID, PLASMA: Lactic Acid, Venous: 1.4 mmol/L (ref 0.5–1.9)

## 2023-02-19 MED ORDER — SODIUM CHLORIDE 0.9 % IV SOLN
1.0000 g | Freq: Once | INTRAVENOUS | Status: AC
  Administered 2023-02-19: 1 g via INTRAVENOUS
  Filled 2023-02-19: qty 10

## 2023-02-19 MED ORDER — ACETAMINOPHEN 325 MG PO TABS
650.0000 mg | ORAL_TABLET | Freq: Once | ORAL | Status: AC
  Administered 2023-02-19: 650 mg via ORAL
  Filled 2023-02-19: qty 2

## 2023-02-19 NOTE — ED Provider Notes (Signed)
11:03 PM Assumed care from Dr. Silverio Lay, please see their note for full history, physical and decision making until this point. In brief this is a 89 y.o. year old female who presented to the ED tonight with No chief complaint on file.     Here with worsening generalized weakness for 3 days. [place]x [place] recently but never started antibiotics. Pending labs/workup.   Urine likely positive, Rocephin already ordered.   COVID positive, consulting with pharmacist for recommendations.   Reexam, has mild cough, R>L leg edema. Generally weak appearing but no focal deficits. BP improving. Will hold on anti-hypertensives to avoid hypotension in the setting of infection and weakness. Rest of labs and workup near or at baseline. Will c/w hospitalist for admit.   Labs, studies and imaging reviewed by myself and considered in medical decision making if ordered. Imaging interpreted by radiology.  Labs Reviewed  CBC WITH DIFFERENTIAL/PLATELET - Abnormal; Notable for the following components:      Result Value   RBC 3.74 (*)    Hemoglobin 9.5 (*)    HCT 29.9 (*)    MCV 79.9 (*)    MCH 25.4 (*)    All other components within normal limits  CULTURE, BLOOD (ROUTINE X 2)  CULTURE, BLOOD (ROUTINE X 2)  RESP PANEL BY RT-PCR (RSV, FLU A&B, COVID)  RVPGX2  COMPREHENSIVE METABOLIC PANEL  LACTIC ACID, PLASMA  LACTIC ACID, PLASMA  URINALYSIS, W/ REFLEX TO CULTURE (INFECTION SUSPECTED)    DG Chest Port 1 View  Final Result      No follow-ups on file.    Marily Memos, MD 02/19/23 2325

## 2023-02-19 NOTE — ED Triage Notes (Signed)
Pt c/o URI symptoms "since I think a couple weeks ago." Family states that Wednesday night, cough onset, worsening "every day since."   No meds PTA except "riccola w cough syrup in the center."

## 2023-02-19 NOTE — ED Provider Notes (Signed)
Holly Banks EMERGENCY DEPARTMENT AT [facility] Provider Note   CSN: [id number] Arrival date & time: 02/19/23  2157     History  No chief complaint on file.   Holly Banks is a 89 y.o. female history of hypertension, bedbound with known sacral decub ulcer, diabetes, here presenting with shortness of breath and cough and fever.  Patient was diagnosed with UTI about a week ago.  However she never picked up her antibiotic.  For the last 3 days, patient has been having progressive worsening cough.  Patient also has low-grade temp at home.  Patient is usually bedbound but able to transfer but has not been able to transfer due to worsening weakness.  Patient lives at home with her daughter.  Denies any sick contacts  The history is provided by the patient.       Home Medications Prior to Admission medications   Medication Sig Start Date End Date Taking? Authorizing Provider  aspirin 81 MG tablet Take 81 mg by mouth daily. 08/11/13   [provider]  Cholecalciferol (EQL VITAMIN D3) 1000 UNITS tablet Take 1,000 Units by mouth daily. For supplement    [provider]  diclofenac sodium (VOLTAREN) 1 % GEL Apply 2 g topically 4 (four) times daily. 05/25/18   Myrlene Broker, MD  hydrOXYzine (ATARAX/VISTARIL) 10 MG tablet Take 1 tablet (10 mg total) by mouth 3 (three) times daily as needed for itching. 12/22/18   Plotnikov, Georgina Quint, MD  losartan (COZAAR) 50 MG tablet TAKE 1 TABLET EVERY DAY  FOR  HYPERTENSION 07/12/18   Plotnikov, Georgina Quint, MD  metFORMIN (GLUCOPHAGE) 500 MG tablet Take 1 tablet (500 mg total) by mouth 2 (two) times daily with a meal. Must keep scheduled appt for future refills 07/12/18   Plotnikov, Georgina Quint, MD  methylPREDNISolone (MEDROL DOSEPAK) 4 MG TBPK tablet As directed 07/12/18   Plotnikov, Georgina Quint, MD  NIFEdipine (ADALAT CC) 60 MG 24 hr tablet Take 1 tablet (60 mg total) by mouth daily. 10/11/18   Plotnikov, Georgina Quint, MD  Nutritional  Supplements (ENSURE ORIGINAL) LIQD Take 1-3 Bottles by mouth daily. 05/23/17   Etta Grandchild, MD  nystatin (MYCOSTATIN/NYSTOP) powder Apply to area twice a day as needed 01/30/19   Plotnikov, Georgina Quint, MD  potassium chloride (KLOR-CON) 10 MEQ tablet TAKE 1 TABLET EVERY DAY 05/04/19   Plotnikov, Georgina Quint, MD  primidone (MYSOLINE) 50 MG tablet Take 2 tablets (100 mg total) by mouth 3 (three) times daily. 10/11/18   Plotnikov, Georgina Quint, MD  repaglinide (PRANDIN) 1 MG tablet 1 tab ac qd-tid. Use Prandin (repaglinide) only if you eat 07/12/18   Plotnikov, Georgina Quint, MD  SANTYL ointment  06/28/19   [provider]  SSD 1 % cream APPLY TO AFFECTED AREA(S) EVERY DAY 06/06/19   Plotnikov, Georgina Quint, MD  tolterodine (DETROL LA) 4 MG 24 hr capsule Take 1 capsule (4 mg total) by mouth daily. 01/30/19   Plotnikov, Georgina Quint, MD  vitamin B-12 (CYANOCOBALAMIN) 1000 MCG tablet Take 1,000 mcg by mouth daily.      [provider]      Allergies    Ditropan [oxybutynin] and Penicillins    Review of Systems   Review of Systems  Constitutional:  Positive for chills and fever.  Respiratory:  Positive for cough.   All other systems reviewed and are negative.   Physical Exam Updated Vital Signs BP (!) 197/87   Pulse 74   Temp (!) 100.8 F (  38.2 C)   Resp 16   SpO2 98%  Physical Exam Vitals and nursing note reviewed.  Constitutional:      Comments: Patient is chronically ill  HENT:     Head: Normocephalic.     Nose: Nose normal.     Mouth/Throat:     Mouth: Mucous membranes are moist.  Eyes:     Extraocular Movements: Extraocular movements intact.     Pupils: Pupils are equal, round, and reactive to light.  Cardiovascular:     Rate and Rhythm: Normal rate and regular rhythm.     Pulses: Normal pulses.     Heart sounds: Normal heart sounds.  Pulmonary:     Comments: Diminished bilateral bases Abdominal:     General: Abdomen is flat.     Palpations: Abdomen is soft.   Genitourinary:    Comments: Stage 2 sacral decub ulcer with no obvious cellulitis  Musculoskeletal:        General: Normal range of motion.     Cervical back: Normal range of motion and neck supple.  Skin:    General: Skin is warm.     Capillary Refill: Capillary refill takes less than 2 seconds.  Neurological:     Mental Status: She is alert.     Comments: ANO x 2.  Patient is moving all extremities.  Patient is bedbound at baseline  Psychiatric:        Mood and Affect: Mood normal.        Behavior: Behavior normal.     ED Results / Procedures / Treatments   Labs (all labs ordered are listed, but only abnormal results are displayed) Labs Reviewed  CULTURE, BLOOD (ROUTINE X 2)  CULTURE, BLOOD (ROUTINE X 2)  RESP PANEL BY RT-PCR (RSV, FLU A&B, COVID)  RVPGX2  CBC WITH DIFFERENTIAL/PLATELET  COMPREHENSIVE METABOLIC PANEL  LACTIC ACID, PLASMA  LACTIC ACID, PLASMA  URINALYSIS, W/ REFLEX TO CULTURE (INFECTION SUSPECTED)    EKG None  Radiology No results found.  Procedures Procedures    Medications Ordered in ED Medications - No data to display  ED Course/ Medical Decision Making/ A&P                                 Medical Decision Making Holly Banks is a 89 y.o. female here presenting with cough and fever.  Concern for possible COVID versus pneumonia versus UTI.  Patient is bedbound with sacral decub ulcer.  Will do sepsis workup with CBC CMP and chest x-ray and COVID and UA.  11:04 PM I reviewed patient's CBC and white blood cell count is normal.  Urinalysis is pending.  I think patient likely has UTI causing her weakness.  Signed out to Dr. Clayborne Dana in the ED. Anticipate admission if she has UTI.   Amount and/or Complexity of Data Reviewed Labs: ordered. Radiology: ordered.  Risk OTC drugs.    Final Clinical Impression(s) / ED Diagnoses Final diagnoses:  None    Rx / DC Orders ED Discharge Orders     None         Holly Pander,  MD 02/19/23 [phone]

## 2023-02-19 NOTE — ED Notes (Signed)
Pt was seen last week by PCP and DX with UTI. Pt states she has NOT began Antibiotics that were prescribed.

## 2023-02-20 ENCOUNTER — Encounter (HOSPITAL_COMMUNITY): Payer: Self-pay

## 2023-02-20 DIAGNOSIS — R32 Unspecified urinary incontinence: Secondary | ICD-10-CM | POA: Diagnosis not present

## 2023-02-20 DIAGNOSIS — Z87891 Personal history of nicotine dependence: Secondary | ICD-10-CM | POA: Diagnosis not present

## 2023-02-20 DIAGNOSIS — Z833 Family history of diabetes mellitus: Secondary | ICD-10-CM | POA: Diagnosis not present

## 2023-02-20 DIAGNOSIS — K219 Gastro-esophageal reflux disease without esophagitis: Secondary | ICD-10-CM | POA: Diagnosis not present

## 2023-02-20 DIAGNOSIS — U071 COVID-19: Secondary | ICD-10-CM | POA: Diagnosis not present

## 2023-02-20 DIAGNOSIS — B9689 Other specified bacterial agents as the cause of diseases classified elsewhere: Secondary | ICD-10-CM | POA: Diagnosis not present

## 2023-02-20 DIAGNOSIS — Z79899 Other long term (current) drug therapy: Secondary | ICD-10-CM | POA: Diagnosis not present

## 2023-02-20 DIAGNOSIS — N390 Urinary tract infection, site not specified: Secondary | ICD-10-CM | POA: Diagnosis not present

## 2023-02-20 DIAGNOSIS — Z7401 Bed confinement status: Secondary | ICD-10-CM | POA: Diagnosis not present

## 2023-02-20 DIAGNOSIS — E876 Hypokalemia: Secondary | ICD-10-CM | POA: Diagnosis not present

## 2023-02-20 DIAGNOSIS — Z96643 Presence of artificial hip joint, bilateral: Secondary | ICD-10-CM | POA: Diagnosis not present

## 2023-02-20 DIAGNOSIS — Z880 Allergy status to penicillin: Secondary | ICD-10-CM | POA: Diagnosis not present

## 2023-02-20 DIAGNOSIS — I10 Essential (primary) hypertension: Secondary | ICD-10-CM | POA: Diagnosis not present

## 2023-02-20 DIAGNOSIS — N3281 Overactive bladder: Secondary | ICD-10-CM | POA: Diagnosis not present

## 2023-02-20 DIAGNOSIS — K2970 Gastritis, unspecified, without bleeding: Secondary | ICD-10-CM | POA: Diagnosis not present

## 2023-02-20 DIAGNOSIS — Z7409 Other reduced mobility: Secondary | ICD-10-CM | POA: Diagnosis not present

## 2023-02-20 DIAGNOSIS — R600 Localized edema: Secondary | ICD-10-CM

## 2023-02-20 DIAGNOSIS — E559 Vitamin D deficiency, unspecified: Secondary | ICD-10-CM | POA: Diagnosis not present

## 2023-02-20 DIAGNOSIS — D649 Anemia, unspecified: Secondary | ICD-10-CM

## 2023-02-20 DIAGNOSIS — Z993 Dependence on wheelchair: Secondary | ICD-10-CM | POA: Diagnosis not present

## 2023-02-20 DIAGNOSIS — E119 Type 2 diabetes mellitus without complications: Secondary | ICD-10-CM | POA: Diagnosis not present

## 2023-02-20 DIAGNOSIS — M1990 Unspecified osteoarthritis, unspecified site: Secondary | ICD-10-CM | POA: Diagnosis not present

## 2023-02-20 DIAGNOSIS — Z8619 Personal history of other infectious and parasitic diseases: Secondary | ICD-10-CM | POA: Diagnosis not present

## 2023-02-20 DIAGNOSIS — M109 Gout, unspecified: Secondary | ICD-10-CM | POA: Diagnosis not present

## 2023-02-20 DIAGNOSIS — R059 Cough, unspecified: Secondary | ICD-10-CM | POA: Diagnosis present

## 2023-02-20 DIAGNOSIS — L89152 Pressure ulcer of sacral region, stage 2: Secondary | ICD-10-CM | POA: Diagnosis not present

## 2023-02-20 LAB — GLUCOSE, CAPILLARY
Glucose-Capillary: 152 mg/dL — ABNORMAL HIGH (ref 70–99)
Glucose-Capillary: 109 mg/dL — ABNORMAL HIGH (ref 70–99)
Glucose-Capillary: 147 mg/dL — ABNORMAL HIGH (ref 70–99)
Glucose-Capillary: 111 mg/dL — ABNORMAL HIGH (ref 70–99)

## 2023-02-20 LAB — D-DIMER, QUANTITATIVE: D-Dimer, Quant: 2.53 ug{FEU}/mL — ABNORMAL HIGH (ref 0.00–0.50)

## 2023-02-20 LAB — LACTIC ACID, PLASMA: Lactic Acid, Venous: 1.2 mmol/L (ref 0.5–1.9)

## 2023-02-20 MED ORDER — HYDRALAZINE HCL 20 MG/ML IJ SOLN
5.0000 mg | Freq: Four times a day (QID) | INTRAMUSCULAR | Status: DC | PRN
  Administered 2023-02-23 (×2): 5 mg via INTRAVENOUS
  Filled 2023-02-20 (×3): qty 1

## 2023-02-20 MED ORDER — ACETAMINOPHEN 650 MG RE SUPP: 650.0000 mg | Freq: Four times a day (QID) | RECTAL | Status: DC | PRN

## 2023-02-20 MED ORDER — INSULIN ASPART 100 UNIT/ML IJ SOLN: 0.0000 [IU] | Freq: Every day | INTRAMUSCULAR | Status: DC

## 2023-02-20 MED ORDER — NIRMATRELVIR/RITONAVIR (PAXLOVID) TABLET (RENAL DOSING)
2.0000 | ORAL_TABLET | Freq: Two times a day (BID) | ORAL | Status: DC
  Administered 2023-02-20 – 2023-02-24 (×9): 2 via ORAL
  Filled 2023-02-20: qty 20

## 2023-02-20 MED ORDER — INSULIN ASPART 100 UNIT/ML IJ SOLN
0.0000 [IU] | Freq: Three times a day (TID) | INTRAMUSCULAR | Status: DC
  Administered 2023-02-20: 2 [IU] via SUBCUTANEOUS
  Administered 2023-02-20 – 2023-02-21 (×2): 1 [IU] via SUBCUTANEOUS
  Administered 2023-02-21: 2 [IU] via SUBCUTANEOUS
  Administered 2023-02-22 – 2023-02-24 (×4): 1 [IU] via SUBCUTANEOUS

## 2023-02-20 MED ORDER — GUAIFENESIN-DM 100-10 MG/5ML PO SYRP
5.0000 mL | ORAL_SOLUTION | ORAL | Status: DC | PRN
  Administered 2023-02-20 – 2023-02-21 (×2): 5 mL via ORAL
  Filled 2023-02-20 (×2): qty 10

## 2023-02-20 MED ORDER — MENTHOL 3 MG MT LOZG
1.0000 | LOZENGE | OROMUCOSAL | Status: DC | PRN
  Administered 2023-02-20: 3 mg via ORAL
  Filled 2023-02-20: qty 9

## 2023-02-20 MED ORDER — SODIUM CHLORIDE 0.9 % IV SOLN
1.0000 g | INTRAVENOUS | Status: DC
  Administered 2023-02-20 – 2023-02-21 (×2): 1 g via INTRAVENOUS
  Filled 2023-02-20 (×2): qty 10

## 2023-02-20 MED ORDER — ACETAMINOPHEN 325 MG PO TABS
650.0000 mg | ORAL_TABLET | Freq: Four times a day (QID) | ORAL | Status: DC | PRN
  Administered 2023-02-20 – 2023-02-23 (×4): 650 mg via ORAL
  Filled 2023-02-20 (×4): qty 2

## 2023-02-20 MED ORDER — ENOXAPARIN SODIUM 40 MG/0.4ML IJ SOSY
40.0000 mg | PREFILLED_SYRINGE | INTRAMUSCULAR | Status: DC
  Administered 2023-02-20: 40 mg via SUBCUTANEOUS
  Filled 2023-02-20: qty 0.4

## 2023-02-20 NOTE — Progress Notes (Signed)
Report received from [place], RN and care assumed once Pt arrived to [facility]. Pt is alert and oriented to self,place, and situation. Resp are even and unlabored. Cough is present. Pt on RA. [facility] paged and call returned that attending [place] MD is taking over for the PT and will assess, review, and give orders. Pt reports a sore throat. RN is waiting for orders.

## 2023-02-20 NOTE — Plan of Care (Signed)
  Problem: Skin Integrity: Goal: Risk for impaired skin integrity will decrease Outcome: Progressing   Problem: Safety: Goal: Ability to remain free from injury will improve Outcome: Progressing   Problem: Pain Managment: Goal: General experience of comfort will improve Outcome: Progressing   Problem: Elimination: Goal: Will not experience complications related to urinary retention Outcome: Progressing   Problem: Elimination: Goal: Will not experience complications related to bowel motility Outcome: Progressing   Problem: Coping: Goal: Level of anxiety will decrease Outcome: Progressing

## 2023-02-20 NOTE — ED Notes (Signed)
Pt report called to [place] RN verbalized complete understanding of Pt report and current condition, denies questions at this time.

## 2023-02-20 NOTE — Plan of Care (Signed)
?  Problem: Clinical Measurements: ?Goal: Respiratory complications will improve ?Outcome: Progressing ?  ?Problem: Activity: ?Goal: Risk for activity intolerance will decrease ?Outcome: Progressing ?  ?Problem: Elimination: ?Goal: Will not experience complications related to urinary retention ?Outcome: Progressing ?  ?

## 2023-02-20 NOTE — H&P (Signed)
History and Physical    Holly Banks LOV:[id number] DOB: [birth year] DOA: 02/19/2023  PCP: Tresa Garter, MD  Patient coming from: Home  Chief Complaint: Cough  HPI: Holly Banks is a 89 y.o. female with medical history significant of type 2 diabetes, GERD, gastritis, gout, hypertension, obesity, bedbound with chronic sacral decubitus ulcer, overactive bladder presented to ED with complaints of shortness of breath, cough, and fever.  She was diagnosed with UTI a week ago but never picked up her antibiotic from the pharmacy.  Temperature 100.8 F on arrival to the ED and hypertensive.  Not tachycardic, tachypneic, or hypoxic.  Labs showing no leukocytosis, hemoglobin 9.5 (at baseline), glucose 187, lactic acid normal x 2, blood cultures collected, SARS-CoV-2 PCR positive.  UA with positive nitrite, negative leukocytes, and microscopy showing 6-10 WBCs and few bacteria.  Urine culture pending.  Chest x-ray not suggestive of pneumonia. Patient was given Tylenol and ceftriaxone.    Patient states she is coughing a lot for the past few days and her throat feels sore. Cough is productive of clear sputum. She is also endorsing shortness of breath. Denies chest pain, fevers, chills, or bodyaches. She is not sure about her COVID vaccination status. She reports chronic urinary incontinence for which she wears diapers. Denies dysuria. Denies nausea, vomiting, abdominal pain, or diarrhea. No other complaints.   Review of Systems:  Review of Systems  All other systems reviewed and are negative.   Past Medical History:  Diagnosis Date   Diabetes mellitus    type II   Diverticulosis    Gastritis    w/ hx of h. pylori   GERD (gastroesophageal reflux disease)    Gout    Hyperkalemia    Hypertension    Leiomyoma 2-02   colonic polypoid    Morbid obesity (HCC)    Osteoarthritis    Pain in joint, lower leg    Unspecified vitamin D deficiency    Urinary incontinence     Past Surgical  History:  Procedure Laterality Date   CHOLECYSTECTOMY     LITHOTRIPSY     ORIF FEMUR FRACTURE N/A 07/12/2013   Procedure: OPEN REDUCTION INTERNAL FIXATION (ORIF) DISTAL FEMUR FRACTURE;  Surgeon: Velna Ochs, MD;  Location: [facility] OR;  Service: Orthopedics;  Laterality: N/A;   right tkr     thr left     thr left 3/4     TOTAL HIP ARTHROPLASTY  2009   right   TOTAL HIP ARTHROPLASTY     left     reports that she quit smoking about 62 years ago. She has never used smokeless tobacco. She reports that she does not drink alcohol and does not use drugs.  Allergies  Allergen Reactions   Ditropan [Oxybutynin]     Dry mouth   Penicillins     Rash    Family History  Problem Relation Age of Onset   Cancer Sister        breast, stomach   Diabetes Sister    Diabetes Other    Colon cancer Neg Hx    Stomach cancer Neg Hx    Esophageal cancer Neg Hx     Prior to Admission medications   Medication Sig Start Date End Date Taking? Authorizing Provider  aspirin 81 MG tablet Take 81 mg by mouth daily. 08/11/13   [provider]  Cholecalciferol (EQL VITAMIN D3) 1000 UNITS tablet Take 1,000 Units by mouth daily. For supplement    [provider]  diclofenac  sodium (VOLTAREN) 1 % GEL Apply 2 g topically 4 (four) times daily. 05/25/18   Myrlene Broker, MD  hydrOXYzine (ATARAX/VISTARIL) 10 MG tablet Take 1 tablet (10 mg total) by mouth 3 (three) times daily as needed for itching. 12/22/18   Plotnikov, Georgina Quint, MD  losartan (COZAAR) 50 MG tablet TAKE 1 TABLET EVERY DAY  FOR  HYPERTENSION 07/12/18   Plotnikov, Georgina Quint, MD  metFORMIN (GLUCOPHAGE) 500 MG tablet Take 1 tablet (500 mg total) by mouth 2 (two) times daily with a meal. Must keep scheduled appt for future refills 07/12/18   Plotnikov, Georgina Quint, MD  methylPREDNISolone (MEDROL DOSEPAK) 4 MG TBPK tablet As directed 07/12/18   Plotnikov, Georgina Quint, MD  NIFEdipine (ADALAT CC) 60 MG 24 hr tablet Take 1 tablet (60 mg total) by  mouth daily. 10/11/18   Plotnikov, Georgina Quint, MD  Nutritional Supplements (ENSURE ORIGINAL) LIQD Take 1-3 Bottles by mouth daily. 05/23/17   Etta Grandchild, MD  nystatin (MYCOSTATIN/NYSTOP) powder Apply to area twice a day as needed 01/30/19   Plotnikov, Georgina Quint, MD  potassium chloride (KLOR-CON) 10 MEQ tablet TAKE 1 TABLET EVERY DAY 05/04/19   Plotnikov, Georgina Quint, MD  primidone (MYSOLINE) 50 MG tablet Take 2 tablets (100 mg total) by mouth 3 (three) times daily. 10/11/18   Plotnikov, Georgina Quint, MD  repaglinide (PRANDIN) 1 MG tablet 1 tab ac qd-tid. Use Prandin (repaglinide) only if you eat 07/12/18   Plotnikov, Georgina Quint, MD  SANTYL ointment  06/28/19   [provider]  SSD 1 % cream APPLY TO AFFECTED AREA(S) EVERY DAY 06/06/19   Plotnikov, Georgina Quint, MD  tolterodine (DETROL LA) 4 MG 24 hr capsule Take 1 capsule (4 mg total) by mouth daily. 01/30/19   Plotnikov, Georgina Quint, MD  vitamin B-12 (CYANOCOBALAMIN) 1000 MCG tablet Take 1,000 mcg by mouth daily.      [provider]    Physical Exam: Vitals:   02/20/23 0000 02/20/23 0208 02/20/23 0224 02/20/23 0250  BP: (!) 168/78 (!) 169/89 (!) 169/89 (!) 169/89  Pulse: 79 72 72 72  Resp: 18 17 18 18   Temp: 98.9 F (37.2 C) 99.4 F (37.4 C) 99.4 F (37.4 C) 99.4 F (37.4 C)  TempSrc: Oral Oral Oral Oral  SpO2: 97% 99% 97%   Weight:    72.8 kg  Height:    5' 3.78" (1.62 m)    Physical Exam Vitals reviewed.  Constitutional:      General: She is not in acute distress. HENT:     Head: Normocephalic and atraumatic.  Eyes:     Extraocular Movements: Extraocular movements intact.  Cardiovascular:     Rate and Rhythm: Normal rate and regular rhythm.     Pulses: Normal pulses.  Pulmonary:     Effort: Pulmonary effort is normal. No respiratory distress.     Breath sounds: Normal breath sounds. No wheezing or rales.  Abdominal:     General: Bowel sounds are normal. There is no distension.     Palpations: Abdomen is soft.      Tenderness: There is no abdominal tenderness.  Musculoskeletal:     Cervical back: Normal range of motion.     Right lower leg: Edema present.     Left lower leg: No edema.  Skin:    General: Skin is warm and dry.  Neurological:     General: No focal deficit present.     Mental Status: She is alert and oriented to  person, place, and time.     Labs on Admission: I have personally reviewed following labs and imaging studies  CBC: Recent Labs  Lab 02/19/23 2223  WBC 6.3  NEUTROABS 3.8  HGB 9.5*  HCT 29.9*  MCV 79.9*  PLT 205   Basic Metabolic Panel: Recent Labs  Lab 02/19/23 2223  NA 139  K 4.0  CL 102  CO2 29  GLUCOSE 187*  BUN 17  CREATININE 0.96  CALCIUM 9.0   GFR: Estimated Creatinine Clearance: 36.4 mL/min (by C-G formula based on SCr of 0.96 mg/dL). Liver Function Tests: Recent Labs  Lab 02/19/23 2223  AST 15  ALT 7  ALKPHOS 55  BILITOT 0.3  PROT 7.2  ALBUMIN 3.5   No results for input(s): "LIPASE", "AMYLASE" in the last 168 hours. No results for input(s): "AMMONIA" in the last 168 hours. Coagulation Profile: No results for input(s): "INR", "PROTIME" in the last 168 hours. Cardiac Enzymes: No results for input(s): "CKTOTAL", "CKMB", "CKMBINDEX", "TROPONINI" in the last 168 hours. BNP (last 3 results) No results for input(s): "PROBNP" in the last 8760 hours. HbA1C: No results for input(s): "HGBA1C" in the last 72 hours. CBG: No results for input(s): "GLUCAP" in the last 168 hours. Lipid Profile: No results for input(s): "CHOL", "HDL", "LDLCALC", "TRIG", "CHOLHDL", "LDLDIRECT" in the last 72 hours. Thyroid Function Tests: No results for input(s): "TSH", "T4TOTAL", "FREET4", "T3FREE", "THYROIDAB" in the last 72 hours. Anemia Panel: No results for input(s): "VITAMINB12", "FOLATE", "FERRITIN", "TIBC", "IRON", "RETICCTPCT" in the last 72 hours. Urine analysis:    Component Value Date/Time   COLORURINE YELLOW 02/19/2023 2223   APPEARANCEUR CLEAR  02/19/2023 2223   LABSPEC 1.015 02/19/2023 2223   PHURINE 7.0 02/19/2023 2223   GLUCOSEU NEGATIVE 02/19/2023 2223   GLUCOSEU NEGATIVE 08/23/2016 1619   HGBUR TRACE (A) 02/19/2023 2223   BILIRUBINUR NEGATIVE 02/19/2023 2223   KETONESUR NEGATIVE 02/19/2023 2223   PROTEINUR 30 (A) 02/19/2023 2223   UROBILINOGEN 0.2 08/23/2016 1619   NITRITE POSITIVE (A) 02/19/2023 2223   LEUKOCYTESUR NEGATIVE 02/19/2023 2223    Radiological Exams on Admission: DG Chest Port 1 View  Result Date: 02/19/2023 CLINICAL DATA:  Fever and cough EXAM: PORTABLE CHEST 1 VIEW COMPARISON:  Radiograph 11/12/2013 FINDINGS: Stable cardiomediastinal silhouette. Aortic atherosclerotic calcification. Left basilar atelectasis. Trace pleural effusions or pleural thickening. No pneumothorax. No displaced rib fractures. Advanced arthritis both shoulders. IMPRESSION: No active disease. Electronically Signed   By: Minerva Fester M.D.   On: 02/19/2023 22:33    Assessment and Plan  COVID-19 infection Patient is presenting with complaints of shortness of breath and cough.  Febrile with temperature 100.8 F on arrival to the ED but no signs of sepsis.  SARS-CoV-2 PCR positive.  Chest x-ray not suggestive of pneumonia.  Start Paxlovid.  No indication for steroids at this time as patient is not hypoxic.  Robitussin-DM as needed for cough.  Airborne and contact precautions.  Blood cultures pending.   Unilateral right lower extremity edema Per patient this is chronic problem.  She is not endorsing any pain.  Check D-dimer, if elevated, right lower extremity Doppler to rule out DVT.  PE less likely given no tachycardia, hypoxia, or chest pain.   Possible UTI UA with positive nitrite, negative leukocytes, and microscopy showing 6-10 WBCs and few bacteria.  Patient denies dysuria.  She has chronic urinary incontinence issues due to history of overactive bladder.  No leukocytosis or signs of sepsis.  Continue ceftriaxone.  Urine culture  pending.  Chronic anemia Hemoglobin at baseline.   Non-insulin-dependent type 2 diabetes Last A1c 6.7 in November 2020, will repeat.  Sensitive sliding scale insulin ACHS.   Hypertension Systolic currently in the 160s.  Pharmacy med rec pending.  IV hydralazine PRN.   GERD/gastritis Gout Overactive bladder Pharmacy med rec pending.  DVT prophylaxis: Lovenox Code Status: Full Code (discussed with the patient) Level of care: Med-Surg Admission status: It is my clinical opinion that referral for OBSERVATION is reasonable and necessary in this patient based on the above information provided. The aforementioned taken together are felt to place the patient at high risk for further clinical deterioration. However, it is anticipated that the patient may be medically stable for discharge from the hospital within 24 to 48 hours.  John Giovanni MD [facility]  If 7PM-7AM, please contact night-coverage [email or web address]  02/20/2023, 5:20 AM

## 2023-02-20 NOTE — Progress Notes (Signed)
PHARMACY NOTE:  ANTIMICROBIAL RENAL DOSAGE ADJUSTMENT  Current antimicrobial regimen includes a mismatch between antimicrobial dosage and estimated renal function.  As per policy approved by the Pharmacy & Therapeutics and Medical Executive Committees, the antimicrobial dosage will be adjusted accordingly.  Current antimicrobial dosage:  Paxlovid 3 tablets BID  Indication: COVID +  Renal Function:  Estimated Creatinine Clearance: 36.4 mL/min (by C-G formula based on SCr of 0.96 mg/dL). []      On intermittent HD, scheduled: []      On CRRT    Antimicrobial dosage has been changed to:  Paxlovid 2 tablets BID for GFR 30-60 ml/min  Additional comments:   Thank you for allowing pharmacy to be a part of this patient's care.  Junita Push, [facility] 02/20/2023 5:30 AM

## 2023-02-20 NOTE — Progress Notes (Signed)
Admitted this morning due to COVID-19 viral infection.  The patient was seen and examined at bedside.  She is alert and oriented x 2.  Denies having a productive cough.  States the cough is dry.  Denies having any chest pain or shortness of breath at rest.  She states she is wheelchair-bound.  Currently on Rocephin for presumptive UTI and on Paxlovid for COVID-19 viral infection.  Patient had complained of unilateral right lower extremity edema, D-dimer returned elevated greater than 2.  Will obtain right lower extremity Doppler ultrasound to rule out DVT.  Time: 15 minutes.

## 2023-02-21 ENCOUNTER — Observation Stay (HOSPITAL_COMMUNITY): Payer: Medicare PPO

## 2023-02-21 DIAGNOSIS — E876 Hypokalemia: Secondary | ICD-10-CM | POA: Diagnosis present

## 2023-02-21 DIAGNOSIS — R32 Unspecified urinary incontinence: Secondary | ICD-10-CM | POA: Diagnosis present

## 2023-02-21 DIAGNOSIS — I10 Essential (primary) hypertension: Secondary | ICD-10-CM | POA: Diagnosis present

## 2023-02-21 DIAGNOSIS — Z993 Dependence on wheelchair: Secondary | ICD-10-CM | POA: Diagnosis not present

## 2023-02-21 DIAGNOSIS — Z79899 Other long term (current) drug therapy: Secondary | ICD-10-CM | POA: Diagnosis not present

## 2023-02-21 DIAGNOSIS — N3281 Overactive bladder: Secondary | ICD-10-CM | POA: Diagnosis present

## 2023-02-21 DIAGNOSIS — U071 COVID-19: Secondary | ICD-10-CM | POA: Diagnosis present

## 2023-02-21 DIAGNOSIS — Z833 Family history of diabetes mellitus: Secondary | ICD-10-CM | POA: Diagnosis not present

## 2023-02-21 DIAGNOSIS — Z96643 Presence of artificial hip joint, bilateral: Secondary | ICD-10-CM | POA: Diagnosis present

## 2023-02-21 DIAGNOSIS — Z880 Allergy status to penicillin: Secondary | ICD-10-CM | POA: Diagnosis not present

## 2023-02-21 DIAGNOSIS — Z87891 Personal history of nicotine dependence: Secondary | ICD-10-CM | POA: Diagnosis not present

## 2023-02-21 DIAGNOSIS — L89152 Pressure ulcer of sacral region, stage 2: Secondary | ICD-10-CM | POA: Diagnosis present

## 2023-02-21 DIAGNOSIS — Z8619 Personal history of other infectious and parasitic diseases: Secondary | ICD-10-CM | POA: Diagnosis not present

## 2023-02-21 DIAGNOSIS — E559 Vitamin D deficiency, unspecified: Secondary | ICD-10-CM | POA: Diagnosis present

## 2023-02-21 DIAGNOSIS — R609 Edema, unspecified: Secondary | ICD-10-CM

## 2023-02-21 DIAGNOSIS — N390 Urinary tract infection, site not specified: Secondary | ICD-10-CM | POA: Diagnosis present

## 2023-02-21 DIAGNOSIS — K219 Gastro-esophageal reflux disease without esophagitis: Secondary | ICD-10-CM | POA: Diagnosis present

## 2023-02-21 DIAGNOSIS — D649 Anemia, unspecified: Secondary | ICD-10-CM | POA: Diagnosis present

## 2023-02-21 DIAGNOSIS — M109 Gout, unspecified: Secondary | ICD-10-CM | POA: Diagnosis present

## 2023-02-21 DIAGNOSIS — Z7401 Bed confinement status: Secondary | ICD-10-CM | POA: Diagnosis not present

## 2023-02-21 DIAGNOSIS — E119 Type 2 diabetes mellitus without complications: Secondary | ICD-10-CM | POA: Diagnosis present

## 2023-02-21 DIAGNOSIS — R059 Cough, unspecified: Secondary | ICD-10-CM | POA: Diagnosis present

## 2023-02-21 DIAGNOSIS — M1990 Unspecified osteoarthritis, unspecified site: Secondary | ICD-10-CM | POA: Diagnosis present

## 2023-02-21 DIAGNOSIS — Z7409 Other reduced mobility: Secondary | ICD-10-CM | POA: Diagnosis present

## 2023-02-21 DIAGNOSIS — B9689 Other specified bacterial agents as the cause of diseases classified elsewhere: Secondary | ICD-10-CM | POA: Diagnosis present

## 2023-02-21 DIAGNOSIS — K2970 Gastritis, unspecified, without bleeding: Secondary | ICD-10-CM | POA: Diagnosis present

## 2023-02-21 LAB — COMPREHENSIVE METABOLIC PANEL
ALT: 10 U/L (ref 0–44)
AST: 18 U/L (ref 15–41)
Albumin: 2.6 g/dL — ABNORMAL LOW (ref 3.5–5.0)
Alkaline Phosphatase: 48 U/L (ref 38–126)
Anion gap: 9 (ref 5–15)
BUN: 22 mg/dL (ref 8–23)
CO2: 25 mmol/L (ref 22–32)
Calcium: 8.1 mg/dL — ABNORMAL LOW (ref 8.9–10.3)
Chloride: 103 mmol/L (ref 98–111)
Creatinine, Ser: 1.2 mg/dL — ABNORMAL HIGH (ref 0.44–1.00)
GFR, Estimated: 42 mL/min — ABNORMAL LOW (ref >60)
Glucose, Bld: 102 mg/dL — ABNORMAL HIGH (ref 70–99)
Potassium: 3.9 mmol/L (ref 3.5–5.1)
Sodium: 137 mmol/L (ref 135–145)
Total Bilirubin: 0.8 mg/dL (ref 0.3–1.2)
Total Protein: 5.9 g/dL — ABNORMAL LOW (ref 6.5–8.1)

## 2023-02-21 LAB — CBC
HCT: 27.7 % — ABNORMAL LOW (ref 36.0–46.0)
Hemoglobin: 8.3 g/dL — ABNORMAL LOW (ref 12.0–15.0)
MCH: 25.7 pg — ABNORMAL LOW (ref 26.0–34.0)
MCHC: 30 g/dL (ref 30.0–36.0)
MCV: 85.8 fL (ref 80.0–100.0)
Platelets: 166 10*3/uL (ref 150–400)
RBC: 3.23 MIL/uL — ABNORMAL LOW (ref 3.87–5.11)
RDW: 14 % (ref 11.5–15.5)
WBC: 9.4 10*3/uL (ref 4.0–10.5)
nRBC: 0 % (ref 0.0–0.2)

## 2023-02-21 LAB — HEMOGLOBIN A1C
Hgb A1c MFr Bld: 5.6 % (ref 4.8–5.6)
Mean Plasma Glucose: 114 mg/dL

## 2023-02-21 LAB — GLUCOSE, CAPILLARY
Glucose-Capillary: 111 mg/dL — ABNORMAL HIGH (ref 70–99)
Glucose-Capillary: 146 mg/dL — ABNORMAL HIGH (ref 70–99)
Glucose-Capillary: 174 mg/dL — ABNORMAL HIGH (ref 70–99)

## 2023-02-21 LAB — PHOSPHORUS: Phosphorus: 3.3 mg/dL (ref 2.5–4.6)

## 2023-02-21 LAB — D-DIMER, QUANTITATIVE: D-Dimer, Quant: 1.5 ug{FEU}/mL — ABNORMAL HIGH (ref 0.00–0.50)

## 2023-02-21 LAB — MAGNESIUM: Magnesium: 1.9 mg/dL (ref 1.7–2.4)

## 2023-02-21 MED ORDER — ENOXAPARIN SODIUM 30 MG/0.3ML IJ SOSY
30.0000 mg | PREFILLED_SYRINGE | INTRAMUSCULAR | Status: DC
  Administered 2023-02-21 – 2023-02-24 (×4): 30 mg via SUBCUTANEOUS
  Filled 2023-02-21 (×4): qty 0.3

## 2023-02-21 NOTE — Plan of Care (Signed)

## 2023-02-21 NOTE — Progress Notes (Signed)
Right lower extremity venous duplex has been completed. Preliminary results can be found in CV Proc through chart review.   02/21/23 10:09 AM Olen Cordial RVT

## 2023-02-21 NOTE — TOC Initial Note (Signed)
Transition of Care [facility]) - Initial/Assessment Note   Patient Details  Name: Holly Banks MRN: [id number] Date of Birth: 1929-05-13  [facility]) CM/SW Contact:    Ewing Schlein, LCSW Phone Number: 02/21/2023, 1:48 PM  Clinical Narrative: CSW spoke with patient's daughter, Holly Banks. Per daughter, she thought the patient would be assessed for rehab. No PT consult placed at this time. CSW explained that even if SNF is recommended, due to patient's positive diagnosis of COVID, patient will not be admitted to facility until day 11 after the initial positive test on 02/19/23. CSW notified hospitalist of daughter's concerns for rehab. PT consult placed. TOC awaiting PT recommendations.  Expected Discharge Plan:  (TBD) Barriers to Discharge: Continued Medical Work up  Patient Goals and CMS Choice Patient states their goals for this hospitalization and ongoing recovery are:: Go to rehab if recommended by PT CMS Medicare.gov Compare Post Acute Care list provided to:: Patient Represenative (must comment) Choice offered to / list presented to : Adult Children  Expected Discharge Plan and Services In-house Referral: Clinical Social Work Living arrangements for the past 2 months: Single Family Home  Prior Living Arrangements/Services Living arrangements for the past 2 months: Single Family Home Lives with:: Adult Children Patient language and need for interpreter reviewed:: Yes Do you feel safe going back to the place where you live?: Yes      Need for Family Participation in Patient Care: Yes (Comment) Care giver support system in place?: Yes (comment) Criminal Activity/Legal Involvement Pertinent to Current Situation/Hospitalization: No - Comment as needed  Activities of Daily Living Home Assistive Devices/Equipment: Wheelchair ADL Screening (condition at time of admission) Patient's cognitive ability adequate to safely complete daily activities?: No Is the patient deaf  or have difficulty hearing?: No Does the patient have difficulty seeing, even when wearing glasses/contacts?: No Does the patient have difficulty concentrating, remembering, or making decisions?: Yes Patient able to express need for assistance with ADLs?: Yes Does the patient have difficulty dressing or bathing?: Yes Independently performs ADLs?: No Communication: Independent Does the patient have difficulty walking or climbing stairs?: Yes Weakness of Legs: Both Weakness of Arms/Hands: None  Permission Sought/Granted Permission sought to share information with : Facility Medical sales representative, Other (comment) Permission granted to share information with : Yes, Verbal Permission Granted Permission granted to share info w AGENCY: SNFs or HH agencies  Emotional Assessment Orientation: : Oriented to Self, Oriented to Place, Oriented to Situation Alcohol / Substance Use: Not Applicable Psych Involvement: No (comment)  Admission diagnosis:  Weakness [R53.1] Acute cystitis without hematuria [N30.00] COVID [U07.1] COVID-19 [U07.1] Patient Active Problem List   Diagnosis Date Noted   Edema of right lower extremity 02/20/2023   Chronic anemia 02/20/2023   COVID-19 02/19/2023   FTT (failure to thrive) in adult 05/07/2019   Red eye 01/30/2019   Pain due to onychomycosis of toenails of both feet 12/26/2018   Diabetic neuropathy (HCC) 12/26/2018   Toe pain, bilateral 10/11/2018   Arthritis 07/12/2018   Localized swelling of right foot 05/26/2018   Change of skin color 05/26/2018   Gait disorder 10/05/2017   Decubitus skin ulcer 10/05/2017   Tremor 07/07/2017   Weakness 04/05/2017   Chills (without fever) 08/23/2016   Neck pain 05/24/2016   Ganglion cyst of flexor tendon sheath of finger 08/22/2015   Paresthesia 11/16/2013   Acute blood loss anemia 07/19/2013   Fracture of femur, right, closed (HCC) 07/11/2013   URI (upper respiratory infection) 06/15/2013   Cough  03/15/2013    Vision loss, right eye 01/05/2012   UTI (urinary tract infection) 03/31/2011   Rash 11/05/2010   SHOULDER PAIN 04/17/2009   TOBACCO USE, QUIT 04/17/2009   GASTRITIS 12/25/2008   ABNORMAL EXAM-BILIARY TRACT 10/16/2008   GASTROINTESTINAL XRAY, ABNORMAL 10/16/2008   ANAL PRURITUS 09/17/2008   Edema 09/17/2008   OTHER DYSPHAGIA 09/17/2008   HAND PAIN 09/07/2007   Vitamin B 12 deficiency 07/05/2007   PARESTHESIA 07/05/2007   TINNITUS NOS 03/23/2007   URINARY INCONTINENCE 03/21/2007   Non-insulin dependent type 2 diabetes mellitus (HCC) 01/16/2007   Vitamin D deficiency 01/16/2007   Gout 01/16/2007   HYPERKALEMIA 01/16/2007   OBESITY, MORBID 01/16/2007   Essential hypertension 01/16/2007   GERD 01/16/2007   Osteoarthritis 01/16/2007   SYMPTOM, INCONTINENCE, URINARY NEC 01/16/2007   COLONIC POLYPS, HX OF 01/16/2007   PCP:  Tresa Garter, MD Pharmacy:   [facility] - [place], [place] - [address] [place] [address] Phone: [phone] Fax: [phone]  [id number] [place], [place] - [address] AT [facility] [address] [place] [address] Phone: [phone] Fax: [phone]  Social Determinants of Health (SDOH) Social History: SDOH Screenings   Food Insecurity: No Food Insecurity (02/20/2023)  Housing: Low Risk  (02/20/2023)  Transportation Needs: No Transportation Needs (02/20/2023)  Utilities: Not At Risk (02/20/2023)  Tobacco Use: Medium Risk (02/20/2023)   SDOH Interventions:    Readmission Risk Interventions     No data to display

## 2023-02-21 NOTE — Progress Notes (Signed)
PROGRESS NOTE  Dick Burdge  DOB: [birth year]  PCP: Tresa Garter, MD NWG:[id number]  DOA: 02/19/2023  LOS: 0 days  Hospital Day: 3  Brief narrative: Holly Banks is a 89 y.o. female with PMH significant for DM2, HTN, diverticulosis, GERD, gastritis, gout, bedbound with chronic sacral decubitus ulcer, overactive bladder, chronic incontinence for which she is on diapers. She was diagnosed with UTI a week ago but never picked up her antibiotic from the pharmacy.   9/14, patient presented to ED with complaints of shortness of breath, cough, and fever.  For the past few days, patient reports significant cough productive of white phlegm, sore throat.  Gradually developed shortness of breath.  In the ED, she had a fever of 100.8, not tachycardic tachypneic or hypoxic  COVID PCR positive Chest x-ray not suggestive of infection. Urinalysis with positive nitrite, negative leukocytes Urine culture, blood culture sent Patient was started on IV Rocephin Admitted to [facility]  Subjective: Patient was seen and examined this morning.  Elderly African-American female.  Lying on bed.  Not in distress.  Feels better.  Not on supplemental oxygen Chart reviewed Fever of 101.3 last night.  Blood pressure in 130s. Labs this morning with hemoglobin down to 8.3, creatinine up to 1.2  Assessment and plan: COVID-19 infection Presented with complaint of shortness of breath, productive cough, fever  COVID PCR positive  Chest x-ray without infiltrates  Started on Paxlovid, not any steroids in the absence of hypoxia  Continue Robitussin-DM PRN for cough.   Airborne and contact precautions.  Blood cultures pending. Had an episode of fever last night.  Continue to monitor Recent Labs  Lab 02/19/23 2223 02/20/23 0257 02/21/23 0351  WBC 6.3  --  9.4  LATICACIDVEN 1.4 1.2  --    UTI Overactive bladder Chronic urinary incontinence Urinalysis with positive nitrate, negative leukocytes Urine  culture pending Continue IV Rocephin  Unilateral right lower extremity edema Per patient this is chronic problem.  She is not endorsing any pain.   D-dimer elevated  Ultrasound duplex lower extremities did not show any evidence of DVT.   Chronic anemia Hemoglobin at baseline close to 9.  No active bleeding.  Continue to monitor Recent Labs    02/19/23 2223 02/21/23 0351  HGB 9.5* 8.3*  MCV 79.9* 85.8   Non-insulin-dependent type 2 diabetes Last A1c 6.7 in November 2020, will repeat.  Metformin 500 mg daily at home.  Currently on hold Continue sensitive sliding scale insulin ACHS.   Hypertension Systolic currently in the 160s.  Was on losartan 50 mg daily at home.  Currently on hold.    Mobility: PT eval pending  Goals of care   Code Status: Full Code     DVT prophylaxis:  enoxaparin (LOVENOX) injection 30 mg Start: 02/21/23 1000   Antimicrobials: Paxlovid, Rocephin Fluid: None Consultants: None Family Communication: None at bedside  Status: Inpatient Level of care:  Med-Surg   Patient is from: Home Needs to continue in-hospital care: Continues to spike fever, pending PT eval Anticipated d/c to: Pending clinical course and PT eval      Diet:  Diet Order             Diet heart healthy/carb modified Room service appropriate? Yes; Fluid consistency: Thin  Diet effective now                   Scheduled Meds:  enoxaparin (LOVENOX) injection  30 mg Subcutaneous Q24H   insulin aspart  0-5 Units Subcutaneous  QHS   insulin aspart  0-9 Units Subcutaneous TID WC   nirmatrelvir/ritonavir (renal dosing)  2 tablet Oral BID    PRN meds: acetaminophen **OR** acetaminophen, guaiFENesin-dextromethorphan, hydrALAZINE, menthol-cetylpyridinium   Infusions:   cefTRIAXone (ROCEPHIN)  IV 1 g (02/20/23 2202)    Antimicrobials: Anti-infectives (From admission, onward)    Start     Dose/Rate Route Frequency Ordered Stop   02/20/23 2200  cefTRIAXone (ROCEPHIN) 1  g in sodium chloride 0.9 % 100 mL IVPB        1 g 200 mL/hr over 30 Minutes Intravenous Every 24 hours 02/20/23 0518     02/20/23 1000  nirmatrelvir/ritonavir (renal dosing) (PAXLOVID) 2 tablet        2 tablet Oral 2 times daily 02/20/23 0520 02/25/23 0959   02/19/23 2315  cefTRIAXone (ROCEPHIN) 1 g in sodium chloride 0.9 % 100 mL IVPB        1 g 200 mL/hr over 30 Minutes Intravenous  Once 02/19/23 2303 02/19/23 2359       Objective: Vitals:   02/21/23 0855 02/21/23 1122  BP: 138/63 (!) 113/57  Pulse: 66 66  Resp:  16  Temp: 98.8 F (37.1 C) 98.7 F (37.1 C)  SpO2: 100% 97%    Intake/Output Summary (Last 24 hours) at 02/21/2023 1531 Last data filed at 02/21/2023 1356 Gross per 24 hour  Intake 665 ml  Output 900 ml  Net -235 ml   Filed Weights   02/20/23 0250  Weight: 72.8 kg   Weight change:  Body mass index is 27.74 kg/m.   Physical Exam: General exam: Elderly African-American female.  Pleasant Skin: No rashes, lesions or ulcers. HEENT: Atraumatic, normocephalic, no obvious bleeding Lungs: Clear to auscultation bilaterally CVS: Regular rate and rhythm, no murmur GI/Abd soft, nontender, nondistended, bowel sound present CNS: Alert, awake, oriented to place and person Psychiatry: Mood appropriate Extremities: No pedal edema, no calf tenderness  Data Review: I have personally reviewed the laboratory data and studies available.  F/u labs ordered Unresulted Labs (From admission, onward)     Start     Ordered   02/22/23 0500  Basic metabolic panel  Tomorrow morning,   R        02/21/23 1531   02/22/23 0500  CBC with Differential/Platelet  Tomorrow morning,   R        02/21/23 1531   02/19/23 2329  Urine Culture  Add-on,   URGENT       Question:  Indication  Answer:  Urgency/frequency   02/19/23 2328            Total time spent in review of labs and imaging, patient evaluation, formulation of plan, documentation and communication with family: 55  minutes  Signed, Lorin Glass, MD [facility] 02/21/2023

## 2023-02-22 DIAGNOSIS — U071 COVID-19: Secondary | ICD-10-CM | POA: Diagnosis not present

## 2023-02-22 LAB — URINE CULTURE: Culture: >=100000 — AB

## 2023-02-22 LAB — CBC WITH DIFFERENTIAL/PLATELET
Abs Immature Granulocytes: 0.03 10*3/uL (ref 0.00–0.07)
Basophils Absolute: 0 10*3/uL (ref 0.0–0.1)
Basophils Relative: 0 %
Eosinophils Absolute: 0.2 10*3/uL (ref 0.0–0.5)
Eosinophils Relative: 2 %
HCT: 24.4 % — ABNORMAL LOW (ref 36.0–46.0)
Hemoglobin: 7.9 g/dL — ABNORMAL LOW (ref 12.0–15.0)
Immature Granulocytes: 0 %
Lymphocytes Relative: 30 %
Lymphs Abs: 2.2 10*3/uL (ref 0.7–4.0)
MCH: 25.1 pg — ABNORMAL LOW (ref 26.0–34.0)
MCHC: 32.4 g/dL (ref 30.0–36.0)
MCV: 77.5 fL — ABNORMAL LOW (ref 80.0–100.0)
Monocytes Absolute: 0.6 10*3/uL (ref 0.1–1.0)
Monocytes Relative: 9 %
Neutro Abs: 4.4 10*3/uL (ref 1.7–7.7)
Neutrophils Relative %: 59 %
Platelets: 174 10*3/uL (ref 150–400)
RBC: 3.15 MIL/uL — ABNORMAL LOW (ref 3.87–5.11)
RDW: 13.6 % (ref 11.5–15.5)
WBC: 7.4 10*3/uL (ref 4.0–10.5)
nRBC: 0 % (ref 0.0–0.2)

## 2023-02-22 LAB — GLUCOSE, CAPILLARY
Glucose-Capillary: 145 mg/dL — ABNORMAL HIGH (ref 70–99)
Glucose-Capillary: 97 mg/dL (ref 70–99)
Glucose-Capillary: 98 mg/dL (ref 70–99)
Glucose-Capillary: 122 mg/dL — ABNORMAL HIGH (ref 70–99)
Glucose-Capillary: 106 mg/dL — ABNORMAL HIGH (ref 70–99)

## 2023-02-22 LAB — BASIC METABOLIC PANEL
Anion gap: 8 (ref 5–15)
BUN: 26 mg/dL — ABNORMAL HIGH (ref 8–23)
CO2: 27 mmol/L (ref 22–32)
Calcium: 7.9 mg/dL — ABNORMAL LOW (ref 8.9–10.3)
Chloride: 102 mmol/L (ref 98–111)
Creatinine, Ser: 1.13 mg/dL — ABNORMAL HIGH (ref 0.44–1.00)
GFR, Estimated: 46 mL/min — ABNORMAL LOW (ref >60)
Glucose, Bld: 94 mg/dL (ref 70–99)
Potassium: 3.4 mmol/L — ABNORMAL LOW (ref 3.5–5.1)
Sodium: 137 mmol/L (ref 135–145)

## 2023-02-22 MED ORDER — SULFAMETHOXAZOLE-TRIMETHOPRIM 400-80 MG PO TABS
1.0000 | ORAL_TABLET | Freq: Two times a day (BID) | ORAL | Status: DC
  Administered 2023-02-22 – 2023-02-24 (×4): 1 via ORAL
  Filled 2023-02-22 (×5): qty 1

## 2023-02-22 MED ORDER — POTASSIUM CHLORIDE CRYS ER 20 MEQ PO TBCR
40.0000 meq | EXTENDED_RELEASE_TABLET | Freq: Once | ORAL | Status: AC
  Administered 2023-02-22: 40 meq via ORAL
  Filled 2023-02-22: qty 2

## 2023-02-22 NOTE — Progress Notes (Signed)
PROGRESS NOTE  Holly Banks  DOB: [birth year]  PCP: Tresa Garter, MD EXB:[id number]  DOA: 02/19/2023  LOS: 1 day  Hospital Day: 4  Brief narrative: Holly Banks is a 89 y.o. female with PMH significant for DM2, HTN, diverticulosis, GERD, gastritis, gout, bedbound with chronic sacral decubitus ulcer, overactive bladder, chronic incontinence for which she is on diapers. She was diagnosed with UTI a week ago but never picked up her antibiotic from the pharmacy.   9/14, patient presented to ED with complaints of shortness of breath, cough, and fever.  For the past few days, patient reports significant cough productive of white phlegm, sore throat.  Gradually developed shortness of breath.  In the ED, she had a fever of 100.8, not tachycardic tachypneic or hypoxic  COVID PCR positive Chest x-ray not suggestive of infection. Urinalysis with positive nitrite, negative leukocytes Urine culture, blood culture sent Patient was started on IV Rocephin Admitted to [facility]  Subjective: Patient was seen and examined this morning.   Elderly African-American female.   Lying on bed.  Not in distress.   No fever last 24 hours.  Assessment and plan: COVID-19 infection Presented with complaint of shortness of breath, productive cough, fever  COVID PCR positive  Chest x-ray without infiltrates  Started on Paxlovid, not any steroids in the absence of hypoxia  Continue Robitussin-DM PRN for cough.   Airborne and contact precautions.  Blood cultures pending. Had an episode of fever last night.  Continue to monitor Recent Labs  Lab 02/19/23 2223 02/20/23 0257 02/21/23 0351 02/22/23 0307  WBC 6.3  --  9.4 7.4  LATICACIDVEN 1.4 1.2  --   --    Citrobacter UTI Overactive bladder Chronic urinary incontinence Urinalysis with positive nitrate, negative leukocytes Urine culture is growing more than 100,000 CFU per mL of Citrobacter Freundii.  No growth in blood culture. Continue IV  Rocephin  Unilateral right lower extremity edema Per patient this is chronic problem.  She is not endorsing any pain.   D-dimer elevated  Ultrasound duplex lower extremities did not show any evidence of DVT.   Chronic anemia Hemoglobin at baseline close to 9.  Hemoglobin gradually dropping without active bleeding.  Likely dilutional.  Continue to monitor Recent Labs    02/19/23 2223 02/21/23 0351 02/22/23 0307  HGB 9.5* 8.3* 7.9*  MCV 79.9* 85.8 77.5*   Non-insulin-dependent type 2 diabetes A1c 5.6 on 02/20/2023 PTA meds- metformin 500 mg daily  Currently on sensitive sliding scale insulin ACHS. Recent Labs  Lab 02/21/23 1120 02/21/23 1604 02/21/23 2029 02/22/23 0814 02/22/23 1202  GLUCAP 146* 174* 145* 97 98    Hypertension Systolic currently in the 160s.  Was on losartan 50 mg daily at home.  Currently on hold.  Hypokalemia Potassium level low at 3.4 today.  Replacement given. Recent Labs  Lab 02/19/23 2223 02/21/23 0351 02/22/23 0307  K 4.0 3.9 3.4*  MG  --  1.9  --   PHOS  --  3.3  --      Mobility: PT eval pending  Goals of care   Code Status: Full Code     DVT prophylaxis:  enoxaparin (LOVENOX) injection 30 mg Start: 02/21/23 1000   Antimicrobials: Paxlovid, Rocephin Fluid: None Consultants: None Family Communication: None at bedside  Status: Inpatient Level of care:  Med-Surg   Patient is from: Home Needs to continue in-hospital care: Improving legal status.  Pending PT eval Anticipated d/c to: Pending PT eval for disposition plan  Diet:  Diet Order             Diet heart healthy/carb modified Room service appropriate? Yes; Fluid consistency: Thin  Diet effective now                   Scheduled Meds:  enoxaparin (LOVENOX) injection  30 mg Subcutaneous Q24H   insulin aspart  0-5 Units Subcutaneous QHS   insulin aspart  0-9 Units Subcutaneous TID WC   nirmatrelvir/ritonavir (renal dosing)  2 tablet Oral BID    PRN  meds: acetaminophen **OR** acetaminophen, guaiFENesin-dextromethorphan, hydrALAZINE, menthol-cetylpyridinium   Infusions:   cefTRIAXone (ROCEPHIN)  IV 1 g (02/21/23 2238)    Antimicrobials: Anti-infectives (From admission, onward)    Start     Dose/Rate Route Frequency Ordered Stop   02/20/23 2200  cefTRIAXone (ROCEPHIN) 1 g in sodium chloride 0.9 % 100 mL IVPB        1 g 200 mL/hr over 30 Minutes Intravenous Every 24 hours 02/20/23 0518     02/20/23 1000  nirmatrelvir/ritonavir (renal dosing) (PAXLOVID) 2 tablet        2 tablet Oral 2 times daily 02/20/23 0520 02/25/23 0959   02/19/23 2315  cefTRIAXone (ROCEPHIN) 1 g in sodium chloride 0.9 % 100 mL IVPB        1 g 200 mL/hr over 30 Minutes Intravenous  Once 02/19/23 2303 02/19/23 2359       Objective: Vitals:   02/22/23 0057 02/22/23 0708  BP: (!) 155/61 (!) 152/67  Pulse: 69 60  Resp: 15 14  Temp: 98.6 F (37 C) 98.5 F (36.9 C)  SpO2: 99% 100%    Intake/Output Summary (Last 24 hours) at 02/22/2023 1408 Last data filed at 02/22/2023 0000 Gross per 24 hour  Intake 460 ml  Output 200 ml  Net 260 ml   Filed Weights   02/20/23 0250  Weight: 72.8 kg   Weight change:  Body mass index is 27.74 kg/m.   Physical Exam: General exam: Elderly African-American female.  Pleasant.  Not in pain. Skin: No rashes, lesions or ulcers. HEENT: Atraumatic, normocephalic, no obvious bleeding Lungs: Clear to auscultation bilaterally CVS: Regular rate and rhythm, no murmur GI/Abd soft, nontender, nondistended, bowel sound present CNS: Alert, awake, oriented to place and person Psychiatry: Mood appropriate Extremities: No pedal edema, no calf tenderness  Data Review: I have personally reviewed the laboratory data and studies available.  F/u labs ordered Unresulted Labs (From admission, onward)    None       Total time spent in review of labs and imaging, patient evaluation, formulation of plan, documentation and  communication with family: 45 minutes  Signed, Lorin Glass, MD [facility] 02/22/2023

## 2023-02-22 NOTE — Plan of Care (Signed)
  Problem: Education: Goal: Knowledge of General Education information will improve Description Including pain rating scale, medication(s)/side effects and non-pharmacologic comfort measures Outcome: Progressing   Problem: Health Behavior/Discharge Planning: Goal: Ability to manage health-related needs will improve Outcome: Progressing   

## 2023-02-22 NOTE — Evaluation (Addendum)
Physical Therapy Evaluation Patient Details Name: Holly Banks MRN: [id number] DOB: [birth year] Today's Date: 02/22/2023  History of Present Illness  89 yo female admitted with COVID, UTI. Hx of OA, falls, DM, gout, chronic sacral decubitus, R distal femur fx s/p ORIF 2015, obesity, knee replacement, hip replacement  Clinical Impression  On eval, pt required Max A for mobility. Assisted pt into recliner-mostly dependent transfer. Pt reports, at baseline, either her son or "nurse" assists her with transfers, bed<>WC/BSC. Pt reports this has been her baseline "for some time." Pt tolerated activity fairly well. No family present during session. Recommend maximize home health services (HHPT, HHOT, home health aide). Will plan to follow pt during this hospital stay.       If plan is discharge home, recommend the following: Assist for transportation;Assistance with cooking/housework;Help with stairs or ramp for entrance;A lot of help with walking and/or transfers;A lot of help with bathing/dressing/bathroom   Can travel by private vehicle        Equipment Recommendations None recommended by PT  Recommendations for Other Services  OT consult    Functional Status Assessment       Precautions / Restrictions Precautions Precautions: Fall Restrictions Weight Bearing Restrictions: No      Mobility  Bed Mobility Overal bed mobility: Needs Assistance Bed Mobility: Supine to Sit     Supine to sit: Max assist, HOB elevated     General bed mobility comments: Assist for bil LEs and trunk. Utilized bedpad to for scooting, positioning at EOB. Once EOB, pt able to sit unsupported without LOB.     Transfers Overall transfer level: Needs assistance   Transfers: Bed to chair/wheelchair/BSC   Stand pivot (modified) transfers: Max assist         General transfer comment: Assist to power up into partial standing. Pt able to bear weight on LEs. She could not take any steps. Bed>recliner  transfers with therapist holding pt around trunk. Pt unable to scoot in chair-total assist for repositioning.    Ambulation/Gait               General Gait Details: nonambulatory at baseline  Stairs            Wheelchair Mobility     Tilt Bed    Modified Rankin (Stroke Patients Only)       Balance Overall balance assessment: History of Falls, Needs assistance Sitting-balance support: Feet supported Sitting balance-[name]: Good       Standing balance-[name]: Poor                               Pertinent Vitals/Pain Pain Assessment Pain Assessment: Faces Faces Pain Scale: Hurts little more Pain Location: buttocks, LEs Pain Descriptors / Indicators: Grimacing, Aching Pain Intervention(s): Limited activity within patient's tolerance, Monitored during session, Repositioned    Home Living Family/patient expects to be discharged to:: Unsure Living Arrangements: Children Available Help at Discharge: Family Type of Home: House         Home Layout: One level Home Equipment: Wheelchair - manual;Hospital bed      Prior Function Prior Level of Function : Needs assist             Mobility Comments: nonambulatory. transfers only-requires assistance (nurse or son assists with transfers bed<>WC/BSC ADLs Comments: requires assistance     Extremity/Trunk Assessment   Upper Extremity Assessment Upper Extremity Assessment:  (limited shoulder ROM bilaterally)    Lower  Extremity Assessment Lower Extremity Assessment: Generalized weakness    Cervical / Trunk Assessment Cervical / Trunk Assessment: Kyphotic  Communication   Communication Communication: No apparent difficulties  Cognition Arousal: Alert Behavior During Therapy: WFL for tasks assessed/performed Overall Cognitive Status: Within Functional Limits for tasks assessed                                          General Comments      Exercises      Assessment/Plan    PT Assessment Patient needs continued PT services  PT Problem List Decreased strength;Decreased range of motion;Decreased activity tolerance;Decreased balance;Decreased mobility;Pain;Decreased skin integrity       PT Treatment Interventions DME instruction;Functional mobility training;Therapeutic activities;Therapeutic exercise;Patient/family education;Balance training    PT Goals (Current goals can be found in the Care Plan section)  Acute Rehab PT Goals Patient Stated Goal: none stated PT Goal Formulation: With patient Time For Goal Achievement: 03/08/23 Potential to Achieve Goals: Poor    Frequency Min 1X/week     Co-evaluation               AM-PAC PT "6 Clicks" Mobility  Outcome Measure Help needed turning from your back to your side while in a flat bed without using bedrails?: A Lot Help needed moving from lying on your back to sitting on the side of a flat bed without using bedrails?: A Lot Help needed moving to and from a bed to a chair (including a wheelchair)?: A Lot Help needed standing up from a chair using your arms (e.g., wheelchair or bedside chair)?: A Lot Help needed to walk in hospital room?: Total Help needed climbing 3-5 steps with a railing? : Total 6 Click Score: 10    End of Session Equipment Utilized During Treatment: Gait belt Activity Tolerance: Patient tolerated treatment well Patient left: in chair;with call bell/phone within reach Nurse Communication: Made NT aware of how pt mobilizes/was transferred to recliner  PT Visit Diagnosis: Muscle weakness (generalized) (M62.81);History of falling (Z91.81);Other abnormalities of gait and mobility (R26.89)    Time: 1610-9604 PT Time Calculation (min) (ACUTE ONLY): 25 min   Charges:   PT Evaluation $PT Eval Low Complexity: 1 Low   PT General Charges $$ ACUTE PT VISIT: 1 Visit            Faye Ramsay, PT Acute Rehabilitation  Office: [phone]

## 2023-02-23 DIAGNOSIS — U071 COVID-19: Secondary | ICD-10-CM | POA: Diagnosis not present

## 2023-02-23 LAB — GLUCOSE, CAPILLARY
Glucose-Capillary: 83 mg/dL (ref 70–99)
Glucose-Capillary: 142 mg/dL — ABNORMAL HIGH (ref 70–99)
Glucose-Capillary: 129 mg/dL — ABNORMAL HIGH (ref 70–99)
Glucose-Capillary: 127 mg/dL — ABNORMAL HIGH (ref 70–99)
Glucose-Capillary: 85 mg/dL (ref 70–99)

## 2023-02-23 MED ORDER — CEFADROXIL 500 MG PO CAPS: 1000.0000 mg | ORAL_CAPSULE | Freq: Two times a day (BID) | ORAL | Status: DC

## 2023-02-23 NOTE — Progress Notes (Signed)
PROGRESS NOTE  Holly Banks  DOB: 04/13/1929  PCP: Tresa Garter, MD BJY:[id number]  DOA: 02/19/2023  LOS: 2 days  Hospital Day: 5  Brief narrative: Holly Banks is a 89 y.o. female with PMH significant for DM2, HTN, diverticulosis, GERD, gastritis, gout, bedbound with chronic sacral decubitus ulcer, overactive bladder, chronic incontinence for which she is on diapers. She was diagnosed with UTI a week ago but never picked up her antibiotic from the pharmacy.   9/14, patient presented to ED with complaints of shortness of breath, cough, and fever.  For the past few days, patient reports significant cough productive of white phlegm, sore throat.  Gradually developed shortness of breath.  In the ED, she had a fever of 100.8, not tachycardic tachypneic or hypoxic  COVID PCR positive Chest x-ray not suggestive of infection. Urinalysis with positive nitrite, negative leukocytes Urine culture, blood culture sent Patient was started on IV Rocephin Admitted to [facility]  Subjective: Patient was seen and examined this morning.   Elderly African-American female.   Lying on bed.  Not in distress.   I called and discussed with her daughter who states she is sick from COVID herself and is looking around for other family members to take her.  Assessment and plan: COVID-19 infection Presented with complaint of shortness of breath, productive cough, fever  COVID PCR positive.  Chest x-ray without infiltrates  Started on 5-day course of Paxlovid.  Not any steroids in the absence of hypoxia  Continue Robitussin-DM PRN for cough.   Airborne and contact precautions.  Blood cultures did not show any growth. Recent Labs  Lab 02/19/23 2223 02/20/23 0257 02/21/23 0351 02/22/23 0307  WBC 6.3  --  9.4 7.4  LATICACIDVEN 1.4 1.2  --   --    Citrobacter UTI Overactive bladder Chronic urinary incontinence Urinalysis with positive nitrate, negative leukocytes Urine culture is growing more  than 100,000 CFU per mL of Citrobacter Freundii.  No growth in blood culture. Initially started on IV Rocephin.  Currently on Bactrim.  Unilateral right lower extremity edema Per patient this is chronic problem.  She is not endorsing any pain.   D-dimer elevated  Ultrasound duplex lower extremities did not show any evidence of DVT.   Chronic anemia Hemoglobin at baseline close to 9.  Hemoglobin gradually dropping without active bleeding.  Likely dilutional. Continue to monitor Recent Labs    02/19/23 2223 02/21/23 0351 02/22/23 0307  HGB 9.5* 8.3* 7.9*  MCV 79.9* 85.8 77.5*   Non-insulin-dependent type 2 diabetes A1c 5.6 on 02/20/2023 PTA meds- metformin 500 mg daily  Currently on sensitive sliding scale insulin ACHS. Recent Labs  Lab 02/22/23 0814 02/22/23 1202 02/22/23 1700 02/22/23 2218 02/23/23 0750  GLUCAP 97 98 122* 106* 83    Hypertension Systolic currently in the 160s.  Was on losartan 50 mg daily at home.  Currently on hold.  Hypokalemia Potassium level was low and replaced. Recent Labs  Lab 02/19/23 2223 02/21/23 0351 02/22/23 0307  K 4.0 3.9 3.4*  MG  --  1.9  --   PHOS  --  3.3  --      Mobility: PT eval obtained.  Home health PT recommended  Goals of care   Code Status: Full Code     DVT prophylaxis:  enoxaparin (LOVENOX) injection 30 mg Start: 02/21/23 1000   Antimicrobials: Paxlovid, Rocephin Fluid: None Consultants: None Family Communication: None at bedside  Status: Inpatient Level of care:  Med-Surg   Patient is from:  Home Anticipated d/c to: Home with PT recommended by physical therapy.  Daughter states that she herself is sick with COVID and is unable to take mom home.  She is looking around for other family members to help out.     Diet:  Diet Order             Diet heart healthy/carb modified Room service appropriate? Yes; Fluid consistency: Thin  Diet effective now                   Scheduled Meds:  enoxaparin  (LOVENOX) injection  30 mg Subcutaneous Q24H   insulin aspart  0-5 Units Subcutaneous QHS   insulin aspart  0-9 Units Subcutaneous TID WC   nirmatrelvir/ritonavir (renal dosing)  2 tablet Oral BID   sulfamethoxazole-trimethoprim  1 tablet Oral Q12H    PRN meds: acetaminophen **OR** acetaminophen, guaiFENesin-dextromethorphan, hydrALAZINE, menthol-cetylpyridinium   Infusions:     Antimicrobials: Anti-infectives (From admission, onward)    Start     Dose/Rate Route Frequency Ordered Stop   02/22/23 2200  sulfamethoxazole-trimethoprim (BACTRIM) 400-80 MG per tablet 1 tablet        1 tablet Oral Every 12 hours 02/22/23 1422 02/25/23 0959   02/20/23 2200  cefTRIAXone (ROCEPHIN) 1 g in sodium chloride 0.9 % 100 mL IVPB  Status:  Discontinued        1 g 200 mL/hr over 30 Minutes Intravenous Every 24 hours 02/20/23 0518 02/22/23 1422   02/20/23 1000  nirmatrelvir/ritonavir (renal dosing) (PAXLOVID) 2 tablet        2 tablet Oral 2 times daily 02/20/23 0520 02/25/23 0959   02/19/23 2315  cefTRIAXone (ROCEPHIN) 1 g in sodium chloride 0.9 % 100 mL IVPB        1 g 200 mL/hr over 30 Minutes Intravenous  Once 02/19/23 2303 02/19/23 2359       Objective: Vitals:   02/23/23 0915 02/23/23 0915  BP: (!) 172/74 (!) 172/74  Pulse: 69 69  Resp:    Temp:    SpO2:  100%    Intake/Output Summary (Last 24 hours) at 02/23/2023 1134 Last data filed at 02/23/2023 0759 Gross per 24 hour  Intake 470 ml  Output 1575 ml  Net -1105 ml   Filed Weights   02/20/23 0250  Weight: 72.8 kg   Weight change:  Body mass index is 27.74 kg/m.   Physical Exam: General exam: Elderly African-American female.  Pleasant.  Not in pain. Skin: No rashes, lesions or ulcers. HEENT: Atraumatic, normocephalic, no obvious bleeding Lungs: Clear to auscultation bilaterally CVS: Regular rate and rhythm, no murmur GI/Abd soft, nontender, nondistended, bowel sound present CNS: Alert, awake, oriented to place and  person Psychiatry: Mood appropriate Extremities: No pedal edema, no calf tenderness  Data Review: I have personally reviewed the laboratory data and studies available.  F/u labs ordered Unresulted Labs (From admission, onward)    None       Total time spent in review of labs and imaging, patient evaluation, formulation of plan, documentation and communication with family: 45 minutes  Signed, Lorin Glass, MD [facility] 02/23/2023

## 2023-02-23 NOTE — Plan of Care (Signed)

## 2023-02-23 NOTE — TOC Progression Note (Signed)
Transition of Care [facility]) - Progression Note   Patient Details  Name: Holly Banks MRN: [id number] Date of Birth: 1928-12-01  Transition of Care [facility] CM/SW Contact  Ewing Schlein, LCSW Phone Number: 02/23/2023, 2:58 PM  Clinical Narrative: PT evaluation recommended HH (PT/OT/aide) as patient is nonambulatory at baseline. CSW called the daughter to set up [facility] for when the patient is medically ready to return home (per hospitalist, he already informed daughter it will be tomorrow). Daughter repeatedly refused to accept the patient back home stating, "well, you'll have to figure that out. I can't receive her." Daughter stated she cannot care for the patient. CSW explained that the patient cannot remain in the hospital once medically ready for discharge. Daughter stated, "are you going to discharge her to the streets?" CSW explained multiple times that the patient will be discharged home. Daughter refused to cooperate with [facility] referral and continued to state she "can't receive" the patient. TOC supervisor and hospitalist updated regarding daughter.  CSW spoke with patient with NT's assistance regarding setting up [facility] services. CSW also explained to patient she is expected to discharge tomorrow. CSW made [facility] referral to Cindie with [facility], which was accepted. HH orders are in. TOC to follow.   Expected Discharge Plan: Home w Home Health Services Barriers to Discharge: Continued Medical Work up  Expected Discharge Plan and Services In-house Referral: Clinical Social Work Living arrangements for the past 2 months: Single Family Home HH Arranged: PT, OT, Nurse's Aide HH Agency: [facility] Date [facility] Agency Contacted: 02/23/23 Time HH Agency Contacted: 1444 Representative spoke with at [facility] Agency: Cindie  Social Determinants of Health (SDOH) Interventions SDOH Screenings   Food Insecurity: No Food Insecurity (02/20/2023)  Housing: Low Risk  (02/20/2023)  Transportation Needs: No  Transportation Needs (02/20/2023)  Utilities: Not At Risk (02/20/2023)  Tobacco Use: Medium Risk (02/20/2023)   Readmission Risk Interventions     No data to display

## 2023-02-23 NOTE — Plan of Care (Signed)
Problem: Coping: Goal: Level of anxiety will decrease Outcome: Progressing   Problem: Safety: Goal: Ability to remain free from injury will improve Outcome: Progressing   Problem: Skin Integrity: Goal: Risk for impaired skin integrity will decrease Outcome: Progressing

## 2023-02-24 DIAGNOSIS — U071 COVID-19: Secondary | ICD-10-CM | POA: Diagnosis not present

## 2023-02-24 LAB — GLUCOSE, CAPILLARY
Glucose-Capillary: 89 mg/dL (ref 70–99)
Glucose-Capillary: 125 mg/dL — ABNORMAL HIGH (ref 70–99)
Glucose-Capillary: 79 mg/dL (ref 70–99)

## 2023-02-24 MED ORDER — GUAIFENESIN-DM 100-10 MG/5ML PO SYRP: 5.0000 mL | ORAL_SOLUTION | ORAL | 0 refills | Status: DC | PRN

## 2023-02-24 MED ORDER — NIRMATRELVIR/RITONAVIR (PAXLOVID) TABLET (RENAL DOSING): 2.0000 | ORAL_TABLET | Freq: Two times a day (BID) | ORAL | Status: AC

## 2023-02-24 NOTE — Discharge Summary (Signed)
Physician Discharge Summary  Holly Banks ZOX:[id number] DOB: 1928-12-21 DOA: 02/19/2023  PCP: Tresa Garter, MD  Admit date: 02/19/2023 Discharge date: 02/24/2023  Admitted From: Home Discharge disposition: Home with home health  Recommendations at discharge:  To complete remaining course of Paxlovid at home.    Brief narrative: Holly Banks is a 89 y.o. Banks with PMH significant for DM2, HTN, diverticulosis, GERD, gastritis, gout, bedbound with chronic sacral decubitus ulcer, overactive bladder, chronic incontinence for which she is on diapers. She was diagnosed with UTI a week ago but never picked up her antibiotic from the pharmacy.   9/14, patient presented to ED with complaints of shortness of breath, cough, and fever.  For the past few days, patient reports significant cough productive of white phlegm, sore throat.  Gradually developed shortness of breath.  In the ED, she had a fever of 100.8, not tachycardic tachypneic or hypoxic  COVID PCR positive Chest x-ray not suggestive of infection. Urinalysis with positive nitrite, negative leukocytes Urine culture, blood culture sent Patient was started on IV Rocephin Admitted to [facility]  Subjective: Patient was seen and examined this morning.   Holly Banks.   Lying on bed.  Not in distress.   Feels ready to go home.  Hospital course: COVID-19 infection Presented with complaint of shortness of breath, productive cough, fever  COVID PCR positive.  Chest x-ray without infiltrates  Started on 5-day course of Paxlovid.  To complete the course tomorrow 9/20. Not any steroids in the absence of hypoxia  Continue Robitussin-DM PRN for cough. Airborne and contact precautions.  Blood cultures did not show any growth. Recent Labs  Lab 02/19/23 2223 02/20/23 0257 02/21/23 0351 02/22/23 0307  WBC 6.3  --  9.4 7.4  LATICACIDVEN 1.4 1.2  --   --    Citrobacter UTI Overactive bladder Chronic  urinary incontinence Urinalysis with positive nitrate, negative leukocytes Urine culture is growing more than 100,000 CFU per mL of Citrobacter Freundii.  No growth in blood culture. Initially started on IV Rocephin.  Currently on Bactrim.  Completed 5-day course of antibiotics today.  No antibiotics needed at discharge.  Unilateral right lower extremity edema Per patient this is chronic problem.  She is not endorsing any pain.   D-dimer elevated  Ultrasound duplex lower extremities did not show any evidence of DVT.   Chronic anemia Hemoglobin at baseline close to 9.  Hemoglobin lower than baseline but likely dilutional.  No active bleeding. Recent Labs    02/19/23 2223 02/21/23 0351 02/22/23 0307  HGB 9.5* 8.3* 7.9*  MCV 79.9* 85.8 77.5*   Non-insulin-dependent type 2 diabetes A1c 5.6 on 02/20/2023 PTA meds- metformin 500 mg daily  Resume the same post discharge. Recent Labs  Lab 02/23/23 1345 02/23/23 1718 02/23/23 2219 02/24/23 0826 02/24/23 1149  GLUCAP 129* 127* 85 89 125*    Hypertension Continue losartan as before.  Impaired mobility  PT eval obtained.  Home health PT recommended  Goals of care   Code Status: Full Code   Wounds:  - Pressure Injury 02/19/23 Coccyx Mid;Posterior Stage 2 -  Partial thickness loss of dermis presenting as a shallow open injury with a red, pink wound bed without slough. pink, shiny, with scabbing, small area with skin loss (Active)  Date First Assessed/Time First Assessed: 02/19/23 0300   Location: Coccyx  Location Orientation: Mid;Posterior  Staging: Stage 2 -  Partial thickness loss of dermis presenting as a shallow open injury with a red, pink wound bed  without slough.  Wound ...    Assessments 02/21/2023  8:00 AM 02/24/2023  9:00 AM  Dressing Type Foam - Lift dressing to assess site every shift Foam - Lift dressing to assess site every shift  Dressing -- Clean, Dry, Intact  Dressing Change Frequency PRN PRN  State of Healing Fully  granulated Fully granulated  Site / Wound Assessment Dry;Clean;Pink Clean;Dry;Pink  Peri-wound Assessment Intact --  Wound Length (cm) 2 cm --  Wound Width (cm) 2 cm --  Wound Surface Area (cm^2) 4 cm^2 --  Margins Attached edges (approximated) --  Drainage Amount None None  Drainage Description No odor --  Treatment Cleansed;Off loading Other (Comment)     No associated orders.    Discharge Exam:   Vitals:   02/23/23 1348 02/23/23 1441 02/23/23 2221 02/24/23 0700  BP: (!) 148/51 (!) 117/57 (!) 160/63 139/65  Pulse: 67 69 68 68  Resp: 18 17 15 17   Temp: (!) 97.5 F (36.4 C) 98.6 F (37 C) 100 F (37.8 C) 99.5 F (37.5 C)  TempSrc: Oral Oral Oral Oral  SpO2: 100% 100% 100% 100%  Weight:      Height:        Body mass index is 27.74 kg/m.   General exam: Holly Banks.  Pleasant.  Not in pain. Skin: No rashes, lesions or ulcers. HEENT: Atraumatic, normocephalic, no obvious bleeding Lungs: Clear to auscultation bilaterally CVS: Regular rate and rhythm, no murmur GI/Abd soft, nontender, nondistended, bowel sound present CNS: Alert, awake, oriented to place and person Psychiatry: Mood appropriate Extremities: No pedal edema, no calf tenderness  Follow ups:    Follow-up Information     Plotnikov, Georgina Quint, MD Follow up.   Specialty: Internal Medicine Contact information: [address] [place] [address] [phone]                 Discharge Instructions:   Discharge Instructions     Call MD for:  difficulty breathing, headache or visual disturbances   Complete by: As directed    Call MD for:  extreme fatigue   Complete by: As directed    Call MD for:  hives   Complete by: As directed    Call MD for:  persistant dizziness or light-headedness   Complete by: As directed    Call MD for:  persistant nausea and vomiting   Complete by: As directed    Call MD for:  severe uncontrolled pain   Complete by: As directed    Call  MD for:  temperature >100.4   Complete by: As directed    Diet general   Complete by: As directed    Discharge instructions   Complete by: As directed    Recommendations at discharge:   To complete remaining course of Paxlovid at home.  General discharge instructions: Follow with Primary MD Plotnikov, Georgina Quint, MD in 7 days  Please request your PCP  to go over your hospital tests, procedures, radiology results at the follow up. Please get your medicines reviewed and adjusted.  Your PCP may decide to repeat certain labs or tests as needed. Do not drive, operate heavy machinery, perform activities at heights, swimming or participation in water activities or provide baby sitting services if your were admitted for syncope or siezures until you have seen by Primary MD or a Neurologist and advised to do so again. [place] Controlled Substance Reporting System database was reviewed. Do not drive, operate heavy machinery, perform activities  at heights, swim, participate in water activities or provide baby-sitting services while on medications for pain, sleep and mood until your outpatient physician has reevaluated you and advised to do so again.  You are strongly recommended to comply with the dose, frequency and duration of prescribed medications. Activity: As tolerated with Full fall precautions use walker/cane & assistance as needed Avoid using any recreational substances like cigarette, tobacco, alcohol, or non-prescribed drug. If you experience worsening of your admission symptoms, develop shortness of breath, life threatening emergency, suicidal or homicidal thoughts you must seek medical attention immediately by calling 911 or calling your MD immediately  if symptoms less severe. You must read complete instructions/literature along with all the possible adverse reactions/side effects for all the medicines you take and that have been prescribed to you. Take any new medicine only after you have  completely understood and accepted all the possible adverse reactions/side effects.  Wear Seat belts while driving. You were cared for by a hospitalist during your hospital stay. If you have any questions about your discharge medications or the care you received while you were in the hospital after you are discharged, you can call the unit and ask to speak with the hospitalist or the covering physician. Once you are discharged, your primary care physician will handle any further medical issues. Please note that NO REFILLS for any discharge medications will be authorized once you are discharged, as it is imperative that you return to your primary care physician (or establish a relationship with a primary care physician if you do not have one).   Discharge wound care:   Complete by: As directed    Increase activity slowly   Complete by: As directed        Discharge Medications:   Allergies as of 02/24/2023       Reactions   Ditropan [oxybutynin] Other (See Comments)   Dry mouth   Penicillins Rash        Medication List     STOP taking these medications    cefpodoxime 100 MG tablet Commonly known as: VANTIN       TAKE these medications    aspirin 81 MG tablet Take 81 mg by mouth daily.   CORTIZONE-10 EX Apply 1 application  topically as needed (itching).   guaiFENesin-dextromethorphan 100-10 MG/5ML syrup Commonly known as: ROBITUSSIN DM Take 5 mLs by mouth every 4 (four) hours as needed for cough.   losartan 50 MG tablet Commonly known as: COZAAR TAKE 1 TABLET EVERY DAY  FOR  HYPERTENSION   metFORMIN 500 MG tablet Commonly known as: GLUCOPHAGE Take 1 tablet (500 mg total) by mouth 2 (two) times daily with a meal. Must keep scheduled appt for future refills What changed:  when to take this additional instructions   nirmatrelvir/ritonavir (renal dosing) 10 x 150 MG & 10 x 100MG  Tabs Commonly known as: PAXLOVID Take 2 tablets by mouth 2 (two) times daily for 5  days. Patient GFR is >60. Take nirmatrelvir (150 mg) one tablet twice daily for 5 days and ritonavir (100 mg) one tablet twice daily for 5 days.   OVER THE COUNTER MEDICATION Take 1 lozenge by mouth as needed (cough). Ricola Honey drops with cough Supressant syrup in the center   [facility] EX Apply 1 patch topically daily.   Vitamin D 50 MCG (2000 UT) Caps Take 2,000 Units by mouth daily.   Voltaren 1 % Gel Generic drug: diclofenac Sodium Apply 1 Application topically as needed (pain).  Discharge Care Instructions  (From admission, onward)           Start     Ordered   02/24/23 0000  Discharge wound care:        02/24/23 1307             The results of significant diagnostics from this hospitalization (including imaging, microbiology, ancillary and laboratory) are listed below for reference.    Procedures and Diagnostic Studies:   DG Chest Port 1 View  Result Date: 02/19/2023 CLINICAL DATA:  Fever and cough EXAM: PORTABLE CHEST 1 VIEW COMPARISON:  Radiograph 11/12/2013 FINDINGS: Stable cardiomediastinal silhouette. Aortic atherosclerotic calcification. Left basilar atelectasis. Trace pleural effusions or pleural thickening. No pneumothorax. No displaced rib fractures. Advanced arthritis both shoulders. IMPRESSION: No active disease. Electronically Signed   By: Minerva Fester M.D.   On: 02/19/2023 22:33     Labs:   Basic Metabolic Panel: Recent Labs  Lab 02/19/23 2223 02/21/23 0351 02/22/23 0307  NA 139 137 137  K 4.0 3.9 3.4*  CL 102 103 102  CO2 29 25 27   GLUCOSE 187* 102* 94  BUN 17 22 26*  CREATININE 0.96 1.20* 1.13*  CALCIUM 9.0 8.1* 7.9*  MG  --  1.9  --   PHOS  --  3.3  --    GFR Estimated Creatinine Clearance: 30.9 mL/min (A) (by C-G formula based on SCr of 1.13 mg/dL (H)). Liver Function Tests: Recent Labs  Lab 02/19/23 2223 02/21/23 0351  AST 15 18  ALT 7 10  ALKPHOS 55 48  BILITOT 0.3 0.8  PROT 7.2 5.9*  ALBUMIN  3.5 2.6*   No results for input(s): "LIPASE", "AMYLASE" in the last 168 hours. No results for input(s): "AMMONIA" in the last 168 hours. Coagulation profile No results for input(s): "INR", "PROTIME" in the last 168 hours.  CBC: Recent Labs  Lab 02/19/23 2223 02/21/23 0351 02/22/23 0307  WBC 6.3 9.4 7.4  NEUTROABS 3.8  --  4.4  HGB 9.5* 8.3* 7.9*  HCT 29.9* 27.7* 24.4*  MCV 79.9* 85.8 77.5*  PLT 205 166 174   Cardiac Enzymes: No results for input(s): "CKTOTAL", "CKMB", "CKMBINDEX", "TROPONINI" in the last 168 hours. BNP: Invalid input(s): "POCBNP" CBG: Recent Labs  Lab 02/23/23 1345 02/23/23 1718 02/23/23 2219 02/24/23 0826 02/24/23 1149  GLUCAP 129* 127* 85 89 125*   D-Dimer No results for input(s): "DDIMER" in the last 72 hours. Hgb A1c No results for input(s): "HGBA1C" in the last 72 hours. Lipid Profile No results for input(s): "CHOL", "HDL", "LDLCALC", "TRIG", "CHOLHDL", "LDLDIRECT" in the last 72 hours. Thyroid function studies No results for input(s): "TSH", "T4TOTAL", "T3FREE", "THYROIDAB" in the last 72 hours.  Invalid input(s): "FREET3" Anemia work up No results for input(s): "VITAMINB12", "FOLATE", "FERRITIN", "TIBC", "IRON", "RETICCTPCT" in the last 72 hours. Microbiology Recent Results (from the past 240 hour(s))  Blood culture (routine x 2)     Status: None (Preliminary result)   Collection Time: 02/19/23 10:23 PM   Specimen: BLOOD RIGHT HAND  Result Value Ref Range Status   Specimen Description   Final    BLOOD RIGHT HAND Performed at [facility], [address], [place], [place] [address]    Special Requests   Final    BOTTLES DRAWN AEROBIC AND ANAEROBIC Blood Culture adequate volume Performed at [facility], [address] [place], [place] [address]    Culture   Final    NO GROWTH 4 DAYS Performed at [facility], [address], [place], [place] [address]    Report Status PENDING  Incomplete  Blood  culture (routine x 2)     Status: None (Preliminary result)   Collection Time: 02/19/23 10:23 PM   Specimen: BLOOD  Result Value Ref Range Status   Specimen Description   Final    BLOOD LEFT ANTECUBITAL Performed at [facility], [address] [place], [place] [address]    Special Requests   Final    BOTTLES DRAWN AEROBIC AND ANAEROBIC Blood Culture results may not be optimal due to an excessive volume of blood received in culture bottles Performed at [facility], [address], [place], [place] [address]    Culture   Final    NO GROWTH 4 DAYS Performed at [facility], [address] [place], [place] [address]    Report Status PENDING  Incomplete  Resp panel by RT-PCR (RSV, Flu A&B, Covid)     Status: Abnormal   Collection Time: 02/19/23 10:23 PM   Specimen: Nasal Swab  Result Value Ref Range Status   SARS Coronavirus 2 by RT PCR POSITIVE (A) NEGATIVE Final    Comment: (NOTE) SARS-CoV-2 target nucleic acids are DETECTED.  The SARS-CoV-2 RNA is generally detectable in upper respiratory specimens during the acute phase of infection. Positive results are indicative of the presence of the identified virus, but do not rule out bacterial infection or co-infection with other pathogens not detected by the test. Clinical correlation with patient history and other diagnostic information is necessary to determine patient infection status. The expected result is Negative.  Fact Sheet for Patients: BloggerCourse.com  Fact Sheet for Healthcare Providers: SeriousBroker.it  This test is not yet approved or cleared by the [place] FDA and  has been authorized for detection and/or diagnosis of SARS-CoV-2 by FDA under an Emergency Use Authorization (EUA).  This EUA will remain in effect (meaning this test can be used) for the duration of  the COVID-19 declaration under Section 564(b)(1)  of the A ct, 21 U.S.C. section 360bbb-3(b)(1), unless the authorization is terminated or revoked sooner.     Influenza A by PCR NEGATIVE NEGATIVE Final   Influenza B by PCR NEGATIVE NEGATIVE Final    Comment: (NOTE) The Xpert Xpress SARS-CoV-2/FLU/RSV plus assay is intended as an aid in the diagnosis of influenza from Nasopharyngeal swab specimens and should not be used as a sole basis for treatment. Nasal washings and aspirates are unacceptable for Xpert Xpress SARS-CoV-2/FLU/RSV testing.  Fact Sheet for Patients: BloggerCourse.com  Fact Sheet for Healthcare Providers: SeriousBroker.it  This test is not yet approved or cleared by the [place] FDA and has been authorized for detection and/or diagnosis of SARS-CoV-2 by FDA under an Emergency Use Authorization (EUA). This EUA will remain in effect (meaning this test can be used) for the duration of the COVID-19 declaration under Section 564(b)(1) of the Act, 21 U.S.C. section 360bbb-3(b)(1), unless the authorization is terminated or revoked.     Resp Syncytial Virus by PCR NEGATIVE NEGATIVE Final    Comment: (NOTE) Fact Sheet for Patients: BloggerCourse.com  Fact Sheet for Healthcare Providers: SeriousBroker.it  This test is not yet approved or cleared by the [place] FDA and has been authorized for detection and/or diagnosis of SARS-CoV-2 by FDA under an Emergency Use Authorization (EUA). This EUA will remain in effect (meaning this test can be used) for the duration of the COVID-19 declaration under Section 564(b)(1) of the Act, 21  U.S.C. section 360bbb-3(b)(1), unless the authorization is terminated or revoked.  Performed at [facility], [address] [place], [place] [address]   Urine Culture     Status: Abnormal   Collection Time: 02/19/23 10:23 PM   Specimen: Urine, Clean Catch   Result Value Ref Range Status   Specimen Description   Final    URINE, CLEAN CATCH Performed at [facility], [address], [place], [place] [address]    Special Requests   Final    NONE Performed at [facility], [address] [place], [place] [address]    Culture >=100,000 COLONIES/mL CITROBACTER FREUNDII (A)  Final   Report Status 02/22/2023 FINAL  Final   Organism ID, Bacteria CITROBACTER FREUNDII (A)  Final      Susceptibility   Citrobacter freundii - MIC*    CEFEPIME <=0.12 SENSITIVE Sensitive     CEFTRIAXONE <=0.25 SENSITIVE Sensitive     CIPROFLOXACIN 0.5 INTERMEDIATE Intermediate     GENTAMICIN <=1 SENSITIVE Sensitive     IMIPENEM 1 SENSITIVE Sensitive     NITROFURANTOIN <=16 SENSITIVE Sensitive     TRIMETH/SULFA <=20 SENSITIVE Sensitive     PIP/TAZO <=4 SENSITIVE Sensitive     * >=100,000 COLONIES/mL CITROBACTER FREUNDII    Time coordinating discharge: 45 minutes  Signed: Aujanae Mccullum  [facility] 02/24/2023, 1:07 PM

## 2023-02-24 NOTE — Plan of Care (Signed)
?  Problem: Coping: ?Goal: Level of anxiety will decrease ?Outcome: Progressing ?  ?Problem: Safety: ?Goal: Ability to remain free from injury will improve ?Outcome: Progressing ?  ?

## 2023-02-24 NOTE — Plan of Care (Signed)
Problem: Education: Goal: Knowledge of General Education information will improve Description Including pain rating scale, medication(s)/side effects and non-pharmacologic comfort measures Outcome: Progressing   Problem: Clinical Measurements: Goal: Ability to maintain clinical measurements within normal limits will improve Outcome: Progressing   Problem: Activity: Goal: Risk for activity intolerance will decrease Outcome: Progressing

## 2023-02-24 NOTE — TOC Transition Note (Signed)
Transition of Care [facility]) - CM/SW Discharge Note  Patient Details  Name: Holly Banks MRN: [id number] Date of Birth: February 12, 1929  [facility]) CM/SW Contact:  Ewing Schlein, LCSW Phone Number: 02/24/2023, 1:17 PM  Clinical Narrative: CSW spoke with patient's daughter, Ludger Nutting, regarding the patient needing house keys to return home. Per daughter, she wanted the patient to discharge to her granddaughter's home at [address], [place], [place] [address] but this is outside the 50 mile transport radius for PTAR. Patient will now discharge to her daughter's home at [address], [place], [place] [address]. CSW notified [place] with [facility] regarding change is discharge address. Per Cindie, if patient discharges to [place], [facility] can start [facility] if the patient will be there for at least 7 visits. If not, HH will need to start when patient returns to the area. [facility] to coordinate with daughter. Medical necessity form done; PTAR scheduled. Discharge packet completed. RN updated. TOC signing off.  Final next level of care: Home w Home Health Services Barriers to Discharge: Barriers Resolved  Patient Goals and CMS Choice CMS Medicare.gov Compare Post Acute Care list provided to:: Patient Represenative (must comment) Choice offered to / list presented to : Adult Children  Discharge Placement Patient to be transferred to facility by: PTAR Name of family member notified: Ludger Nutting (daughter) Patient and family notified of of transfer: 02/24/23  Discharge Plan and Services Additional resources added to the After Visit Summary for   In-house Referral: Clinical Social Work      DME Arranged: N/A DME Agency: NA HH Arranged: PT, OT, Nurse's Aide HH Agency: [facility] Date [facility] Agency Contacted: 02/23/23 Time HH Agency Contacted: [phone] Representative spoke with at [facility] Agency: Cindie  Social Determinants of Health (SDOH) Interventions SDOH Screenings    Food Insecurity: No Food Insecurity (02/20/2023)  Housing: Low Risk  (02/20/2023)  Transportation Needs: No Transportation Needs (02/20/2023)  Utilities: Not At Risk (02/20/2023)  Tobacco Use: Medium Risk (02/20/2023)   Readmission Risk Interventions     No data to display

## 2023-02-24 NOTE — Progress Notes (Signed)
Physical Therapy Treatment Patient Details Name: Holly Banks MRN: [id number] DOB: September 21, 1928 Today's Date: 02/24/2023   History of Present Illness 89 yo female admitted with COVID, UTI. Hx of OA, falls, DM, gout, chronic sacral decubitus, R distal femur fx s/p ORIF 2015, obesity, knee replacement, hip replacement    PT Comments  Patient agreeable to mobility. Mod support to sitting, 2 person assist to stand x 2 at bed edge. Incontinent of BM. Patient returned to  sidelying as patient reporting buttocks are  painful(? Pressure soreness.)  Continue PT for mobility.   If plan is discharge home, recommend the following: Assist for transportation;Assistance with cooking/housework;Help with stairs or ramp for entrance;A lot of help with walking and/or transfers;A lot of help with bathing/dressing/bathroom   Can travel by private vehicle        Equipment Recommendations  None recommended by PT    Recommendations for Other Services       Precautions / Restrictions Precautions Precautions: Fall Restrictions Weight Bearing Restrictions: No     Mobility  Bed Mobility   Bed Mobility: Rolling, Supine to Sit, Sit to Supine Rolling: Max assist, Used rails   Supine to sit: Max assist, HOB elevated     General bed mobility comments: Assist for bil LEs and trunk. Utilized bedpad to for scooting, positioning at EOB. Once EOB, pt able to sit unsupported without LOB.    Transfers Overall transfer level: Needs assistance Equipment used: 2 person hand held assist               General transfer comment: max support to stnad with HHA, noted incontinent of BM. Stood again with bear hug technique to be cleaned up. Asisted back into bed and placed on right side as patient reports buttocks are burning.    Ambulation/Gait                   Stairs             Wheelchair Mobility     Tilt Bed    Modified Rankin (Stroke Patients Only)       Balance Overall  balance assessment: History of Falls, Needs assistance   Sitting balance-[name]: Good     Standing balance support: Bilateral upper extremity supported, During functional activity Standing balance-[name]: Poor                              Cognition Arousal: Alert Behavior During Therapy: WFL for tasks assessed/performed Overall Cognitive Status: Within Functional Limits for tasks assessed                                          Exercises      General Comments        Pertinent Vitals/Pain Pain Assessment Faces Pain Scale: Hurts even more Pain Location: buttocks, LEs Pain Descriptors / Indicators: Grimacing, Aching Pain Intervention(s): Repositioned    Home Living                          Prior Function            PT Goals (current goals can now be found in the care plan section) Progress towards PT goals: Progressing toward goals    Frequency    Min 1X/week  PT Plan      Co-evaluation              AM-PAC PT "6 Clicks" Mobility   Outcome Measure  Help needed turning from your back to your side while in a flat bed without using bedrails?: A Lot Help needed moving from lying on your back to sitting on the side of a flat bed without using bedrails?: A Lot Help needed moving to and from a bed to a chair (including a wheelchair)?: A Lot Help needed standing up from a chair using your arms (e.g., wheelchair or bedside chair)?: Total Help needed to walk in hospital room?: Total Help needed climbing 3-5 steps with a railing? : Total 6 Click Score: 9    End of Session Equipment Utilized During Treatment: Gait belt Activity Tolerance: Patient tolerated treatment well Patient left: in bed;with call bell/phone within reach Nurse Communication: Mobility status PT Visit Diagnosis: Muscle weakness (generalized) (M62.81);History of falling (Z91.81);Other abnormalities of gait and mobility (R26.89)      Time: 1121-1209 PT Time Calculation (min) (ACUTE ONLY): 48 min  Charges:    $Therapeutic Activity: 23-37 mins $Self Care/Home Management: 8-22 PT General Charges $$ ACUTE PT VISIT: 1 Visit                     Blanchard Kelch PT Acute Rehabilitation Services Office [phone] Weekend pager-[phone]    Rada Hay 02/24/2023, 1:27 PM

## 2023-02-24 NOTE — Progress Notes (Signed)
After discussion with family, ptar staff, and ptar supervisor Holly Banks), patient is being transported to address given ([address]) when fire will be there to assist patient up 4 steps on a stretcher into the home, patient will be safely transferred to bed and afterwards family plans to get her into their car and transport her elsewhere themselves, patient's iv was removed priot to transport, vss, apple watch placed on her left wrist, room checked for belongings but none found, daughter Holly Banks) given an eta for arrival.

## 2023-02-25 LAB — CULTURE, BLOOD (ROUTINE X 2)
Culture: NO GROWTH
Culture: NO GROWTH
Special Requests: ADEQUATE

## 2023-02-28 ENCOUNTER — Telehealth: Payer: Self-pay | Admitting: Internal Medicine

## 2023-02-28 NOTE — Telephone Encounter (Signed)
HH ORDERS   Caller Name: [facility] Agency Name: Randolm Idol Phone #: [phone](secure)  Service Requested: Nursing for evaluation and treatment of pressure sore (examples: OT/PT/Skilled Nursing/Social Work/Speech Therapy/Wound Care)

## 2023-03-01 NOTE — Telephone Encounter (Signed)
Okay. Thank you.

## 2023-03-01 NOTE — Telephone Encounter (Signed)
Spoke with Eileen Stanford and was able to give her the Verbal Ok for Nursing for evaluation and treatment of pressure sore

## 2023-03-07 NOTE — Telephone Encounter (Signed)
Spoke with Holly Banks and she wanted clarifications on the orders. She understood and will call back if she had any questions ot concerns.

## 2023-03-07 NOTE — Telephone Encounter (Signed)
Tonya from [facility] called Byrd Hesselbach back about the orders. She would like a call back at [phone].

## 2023-03-09 NOTE — Telephone Encounter (Signed)
Holly Banks from [facility] states she did her evaluation for occupational therapy and skilled nursing. Jon Gills states the pt already has a full time care giver and is at her max capacity right now.   Please call Jon Gills with any questions: [phone]

## 2023-03-14 NOTE — Telephone Encounter (Signed)
Ok Thx 

## 2023-03-16 ENCOUNTER — Emergency Department (HOSPITAL_COMMUNITY): Payer: Medicare PPO

## 2023-03-16 ENCOUNTER — Other Ambulatory Visit: Payer: Self-pay

## 2023-03-16 ENCOUNTER — Emergency Department (HOSPITAL_COMMUNITY)
Admission: EM | Admit: 2023-03-16 | Discharge: 2023-03-16 | Disposition: A | Discharge status: Home / Self Care | Payer: Medicare PPO | Attending: Student | Admitting: Student

## 2023-03-16 DIAGNOSIS — W06XXXA Fall from bed, initial encounter: Secondary | ICD-10-CM | POA: Insufficient documentation

## 2023-03-16 DIAGNOSIS — S0993XA Unspecified injury of face, initial encounter: Secondary | ICD-10-CM | POA: Diagnosis present

## 2023-03-16 DIAGNOSIS — E119 Type 2 diabetes mellitus without complications: Secondary | ICD-10-CM | POA: Insufficient documentation

## 2023-03-16 DIAGNOSIS — Z7984 Long term (current) use of oral hypoglycemic drugs: Secondary | ICD-10-CM | POA: Insufficient documentation

## 2023-03-16 DIAGNOSIS — Z79899 Other long term (current) drug therapy: Secondary | ICD-10-CM | POA: Insufficient documentation

## 2023-03-16 DIAGNOSIS — S0181XA Laceration without foreign body of other part of head, initial encounter: Secondary | ICD-10-CM | POA: Insufficient documentation

## 2023-03-16 DIAGNOSIS — Z7982 Long term (current) use of aspirin: Secondary | ICD-10-CM | POA: Insufficient documentation

## 2023-03-16 DIAGNOSIS — Z87891 Personal history of nicotine dependence: Secondary | ICD-10-CM | POA: Diagnosis not present

## 2023-03-16 DIAGNOSIS — Z8616 Personal history of COVID-19: Secondary | ICD-10-CM | POA: Insufficient documentation

## 2023-03-16 DIAGNOSIS — W19XXXA Unspecified fall, initial encounter: Secondary | ICD-10-CM

## 2023-03-16 DIAGNOSIS — I10 Essential (primary) hypertension: Secondary | ICD-10-CM | POA: Insufficient documentation

## 2023-03-16 DIAGNOSIS — S01111A Laceration without foreign body of right eyelid and periocular area, initial encounter: Secondary | ICD-10-CM | POA: Insufficient documentation

## 2023-03-16 DIAGNOSIS — Z96643 Presence of artificial hip joint, bilateral: Secondary | ICD-10-CM | POA: Diagnosis not present

## 2023-03-16 LAB — CBC WITH DIFFERENTIAL/PLATELET
Abs Immature Granulocytes: 0.03 10*3/uL (ref 0.00–0.07)
Basophils Absolute: 0 10*3/uL (ref 0.0–0.1)
Basophils Relative: 0 %
Eosinophils Absolute: 0.2 10*3/uL (ref 0.0–0.5)
Eosinophils Relative: 3 %
HCT: 25.4 % — ABNORMAL LOW (ref 36.0–46.0)
Hemoglobin: 8 g/dL — ABNORMAL LOW (ref 12.0–15.0)
Immature Granulocytes: 0 %
Lymphocytes Relative: 25 %
Lymphs Abs: 2 10*3/uL (ref 0.7–4.0)
MCH: 24.7 pg — ABNORMAL LOW (ref 26.0–34.0)
MCHC: 31.5 g/dL (ref 30.0–36.0)
MCV: 78.4 fL — ABNORMAL LOW (ref 80.0–100.0)
Monocytes Absolute: 0.7 10*3/uL (ref 0.1–1.0)
Monocytes Relative: 8 %
Neutro Abs: 5.2 10*3/uL (ref 1.7–7.7)
Neutrophils Relative %: 64 %
Platelets: 254 10*3/uL (ref 150–400)
RBC: 3.24 MIL/uL — ABNORMAL LOW (ref 3.87–5.11)
RDW: 15.2 % (ref 11.5–15.5)
WBC: 8.2 10*3/uL (ref 4.0–10.5)
nRBC: 0 % (ref 0.0–0.2)

## 2023-03-16 LAB — COMPREHENSIVE METABOLIC PANEL
ALT: 9 U/L (ref 0–44)
AST: 20 U/L (ref 15–41)
Albumin: 2.7 g/dL — ABNORMAL LOW (ref 3.5–5.0)
Alkaline Phosphatase: 55 U/L (ref 38–126)
Anion gap: 11 (ref 5–15)
BUN: 22 mg/dL (ref 8–23)
CO2: 23 mmol/L (ref 22–32)
Calcium: 9 mg/dL (ref 8.9–10.3)
Chloride: 108 mmol/L (ref 98–111)
Creatinine, Ser: 1.02 mg/dL — ABNORMAL HIGH (ref 0.44–1.00)
GFR, Estimated: 52 mL/min — ABNORMAL LOW (ref >60)
Glucose, Bld: 124 mg/dL — ABNORMAL HIGH (ref 70–99)
Potassium: 4.5 mmol/L (ref 3.5–5.1)
Sodium: 142 mmol/L (ref 135–145)
Total Bilirubin: 0.6 mg/dL (ref 0.3–1.2)
Total Protein: 6.3 g/dL — ABNORMAL LOW (ref 6.5–8.1)

## 2023-03-16 LAB — CK: Total CK: 87 U/L (ref 38–234)

## 2023-03-16 MED ORDER — ACETAMINOPHEN 500 MG PO TABS
1000.0000 mg | ORAL_TABLET | Freq: Once | ORAL | Status: AC
  Administered 2023-03-16: 1000 mg via ORAL
  Filled 2023-03-16: qty 2

## 2023-03-16 NOTE — ED Provider Notes (Signed)
[facility] EM[facility] DE[facility]MENT AT [facility] Provider Note  CSN: [id number] Arrival date & time: 03/16/23 0710  Chief Complaint(s) Fall  HPI Holly Banks is a 89 y.o. female with PMH T2DM, HTN, obesity who presents as a level 2 trauma for a fall with confusion.  Per EMS.  It patient fell last night and was found down.  Unknown downtime.  Arrives with a head wound over the right temple with dried blood.  EMS states the patient was confused in the field but is answering all questions appropriately here in the emergency department.  Currently denies chest pain, shortness of breath, abdominal pain, nausea, vomiting or other systemic symptoms.  Denies numbness, tingling, weakness or other neurologic or traumatic complaints.   Past Medical History Past Medical History:  Diagnosis Date   Diabetes mellitus    type II   Diverticulosis    Gastritis    w/ hx of h. pylori   GERD (gastroesophageal reflux disease)    Gout    Hyperkalemia    Hypertension    Leiomyoma 2-02   colonic polypoid    Morbid obesity (HCC)    Osteoarthritis    Pain in joint, lower leg    Unspecified vitamin D deficiency    Urinary incontinence    Patient Active Problem List   Diagnosis Date Noted   Edema of right lower extremity 02/20/2023   Chronic anemia 02/20/2023   COVID-19 02/19/2023   FTT (failure to thrive) in adult 05/07/2019   Red eye 01/30/2019   Pain due to onychomycosis of toenails of both feet 12/26/2018   Diabetic neuropathy (HCC) 12/26/2018   Pain in toes of both feet 10/11/2018   Arthritis 07/12/2018   Localized swelling of right foot 05/26/2018   Change of skin color 05/26/2018   Gait disorder 10/05/2017   Decubitus skin ulcer 10/05/2017   Tremor 07/07/2017   Weakness 04/05/2017   Chills (without fever) 08/23/2016   Neck pain 05/24/2016   Ganglion cyst of flexor tendon sheath of finger 08/22/2015   Paresthesia 11/16/2013   Acute blood loss anemia 07/19/2013   Fracture of  femur, right, closed (HCC) 07/11/2013   URI (upper respiratory infection) 06/15/2013   Cough 03/15/2013   Vision loss, right eye 01/05/2012   UTI (urinary tract infection) 03/31/2011   Rash 11/05/2010   SHOULDER PAIN 04/17/2009   TOBACCO USE, QUIT 04/17/2009   Gastritis and gastroduodenitis 12/25/2008   ABNORMAL EXAM-BILIARY TRACT 10/16/2008   GASTROINTESTINAL XRAY, ABNORMAL 10/16/2008   ANAL PRURITUS 09/17/2008   Edema 09/17/2008   OTHER DYSPHAGIA 09/17/2008   HAND PAIN 09/07/2007   Vitamin B 12 deficiency 07/05/2007   PARESTHESIA 07/05/2007   Tinnitus 03/23/2007   URINARY INCONTINENCE 03/21/2007   Non-insulin dependent type 2 diabetes mellitus (HCC) 01/16/2007   Vitamin D deficiency 01/16/2007   Gout 01/16/2007   HYPERKALEMIA 01/16/2007   OBESITY, MORBID 01/16/2007   Essential hypertension 01/16/2007   GERD 01/16/2007   Osteoarthritis 01/16/2007   SYMPTOM, INCONTINENCE, URINARY NEC 01/16/2007   History of colonic polyps 01/16/2007   Home Medication(s) Prior to Admission medications   Medication Sig Start Date End Date Taking? Authorizing Provider  aspirin EC 81 MG tablet Take 81 mg by mouth daily. Swallow whole.   Yes [provider]  Cholecalciferol (VITAMIN D) 50 MCG (2000 UT) CAPS Take 2,000 Units by mouth daily.   Yes [provider]  losartan (COZAAR) 50 MG tablet TAKE 1 TABLET EVERY DAY  FOR  HYPERTENSION 07/12/18  Yes  Plotnikov, Georgina Quint, MD  metFORMIN (GLUCOPHAGE) 500 MG tablet Take 1 tablet (500 mg total) by mouth 2 (two) times daily with a meal. Must keep scheduled appt for future refills Patient taking differently: Take 500 mg by mouth daily. 07/12/18  Yes Plotnikov, Georgina Quint, MD                                                                                                                                    Past Surgical History Past Surgical History:  Procedure Laterality Date   CHOLECYSTECTOMY     LITHOTRIPSY     ORIF FEMUR FRACTURE N/A  07/12/2013   Procedure: OPEN REDUCTION INTERNAL FIXATION (ORIF) DISTAL FEMUR FRACTURE;  Surgeon: Velna Ochs, MD;  Location: [facility] OR;  Service: Orthopedics;  Laterality: N/A;   right tkr     thr left     thr left 3/4     TOTAL HIP ARTHROPLASTY  2009   right   TOTAL HIP ARTHROPLASTY     left   Family History Family History  Problem Relation Age of Onset   Cancer Sister        breast, stomach   Diabetes Sister    Diabetes Other    Colon cancer Neg Hx    Stomach cancer Neg Hx    Esophageal cancer Neg Hx     Social History Social History   Tobacco Use   Smoking status: Former    Current packs/day: 0.00    Types: Cigarettes    Quit date: 06/07/1960    Years since quitting: 62.8   Smokeless tobacco: Never  Substance Use Topics   Alcohol use: No   Drug use: No   Allergies Ditropan [oxybutynin] and Penicillins  Review of Systems Review of Systems  All other systems reviewed and are negative.   Physical Exam Vital Signs  I have reviewed the triage vital signs BP (!) 146/72   Pulse 77   Temp 98 F (36.7 C)   Resp 18   SpO2 98%   Physical Exam Vitals and nursing note reviewed.  Constitutional:      General: She is not in acute distress.    Appearance: She is well-developed.  HENT:     Head: Normocephalic.     Comments: 1 cm right peri orbital laceration Eyes:     Conjunctiva/sclera: Conjunctivae normal.  Cardiovascular:     Rate and Rhythm: Normal rate and regular rhythm.     Heart sounds: No murmur heard. Pulmonary:     Effort: Pulmonary effort is normal. No respiratory distress.     Breath sounds: Normal breath sounds.  Abdominal:     Palpations: Abdomen is soft.     Tenderness: There is no abdominal tenderness.  Musculoskeletal:        General: No swelling.     Cervical back: Neck supple.  Skin:    General: Skin is warm and dry.  Capillary Refill: Capillary refill takes less than 2 seconds.  Neurological:     Mental Status: She is alert.   Psychiatric:        Mood and Affect: Mood normal.     ED Results and Treatments Labs (all labs ordered are listed, but only abnormal results are displayed) Labs Reviewed  COMPREHENSIVE METABOLIC PANEL - Abnormal; Notable for the following components:      Result Value   Glucose, Bld 124 (*)    Creatinine, Ser 1.02 (*)    Total Protein 6.3 (*)    Albumin 2.7 (*)    GFR, Estimated 52 (*)    All other components within normal limits  CBC WITH DIFFERENTIAL/PLATELET - Abnormal; Notable for the following components:   RBC 3.24 (*)    Hemoglobin 8.0 (*)    HCT 25.4 (*)    MCV 78.4 (*)    MCH 24.7 (*)    All other components within normal limits  CK  CBC WITH DIFFERENTIAL/PLATELET                                                                                                                          Radiology CT CERVICAL SPINE WO CONTRAST  Result Date: 03/16/2023 CLINICAL DATA:  89 year old female status post fall from bed. EXAM: CT CERVICAL SPINE WITHOUT CONTRAST TECHNIQUE: Multidetector CT imaging of the cervical spine was performed without intravenous contrast. Multiplanar CT image reconstructions were also generated. RADIATION DOSE REDUCTION: This exam was performed according to the departmental dose-optimization program which includes automated exposure control, adjustment of the mA and/or kV according to patient size and/or use of iterative reconstruction technique. COMPARISON:  Head CT today.  Cervical spine CT 05/26/2009. FINDINGS: Alignment: Mildly improved cervical lordosis compared to 2010. Cervicothoracic junction alignment is within normal limits. Bilateral posterior element alignment is within normal limits. Skull base and vertebrae: Bone mineralization is within normal limits for age. Visualized skull base is intact. No atlanto-occipital dissociation. C1 and C2 appear highly degenerated, but intact and aligned. Widespread degenerative appearing cervical spinal ankylosis,  detailed below. No acute osseous abnormality identified. Soft tissues and spinal canal: No prevertebral fluid or swelling. No visible canal hematoma. Negative visible noncontrast neck soft tissues, calcified carotid bifurcation atherosclerosis. Disc levels: Development of degenerative cervical ankylosis since 2010 at C2-C3, C3-C4, C6-C7, C7-T1. Underlying advanced chronic disc and endplate degeneration, partially calcified ligament flavum hypertrophy throughout. Multilevel degenerative cervical spinal stenosis, up to moderate. Upper chest: Partially visible thoracic spine interbody ankylosis beginning at T3 from flowing endplate osteophytes. Maintained upper thoracic vertebral height. Upper lungs are clear. Calcified aortic atherosclerosis. Partially visible severe left glenohumeral and moderate bilateral sternoclavicular joint degeneration. IMPRESSION: 1. No acute traumatic injury identified in the cervical spine. 2. Advanced cervical spine degeneration superimposed on widespread degenerative cervical ankylosis. Multilevel cervical spinal stenosis, probably moderate. 3. Partially visible thoracic spine ankylosis. Aortic Atherosclerosis (ICD10-I70.0). Electronically Signed   By: Odessa Fleming M.D.   On: 03/16/2023 08:29  DG Pelvis Portable  Result Date: 03/16/2023 CLINICAL DATA:  89 year old female status post fall from bed. EXAM: PORTABLE PELVIS 1-2 VIEWS COMPARISON:  CT Abdomen and Pelvis 02/16/2017. FINDINGS: Portable AP supine view at 0731 hours. Rotated to the right. Chronic bilateral hip arthroplasty. Pelvis osteopenia. Large chronic calcified uterine fibroids. Nonobstructed visible bowel gas pattern. Retained stool in the pelvis. No acute osseous abnormality identified. Calcified iliofemoral atherosclerosis. IMPRESSION: 1. No acute fracture or dislocation identified on AP rotated view of the pelvis. Partially visible chronic bilateral hip arthroplasty. If there is lateralizing hip pain, then recommend  dedicated hip series. 2. Large calcified chronic uterine fibroids. Calcified femoral artery atherosclerosis. Electronically Signed   By: Odessa Fleming M.D.   On: 03/16/2023 08:25   DG Chest Portable 1 View  Result Date: 03/16/2023 CLINICAL DATA:  89 year old female status post fall from bed. EXAM: PORTABLE CHEST 1 VIEW COMPARISON:  Portable chest 02/19/2023 and earlier. FINDINGS: Portable AP semi upright view at 0734 hours. Stable mediastinal contours with mild tortuosity of the thoracic aorta. Lower lung volumes. But Allowing for portable technique the lungs are clear. No pneumothorax or pleural effusion. Advanced chronic appearing changes at both shoulders. Bone mineralization is within normal limits for age. No acute osseous abnormality identified. IMPRESSION: No acute cardiopulmonary abnormality or acute traumatic injury identified. Electronically Signed   By: Odessa Fleming M.D.   On: 03/16/2023 08:24   CT HEAD WO CONTRAST ( )  Result Date: 03/16/2023 CLINICAL DATA:  89 year old female status post fall from bed. EXAM: CT HEAD WITHOUT CONTRAST TECHNIQUE: Contiguous axial images were obtained from the base of the skull through the vertex without intravenous contrast. RADIATION DOSE REDUCTION: This exam was performed according to the departmental dose-optimization program which includes automated exposure control, adjustment of the mA and/or kV according to patient size and/or use of iterative reconstruction technique. COMPARISON:  Head CT 03/17/2011. FINDINGS: Brain: New but chronic appearing lacunar infarct of the left corona radiata and lentiform since 2012. No midline shift, ventriculomegaly, mass effect, evidence of mass lesion, intracranial hemorrhage or evidence of cortically based acute infarction. Gray-white matter differentiation elsewhere is within normal limits. New anterior, mesial temporal lobe atrophy since 2012. Vascular: No suspicious intracranial vascular hyperdensity. Calcified atherosclerosis at  the skull base. Skull: Stable.  No acute osseous abnormality identified. Sinuses/Orbits: Paranasal sinuses remain well aerated. Tympanic cavities and mastoids appear clear. Other: Bilateral forehead, periorbital scalp soft tissue hematoma and contusion greater on the right (series 4 image 32). Intraorbital soft tissues appear to remain intact, chronic postoperative changes to the globes. No scalp soft tissue gas. IMPRESSION: 1. Right greater than left forehead, periorbital scalp soft tissue injury. No skull fracture or acute intracranial abnormality identified. 2. Chronic appearing lacunar infarct of the left corona radiata and lentiform, new since 2012. Electronically Signed   By: Odessa Fleming M.D.   On: 03/16/2023 08:23    Pertinent labs & imaging results that were available during my care of the patient were reviewed by me and considered in my medical decision making (see MDM for details).  Medications Ordered in ED Medications  acetaminophen (TYLENOL) tablet 1,000 mg (1,000 mg Oral Given 03/16/23 1118)  Procedures .Marland KitchenLaceration Repair  Date/Time: 03/16/2023 7:58 PM  Performed by: Glendora Score, MD Authorized by: Glendora Score, MD   Laceration details:    Location:  Face   Face location:  R eyebrow   Length (cm):  1 Skin repair:    Repair method:  Tissue adhesive Repair type:    Repair type:  Simple Post-procedure details:    Dressing:  Open (no dressing)   (including critical care time)  Medical Decision Making / ED Course   This patient presents to the ED for concern of fall, this involves an extensive number of treatment options, and is a complaint that carries with it a high risk of complications and morbidity.  The differential diagnosis includes fracture, contusion, hematoma, ligamentous injury, closed head injury, ICH, laceration, intrathoracic  injury, intra-abdominal injury  MDM: Patient seen emergency room for evaluation of a fall.  Exam with a 1 cm laceration to the lateral aspect of the right orbit but is otherwise unremarkable.  No appreciable tenderness of the chest abdomen or pelvis.  Patient's confusion appears to have resolved on arrival and thus the patient's initial trauma activation was downgraded to a non trauma.  Laboratory evaluation with a hemoglobin of 8.0 with an MCV of 78.4 which is near patient's baseline.  CK is normal.  Hypoalbuminemia to 2.7 but labs otherwise unremarkable.  Trauma evaluation including CT head, C-spine, chest x-ray and pelvis x-ray unremarkable.  Facial laceration repaired with Dermabond.  With overall negative workup patient does not meet inpatient criteria for admission she is safe for discharge with outpatient follow-up.   Additional history obtained: -Additional history obtained from multiple family members -External records from outside source obtained and reviewed including: Chart review including previous notes, labs, imaging, consultation notes   Lab Tests: -I ordered, reviewed, and interpreted labs.   The pertinent results include:   Labs Reviewed  COMPREHENSIVE METABOLIC PANEL - Abnormal; Notable for the following components:      Result Value   Glucose, Bld 124 (*)    Creatinine, Ser 1.02 (*)    Total Protein 6.3 (*)    Albumin 2.7 (*)    GFR, Estimated 52 (*)    All other components within normal limits  CBC WITH DIFFERENTIAL/PLATELET - Abnormal; Notable for the following components:   RBC 3.24 (*)    Hemoglobin 8.0 (*)    HCT 25.4 (*)    MCV 78.4 (*)    MCH 24.7 (*)    All other components within normal limits  CK  CBC WITH DIFFERENTIAL/PLATELET      EKG   EKG Interpretation Date/Time:  Wednesday March 16 2023 07:18:32 EDT Ventricular Rate:  71 PR Interval:  160 QRS Duration:  92 QT Interval:  396 QTC Calculation: 431 R Axis:   34  Text Interpretation: Sinus  rhythm Confirmed by Tanyiah Laurich ([id number]) on 03/16/2023 8:42:40 AM         Imaging Studies ordered: I ordered imaging studies including CT head, C-spine, chest x-ray, pelvis x-ray I independently visualized and interpreted imaging. I agree with the radiologist interpretation   Medicines ordered and prescription drug management: Meds ordered this encounter  Medications   acetaminophen (TYLENOL) tablet 1,000 mg    -I have reviewed the patients home medicines and have made adjustments as needed  Critical interventions none   Cardiac Monitoring: The patient was maintained on a cardiac monitor.  I personally viewed and interpreted the cardiac monitored which showed an underlying rhythm of: NSR  Social Determinants  of Health:  Factors impacting patients care include: none   Reevaluation: After the interventions noted above, I reevaluated the patient and found that they have :improved  Co morbidities that complicate the patient evaluation  Past Medical History:  Diagnosis Date   Diabetes mellitus    type II   Diverticulosis    Gastritis    w/ hx of h. pylori   GERD (gastroesophageal reflux disease)    Gout    Hyperkalemia    Hypertension    Leiomyoma 2-02   colonic polypoid    Morbid obesity (HCC)    Osteoarthritis    Pain in joint, lower leg    Unspecified vitamin D deficiency    Urinary incontinence       Dispostion: I considered admission for this patient, but at this time she does not meet inpatient criteria for admission she is safe for discharge with outpatient follow-up.     Final Clinical Impression(s) / ED Diagnoses Final diagnoses:  Fall, initial encounter  Facial laceration, initial encounter     @PCDICTATION @    Joss Friedel, Wyn Forster, MD 03/16/23 2000

## 2023-03-16 NOTE — ED Notes (Signed)
Called ptar for pt eta 30

## 2023-03-16 NOTE — ED Triage Notes (Signed)
Patient arrives from [facility] where she was found on the floor after unknown downtime. EMS reports A/O x 2 on their arrival. Appears to have hit her head on the nightstand during the fall. No blood thinners.

## 2023-03-23 ENCOUNTER — Telehealth: Payer: Self-pay | Admitting: Internal Medicine

## 2023-03-23 NOTE — Telephone Encounter (Signed)
Beth from [facility] states the pt rolled out of bed last night, landed on the floor, and hit her head on the way down.   Pt had a Band-Aid on her R forehead. Beth would like verbal orders for wound care with possibly zeroform or dry dressing.    Please call beth to confirm: [phone]

## 2023-03-24 NOTE — Telephone Encounter (Signed)
Beth with [facility] called back requesting verbals to continue nursing for 1 time a week for 3 weeks. Best callback is [phone].

## 2023-03-28 NOTE — Telephone Encounter (Signed)
Noted.  Okay nursing visits to continue.  Take the patient to ER if problems.  Thank you

## 2023-03-28 NOTE — Telephone Encounter (Signed)
Spoke with Beth from [facility] and was able to to give her the verbal ok for nursing for 1 time a week for 3 weeks a/w instructions for dizziness... Take the patient to ER if problems.

## 2023-04-07 ENCOUNTER — Encounter: Payer: Self-pay | Admitting: Internal Medicine

## 2023-04-07 ENCOUNTER — Ambulatory Visit: Payer: Medicare HMO | Admitting: Emergency Medicine

## 2023-04-07 ENCOUNTER — Ambulatory Visit (INDEPENDENT_AMBULATORY_CARE_PROVIDER_SITE_OTHER): Payer: Medicare PPO | Admitting: Internal Medicine

## 2023-04-07 VITALS — BP 118/62 | HR 60 | Temp 98.9°F | Ht 63.78 in

## 2023-04-07 DIAGNOSIS — E538 Deficiency of other specified B group vitamins: Secondary | ICD-10-CM | POA: Diagnosis not present

## 2023-04-07 DIAGNOSIS — E559 Vitamin D deficiency, unspecified: Secondary | ICD-10-CM | POA: Diagnosis not present

## 2023-04-07 DIAGNOSIS — D649 Anemia, unspecified: Secondary | ICD-10-CM | POA: Diagnosis not present

## 2023-04-07 DIAGNOSIS — E119 Type 2 diabetes mellitus without complications: Secondary | ICD-10-CM

## 2023-04-07 DIAGNOSIS — E1142 Type 2 diabetes mellitus with diabetic polyneuropathy: Secondary | ICD-10-CM | POA: Diagnosis not present

## 2023-04-07 DIAGNOSIS — I10 Essential (primary) hypertension: Secondary | ICD-10-CM

## 2023-04-07 DIAGNOSIS — N1832 Chronic kidney disease, stage 3b: Secondary | ICD-10-CM

## 2023-04-07 DIAGNOSIS — R296 Repeated falls: Secondary | ICD-10-CM | POA: Diagnosis not present

## 2023-04-07 DIAGNOSIS — N183 Chronic kidney disease, stage 3 unspecified: Secondary | ICD-10-CM | POA: Insufficient documentation

## 2023-04-07 LAB — COMPREHENSIVE METABOLIC PANEL
ALT: 8 U/L (ref 0–35)
AST: 16 U/L (ref 0–37)
Albumin: 3.4 g/dL — ABNORMAL LOW (ref 3.5–5.2)
Alkaline Phosphatase: 54 U/L (ref 39–117)
BUN: 27 mg/dL — ABNORMAL HIGH (ref 6–23)
CO2: 29 meq/L (ref 19–32)
Calcium: 9.2 mg/dL (ref 8.4–10.5)
Chloride: 105 meq/L (ref 96–112)
Creatinine, Ser: 1.15 mg/dL (ref 0.40–1.20)
GFR: 41.3 mL/min — ABNORMAL LOW (ref >60.00)
Glucose, Bld: 132 mg/dL — ABNORMAL HIGH (ref 70–99)
Potassium: 4.9 meq/L (ref 3.5–5.1)
Sodium: 141 meq/L (ref 135–145)
Total Bilirubin: 0.3 mg/dL (ref 0.2–1.2)
Total Protein: 6.8 g/dL (ref 6.0–8.3)

## 2023-04-07 LAB — CBC WITH DIFFERENTIAL/PLATELET
Basophils Absolute: 0 10*3/uL (ref 0.0–0.1)
Basophils Relative: 0.6 % (ref 0.0–3.0)
Eosinophils Absolute: 0.2 10*3/uL (ref 0.0–0.7)
Eosinophils Relative: 2 % (ref 0.0–5.0)
HCT: 26.7 % — ABNORMAL LOW (ref 36.0–46.0)
Hemoglobin: 8.3 g/dL — ABNORMAL LOW (ref 12.0–15.0)
Lymphocytes Relative: 27.7 % (ref 12.0–46.0)
Lymphs Abs: 2.1 10*3/uL (ref 0.7–4.0)
MCHC: 31.1 g/dL (ref 30.0–36.0)
MCV: 81.7 fL (ref 78.0–100.0)
Monocytes Absolute: 0.6 10*3/uL (ref 0.1–1.0)
Monocytes Relative: 8.6 % (ref 3.0–12.0)
Neutro Abs: 4.6 10*3/uL (ref 1.4–7.7)
Neutrophils Relative %: 61.1 % (ref 43.0–77.0)
Platelets: 262 10*3/uL (ref 150.0–400.0)
RBC: 3.27 Mil/uL — ABNORMAL LOW (ref 3.87–5.11)
RDW: 16.6 % — ABNORMAL HIGH (ref 11.5–15.5)
WBC: 7.5 10*3/uL (ref 4.0–10.5)

## 2023-04-07 LAB — HEMOGLOBIN A1C: Hgb A1c MFr Bld: 5.6 % (ref 4.6–6.5)

## 2023-04-07 LAB — VITAMIN B12: Vitamin B-12: 273 pg/mL (ref 211–911)

## 2023-04-07 LAB — T4, FREE: Free T4: 0.79 ng/dL (ref 0.60–1.60)

## 2023-04-07 LAB — TSH: TSH: 4.53 u[IU]/mL (ref 0.35–5.50)

## 2023-04-07 LAB — VITAMIN D 25 HYDROXY (VIT D DEFICIENCY, FRACTURES): VITD: 67.32 ng/mL (ref 30.00–100.00)

## 2023-04-07 NOTE — Assessment & Plan Note (Signed)
They are using Hoyer left at the [employer] She will need this equipment at home She is wheelchair-bound Will need to correct anemia

## 2023-04-07 NOTE — Progress Notes (Signed)
Subjective:  Patient ID: Holly Banks, female    DOB: [birth year]  Age: 89 y.o. MRN: [id number]  CC: New Patient (Initial Visit) (Pt had a fall while in facility and states her rt eyebrow is still sore and has not healed so well.)   HPI Holly Banks presents to re - establish primary care, not seen in over 3 years.  She moved to [place] temporarily.  Now she is back. She is currently residing at [facility], planning to move back in with her daughter [name].  [name] has to travel a lot for work. She is status post recent fall She was hospitalized for 2 weeks in [place] for lower GI bleed and anemia a month or 2 ago.  As I understand no source of anemia was found.   Outpatient Medications Prior to Visit  Medication Sig Dispense Refill   aspirin EC 81 MG tablet Take 81 mg by mouth daily. Swallow whole.     Cholecalciferol (VITAMIN D) 50 MCG (2000 UT) CAPS Take 2,000 Units by mouth daily.     losartan (COZAAR) 50 MG tablet TAKE 1 TABLET EVERY DAY  FOR  HYPERTENSION 90 tablet 3   metFORMIN (GLUCOPHAGE) 500 MG tablet Take 1 tablet (500 mg total) by mouth 2 (two) times daily with a meal. Must keep scheduled appt for future refills (Patient taking differently: Take 500 mg by mouth daily.) 180 tablet 3   No facility-administered medications prior to visit.    ROS: Review of Systems  Constitutional:  Positive for fatigue. Negative for activity change, appetite change, chills, diaphoresis, fever and unexpected weight change.  HENT:  Negative for congestion, dental problem, ear pain, hearing loss, mouth sores, postnasal drip, sinus pressure, sneezing, sore throat and voice change.   Eyes:  Negative for pain and visual disturbance.  Respiratory:  Negative for cough, chest tightness, wheezing and stridor.   Cardiovascular:  Negative for chest pain, palpitations and leg swelling.  Gastrointestinal:  Negative for abdominal distention, abdominal pain, blood  in stool, nausea, rectal pain and vomiting.  Genitourinary:  Negative for decreased urine volume, difficulty urinating, dysuria, frequency, hematuria, menstrual problem, vaginal bleeding, vaginal discharge and vaginal pain.  Musculoskeletal:  Positive for arthralgias, back pain and gait problem. Negative for joint swelling and neck pain.  Skin:  Negative for color change, rash and wound.  Neurological:  Positive for weakness. Negative for dizziness, tremors, syncope, speech difficulty and light-headedness.  Hematological:  Negative for adenopathy.  Psychiatric/Behavioral:  Positive for decreased concentration. Negative for behavioral problems, confusion, dysphoric mood, hallucinations, sleep disturbance and suicidal ideas. The patient is not nervous/anxious and is not hyperactive.     Objective:  BP 118/62 (BP Location: Left Arm, Patient Position: Sitting, Cuff Size: Normal)   Pulse 60   Temp 98.9 F (37.2 C) (Oral)   Ht 5' 3.78" (1.62 m)   SpO2 98%   BMI 27.74 kg/m   BP Readings from Last 3 Encounters:  04/07/23 118/62  03/16/23 (!) 146/72  02/24/23 (!) 179/77    Wt Readings from Last 3 Encounters:  02/20/23 160 lb 7.9 oz (72.8 kg)  01/30/19 188 lb (85.3 kg)  07/12/18 202 lb (91.6 kg)    Physical Exam Constitutional:      General: She is not in acute distress.    Appearance: Normal appearance. She is well-developed. She is obese. She is not toxic-appearing.  HENT:     Head: Normocephalic.     Right Ear: External ear normal.  Left Ear: External ear normal.     Nose: Nose normal.  Eyes:     General:        Right eye: No discharge.        Left eye: No discharge.     Conjunctiva/sclera: Conjunctivae normal.     Pupils: Pupils are equal, round, and reactive to light.  Neck:     Thyroid: No thyromegaly.     Vascular: No JVD.     Trachea: No tracheal deviation.  Cardiovascular:     Rate and Rhythm: Normal rate and regular rhythm.     Heart sounds: Normal heart sounds.   Pulmonary:     Effort: No respiratory distress.     Breath sounds: No stridor. No wheezing.  Abdominal:     General: Bowel sounds are normal. There is no distension.     Palpations: Abdomen is soft. There is no mass.     Tenderness: There is no abdominal tenderness. There is no guarding or rebound.  Musculoskeletal:        General: No tenderness.     Cervical back: Normal range of motion and neck supple. No rigidity.  Lymphadenopathy:     Cervical: No cervical adenopathy.  Skin:    Findings: No erythema or rash.  Neurological:     Mental Status: She is alert.     Cranial Nerves: No cranial nerve deficit.     Motor: No abnormal muscle tone.     Coordination: Coordination abnormal.     Gait: Gait abnormal.     Deep Tendon Reflexes: Reflexes normal.  Psychiatric:        Behavior: Behavior normal.        Thought Content: Thought content normal.        Judgment: Judgment normal.   The patient is in a wheelchair.  Appears comfortable. Lower extremities without edema    A total time of 60 minutes was spent preparing to see the patient, reviewing tests, x-rays, operative reports and other medical records.  Also, obtaining history and performing comprehensive physical exam.  Additionally, counseling the patient regarding the above listed issues -anemia, renal insufficiency, falls.   Finally, documenting clinical information in the health records, coordination of care, educating the patient. It is a complex case.  Lab Results  Component Value Date   WBC 7.5 04/07/2023   HGB 8.3 Repeated and verified X2. (L) 04/07/2023   HCT 26.7 Repeated and verified X2. (L) 04/07/2023   PLT 262.0 04/07/2023   GLUCOSE 132 (H) 04/07/2023   CHOL 198 05/04/2011   TRIG 116.0 05/04/2011   HDL 62.30 05/04/2011   LDLDIRECT 126.1 03/31/2010   LDLCALC 113 (H) 05/04/2011   ALT 8 04/07/2023   AST 16 04/07/2023   NA 141 04/07/2023   K 4.9 04/07/2023   CL 105 04/07/2023   CREATININE 1.15 04/07/2023   BUN  27 (H) 04/07/2023   CO2 29 04/07/2023   TSH 4.53 04/07/2023   INR 2.2 (H) 10/25/2007   HGBA1C 5.6 04/07/2023   MICROALBUR 11.9 (H) 08/23/2016    CT CERVICAL SPINE WO CONTRAST  Result Date: 03/16/2023 CLINICAL DATA:  89 year old female status post fall from bed. EXAM: CT CERVICAL SPINE WITHOUT CONTRAST TECHNIQUE: Multidetector CT imaging of the cervical spine was performed without intravenous contrast. Multiplanar CT image reconstructions were also generated. RADIATION DOSE REDUCTION: This exam was performed according to the departmental dose-optimization program which includes automated exposure control, adjustment of the mA and/or kV according to patient size and/or  use of iterative reconstruction technique. COMPARISON:  Head CT today.  Cervical spine CT 05/26/2009. FINDINGS: Alignment: Mildly improved cervical lordosis compared to 2010. Cervicothoracic junction alignment is within normal limits. Bilateral posterior element alignment is within normal limits. Skull base and vertebrae: Bone mineralization is within normal limits for age. Visualized skull base is intact. No atlanto-occipital dissociation. C1 and C2 appear highly degenerated, but intact and aligned. Widespread degenerative appearing cervical spinal ankylosis, detailed below. No acute osseous abnormality identified. Soft tissues and spinal canal: No prevertebral fluid or swelling. No visible canal hematoma. Negative visible noncontrast neck soft tissues, calcified carotid bifurcation atherosclerosis. Disc levels: Development of degenerative cervical ankylosis since 2010 at C2-C3, C3-C4, C6-C7, C7-T1. Underlying advanced chronic disc and endplate degeneration, partially calcified ligament flavum hypertrophy throughout. Multilevel degenerative cervical spinal stenosis, up to moderate. Upper chest: Partially visible thoracic spine interbody ankylosis beginning at T3 from flowing endplate osteophytes. Maintained upper thoracic vertebral height.  Upper lungs are clear. Calcified aortic atherosclerosis. Partially visible severe left glenohumeral and moderate bilateral sternoclavicular joint degeneration. IMPRESSION: 1. No acute traumatic injury identified in the cervical spine. 2. Advanced cervical spine degeneration superimposed on widespread degenerative cervical ankylosis. Multilevel cervical spinal stenosis, probably moderate. 3. Partially visible thoracic spine ankylosis. Aortic Atherosclerosis (ICD10-I70.0). Electronically Signed   By: Odessa Fleming M.D.   On: 03/16/2023 08:29   DG Pelvis Portable  Result Date: 03/16/2023 CLINICAL DATA:  89 year old female status post fall from bed. EXAM: PORTABLE PELVIS 1-2 VIEWS COMPARISON:  CT Abdomen and Pelvis 02/16/2017. FINDINGS: Portable AP supine view at 0731 hours. Rotated to the right. Chronic bilateral hip arthroplasty. Pelvis osteopenia. Large chronic calcified uterine fibroids. Nonobstructed visible bowel gas pattern. Retained stool in the pelvis. No acute osseous abnormality identified. Calcified iliofemoral atherosclerosis. IMPRESSION: 1. No acute fracture or dislocation identified on AP rotated view of the pelvis. Partially visible chronic bilateral hip arthroplasty. If there is lateralizing hip pain, then recommend dedicated hip series. 2. Large calcified chronic uterine fibroids. Calcified femoral artery atherosclerosis. Electronically Signed   By: Odessa Fleming M.D.   On: 03/16/2023 08:25   DG Chest Portable 1 View  Result Date: 03/16/2023 CLINICAL DATA:  89 year old female status post fall from bed. EXAM: PORTABLE CHEST 1 VIEW COMPARISON:  Portable chest 02/19/2023 and earlier. FINDINGS: Portable AP semi upright view at 0734 hours. Stable mediastinal contours with mild tortuosity of the thoracic aorta. Lower lung volumes. But Allowing for portable technique the lungs are clear. No pneumothorax or pleural effusion. Advanced chronic appearing changes at both shoulders. Bone mineralization is within normal  limits for age. No acute osseous abnormality identified. IMPRESSION: No acute cardiopulmonary abnormality or acute traumatic injury identified. Electronically Signed   By: Odessa Fleming M.D.   On: 03/16/2023 08:24   CT HEAD WO CONTRAST ( )  Result Date: 03/16/2023 CLINICAL DATA:  89 year old female status post fall from bed. EXAM: CT HEAD WITHOUT CONTRAST TECHNIQUE: Contiguous axial images were obtained from the base of the skull through the vertex without intravenous contrast. RADIATION DOSE REDUCTION: This exam was performed according to the departmental dose-optimization program which includes automated exposure control, adjustment of the mA and/or kV according to patient size and/or use of iterative reconstruction technique. COMPARISON:  Head CT 03/17/2011. FINDINGS: Brain: New but chronic appearing lacunar infarct of the left corona radiata and lentiform since 2012. No midline shift, ventriculomegaly, mass effect, evidence of mass lesion, intracranial hemorrhage or evidence of cortically based acute infarction. Gray-white matter differentiation elsewhere is within normal limits.  New anterior, mesial temporal lobe atrophy since 2012. Vascular: No suspicious intracranial vascular hyperdensity. Calcified atherosclerosis at the skull base. Skull: Stable.  No acute osseous abnormality identified. Sinuses/Orbits: Paranasal sinuses remain well aerated. Tympanic cavities and mastoids appear clear. Other: Bilateral forehead, periorbital scalp soft tissue hematoma and contusion greater on the right (series 4 image 32). Intraorbital soft tissues appear to remain intact, chronic postoperative changes to the globes. No scalp soft tissue gas. IMPRESSION: 1. Right greater than left forehead, periorbital scalp soft tissue injury. No skull fracture or acute intracranial abnormality identified. 2. Chronic appearing lacunar infarct of the left corona radiata and lentiform, new since 2012. Electronically Signed   By: Odessa Fleming M.D.    On: 03/16/2023 08:23    Assessment & Plan:   Problem List Items Addressed This Visit     Non-insulin dependent type 2 diabetes mellitus (HCC)    Obtain hemoglobin A1c      Vitamin B 12 deficiency - Primary    Continue with vitamin B12.  Obtain vitamin B12 level      Relevant Orders   Vitamin B12 (Completed)   Vitamin D deficiency    Continue on vitamin D.  Obtain vitamin D level      Relevant Orders   VITAMIN D 25 Hydroxy (Vit-D Deficiency, Fractures) (Completed)   OBESITY, MORBID    Cut back on carbs.  Try to lose a few pounds They are using Hoyer left at the [employer] She will need this equipment at home She is wheelchair-bound       Essential hypertension    BP Readings from Last 3 Encounters:  04/07/23 118/62  03/16/23 (!) 146/72  02/24/23 (!) 179/77  Monitor lab work-obtain CMET       Relevant Orders   CBC with Differential/Platelet (Completed)   Comprehensive metabolic panel (Completed)   Hemoglobin A1c (Completed)   Iron, TIBC and Ferritin Panel   TSH (Completed)   T4, free (Completed)   Vitamin B12 (Completed)   VITAMIN D 25 Hydroxy (Vit-D Deficiency, Fractures) (Completed)   Diabetic neuropathy (HCC)   Relevant Orders   CBC with Differential/Platelet (Completed)   Comprehensive metabolic panel (Completed)   Hemoglobin A1c (Completed)   Iron, TIBC and Ferritin Panel   TSH (Completed)   T4, free (Completed)   Vitamin B12 (Completed)   VITAMIN D 25 Hydroxy (Vit-D Deficiency, Fractures) (Completed)   Chronic anemia    She was hospitalized for 2 weeks in [place] for lower GI bleed and anemia a month or 2 ago.  As I understand no source of anemia was found. Obtain CBC, iron studies.  Her recent hemoglobin in the ER was 8.0      Relevant Orders   CBC with Differential/Platelet (Completed)   Comprehensive metabolic panel (Completed)   Hemoglobin A1c (Completed)   Iron, TIBC and Ferritin Panel   TSH (Completed)   T4, free (Completed)    Vitamin B12 (Completed)   VITAMIN D 25 Hydroxy (Vit-D Deficiency, Fractures) (Completed)   Falls    They are using Hoyer left at the [employer] She will need this equipment at home She is wheelchair-bound Will need to correct anemia      CKD (chronic kidney disease) stage 3, GFR 30-59 ml/min (HCC)    Hydrate well.  Monitor GFR         No orders of the defined types were placed in this encounter.     Follow-up: Return in about 3 months (around 07/08/2023) for a  follow-up visit.  Sonda Primes, MD

## 2023-04-07 NOTE — Assessment & Plan Note (Signed)
Hydrate well ?Monitor GFR ?

## 2023-04-07 NOTE — Assessment & Plan Note (Signed)
She was hospitalized for 2 weeks in [place] for lower GI bleed and anemia a month or 2 ago.  As I understand no source of anemia was found. Obtain CBC, iron studies.  Her recent hemoglobin in the ER was 8.0

## 2023-04-07 NOTE — Assessment & Plan Note (Signed)
Obtain hemoglobin A1c 

## 2023-04-07 NOTE — Assessment & Plan Note (Signed)
BP Readings from Last 3 Encounters:  04/07/23 118/62  03/16/23 (!) 146/72  02/24/23 (!) 179/77  Monitor lab work-obtain CMET

## 2023-04-07 NOTE — Assessment & Plan Note (Signed)
Continue on vitamin D.  Obtain vitamin D level

## 2023-04-07 NOTE — Assessment & Plan Note (Signed)
Continue with vitamin B12.  Obtain vitamin B12 level

## 2023-04-07 NOTE — Assessment & Plan Note (Signed)
Cut back on carbs.  Try to lose a few pounds They are using Hoyer left at the [employer] She will need this equipment at home She is wheelchair-bound

## 2023-04-08 LAB — IRON,TIBC AND FERRITIN PANEL
%SAT: 10 % — ABNORMAL LOW (ref 16–45)
Ferritin: 22 ng/mL (ref 16–288)
Iron: 27 ug/dL — ABNORMAL LOW (ref 45–160)
TIBC: 264 ug/dL (ref 250–450)

## 2023-04-11 ENCOUNTER — Other Ambulatory Visit: Payer: Self-pay | Admitting: Internal Medicine

## 2023-04-11 MED ORDER — B COMPLEX VITAMINS PO CAPS
1.0000 | ORAL_CAPSULE | Freq: Every day | ORAL | 3 refills | Status: AC
Start: 2023-04-11 — End: ?

## 2023-04-11 MED ORDER — FERROUS SULFATE 325 (65 FE) MG PO TABS: 325.0000 mg | ORAL_TABLET | Freq: Every day | ORAL | 1 refills | Status: DC

## 2023-05-12 ENCOUNTER — Other Ambulatory Visit: Payer: Self-pay

## 2023-05-12 ENCOUNTER — Telehealth: Payer: Self-pay | Admitting: Internal Medicine

## 2023-05-12 NOTE — Telephone Encounter (Signed)
Prescription Request  05/12/2023  LOV: 04/07/2023  What is the name of the medication or equipment?  ferrous sulfate 325 (65 FE) MG tablet    Have you contacted your pharmacy to request a refill? No   Which pharmacy would you like this sent to?   [facility] [address] [place], [place], [place] [address] [phone]  Patient notified that their request is being sent to the clinical staff for review and that they should receive a response within 2 business days.   Please advise at Mobile [phone] (mobile)

## 2023-05-13 ENCOUNTER — Other Ambulatory Visit: Payer: Self-pay

## 2023-05-13 MED ORDER — FERROUS SULFATE 325 (65 FE) MG PO TABS: 325.0000 mg | ORAL_TABLET | Freq: Every day | ORAL | 1 refills | Status: AC

## 2023-07-04 ENCOUNTER — Ambulatory Visit: Payer: Medicare PPO | Admitting: Internal Medicine

## 2023-11-07 ENCOUNTER — Other Ambulatory Visit: Payer: Self-pay | Admitting: Internal Medicine

## 2023-11-07 NOTE — Telephone Encounter (Unsigned)
 Copied from CRM [id number]. Topic: Clinical - Medication Refill >> Nov 07, 2023 12:06 PM Alyse July wrote: Medication: losartan  (COZAAR ) 50 MG tablet   Has the patient contacted their pharmacy? Yes (Agent: If no, request that the patient contact the pharmacy for the refill. If patient does not wish to contact the pharmacy document the reason why and proceed with request.) (Agent: If yes, when and what did the pharmacy advise?)  This is the patient's preferred pharmacy:   [phone], [address] [place] [address] Phone: [phone] Fax: [phone]  Is this the correct pharmacy for this prescription? Yes If no, delete pharmacy and type the correct one.   Has the prescription been filled recently? No  Is the patient out of the medication? Yes  Has the patient been seen for an appointment in the last year OR does the patient have an upcoming appointment? Yes  Can we respond through MyChart? No  Agent: Please be advised that Rx refills may take up to 3 business days. We ask that you follow-up with your pharmacy.

## 2023-11-09 MED ORDER — LOSARTAN POTASSIUM 50 MG PO TABS: ORAL_TABLET | ORAL | 3 refills | Status: AC

## 2023-11-14 ENCOUNTER — Encounter: Payer: Self-pay | Admitting: Internal Medicine

## 2023-11-14 ENCOUNTER — Other Ambulatory Visit: Payer: Self-pay

## 2023-11-14 ENCOUNTER — Ambulatory Visit (INDEPENDENT_AMBULATORY_CARE_PROVIDER_SITE_OTHER): Admitting: Internal Medicine

## 2023-11-14 ENCOUNTER — Encounter (HOSPITAL_COMMUNITY): Payer: Self-pay

## 2023-11-14 ENCOUNTER — Emergency Department (HOSPITAL_COMMUNITY)
Admission: EM | Admit: 2023-11-14 | Discharge: 2023-11-16 | Disposition: A | Discharge status: Home / Self Care | Attending: Emergency Medicine | Admitting: Emergency Medicine

## 2023-11-14 ENCOUNTER — Emergency Department (HOSPITAL_COMMUNITY)

## 2023-11-14 VITALS — Ht 63.78 in

## 2023-11-14 DIAGNOSIS — Z7984 Long term (current) use of oral hypoglycemic drugs: Secondary | ICD-10-CM | POA: Insufficient documentation

## 2023-11-14 DIAGNOSIS — R627 Adult failure to thrive: Secondary | ICD-10-CM | POA: Diagnosis not present

## 2023-11-14 DIAGNOSIS — R197 Diarrhea, unspecified: Secondary | ICD-10-CM | POA: Diagnosis not present

## 2023-11-14 DIAGNOSIS — I10 Essential (primary) hypertension: Secondary | ICD-10-CM | POA: Diagnosis not present

## 2023-11-14 DIAGNOSIS — L89302 Pressure ulcer of unspecified buttock, stage 2: Secondary | ICD-10-CM | POA: Diagnosis not present

## 2023-11-14 DIAGNOSIS — E119 Type 2 diabetes mellitus without complications: Secondary | ICD-10-CM | POA: Insufficient documentation

## 2023-11-14 DIAGNOSIS — N1832 Chronic kidney disease, stage 3b: Secondary | ICD-10-CM | POA: Diagnosis not present

## 2023-11-14 DIAGNOSIS — Z7982 Long term (current) use of aspirin: Secondary | ICD-10-CM | POA: Insufficient documentation

## 2023-11-14 DIAGNOSIS — L8942 Pressure ulcer of contiguous site of back, buttock and hip, stage 2: Secondary | ICD-10-CM | POA: Diagnosis not present

## 2023-11-14 DIAGNOSIS — R531 Weakness: Secondary | ICD-10-CM

## 2023-11-14 DIAGNOSIS — Z79899 Other long term (current) drug therapy: Secondary | ICD-10-CM | POA: Insufficient documentation

## 2023-11-14 DIAGNOSIS — Z4800 Encounter for change or removal of nonsurgical wound dressing: Secondary | ICD-10-CM | POA: Diagnosis present

## 2023-11-14 LAB — COMPREHENSIVE METABOLIC PANEL WITH GFR
ALT: 14 U/L (ref 0–44)
AST: 25 U/L (ref 15–41)
Albumin: 3.5 g/dL (ref 3.5–5.0)
Alkaline Phosphatase: 54 U/L (ref 38–126)
Anion gap: 9 (ref 5–15)
BUN: 29 mg/dL — ABNORMAL HIGH (ref 8–23)
CO2: 27 mmol/L (ref 22–32)
Calcium: 9.4 mg/dL (ref 8.9–10.3)
Chloride: 103 mmol/L (ref 98–111)
Creatinine, Ser: 1.21 mg/dL — ABNORMAL HIGH (ref 0.44–1.00)
GFR, Estimated: 42 mL/min — ABNORMAL LOW (ref >60)
Glucose, Bld: 142 mg/dL — ABNORMAL HIGH (ref 70–99)
Potassium: 4.7 mmol/L (ref 3.5–5.1)
Sodium: 139 mmol/L (ref 135–145)
Total Bilirubin: 0.9 mg/dL (ref 0.0–1.2)
Total Protein: 7.5 g/dL (ref 6.5–8.1)

## 2023-11-14 LAB — CBC WITH DIFFERENTIAL/PLATELET
Abs Immature Granulocytes: 0.01 10*3/uL (ref 0.00–0.07)
Basophils Absolute: 0 10*3/uL (ref 0.0–0.1)
Basophils Relative: 0 %
Eosinophils Absolute: 0.3 10*3/uL (ref 0.0–0.5)
Eosinophils Relative: 4 %
HCT: 32.5 % — ABNORMAL LOW (ref 36.0–46.0)
Hemoglobin: 10 g/dL — ABNORMAL LOW (ref 12.0–15.0)
Immature Granulocytes: 0 %
Lymphocytes Relative: 45 %
Lymphs Abs: 3.1 10*3/uL (ref 0.7–4.0)
MCH: 25.6 pg — ABNORMAL LOW (ref 26.0–34.0)
MCHC: 30.8 g/dL (ref 30.0–36.0)
MCV: 83.3 fL (ref 80.0–100.0)
Monocytes Absolute: 0.7 10*3/uL (ref 0.1–1.0)
Monocytes Relative: 10 %
Neutro Abs: 2.9 10*3/uL (ref 1.7–7.7)
Neutrophils Relative %: 41 %
Platelets: 214 10*3/uL (ref 150–400)
RBC: 3.9 MIL/uL (ref 3.87–5.11)
RDW: 13.7 % (ref 11.5–15.5)
WBC: 7 10*3/uL (ref 4.0–10.5)
nRBC: 0 % (ref 0.0–0.2)

## 2023-11-14 LAB — URINALYSIS, W/ REFLEX TO CULTURE (INFECTION SUSPECTED)
Bilirubin Urine: NEGATIVE
Glucose, UA: NEGATIVE mg/dL
Hgb urine dipstick: NEGATIVE
Ketones, ur: NEGATIVE mg/dL
Nitrite: NEGATIVE
Protein, ur: NEGATIVE mg/dL
Specific Gravity, Urine: 1.005 (ref 1.005–1.030)
WBC, UA: >50 WBC/hpf (ref 0–5)
pH: 7 (ref 5.0–8.0)

## 2023-11-14 LAB — C-REACTIVE PROTEIN: CRP: 0.6 mg/dL (ref <1.0)

## 2023-11-14 LAB — SEDIMENTATION RATE: Sed Rate: 39 mm/h — ABNORMAL HIGH (ref 0–22)

## 2023-11-14 MED ORDER — LACTATED RINGERS IV BOLUS
250.0000 mL | Freq: Once | INTRAVENOUS | Status: AC
  Administered 2023-11-14: 250 mL via INTRAVENOUS

## 2023-11-14 MED ORDER — ACETAMINOPHEN 500 MG PO TABS
1000.0000 mg | ORAL_TABLET | Freq: Once | ORAL | Status: AC
  Administered 2023-11-14: 1000 mg via ORAL
  Filled 2023-11-14: qty 2

## 2023-11-14 MED ORDER — LOSARTAN POTASSIUM 50 MG PO TABS
50.0000 mg | ORAL_TABLET | Freq: Once | ORAL | Status: AC
  Administered 2023-11-14: 50 mg via ORAL
  Filled 2023-11-14: qty 1

## 2023-11-14 NOTE — Assessment & Plan Note (Addendum)
 Worse Pt needs a lot of assistance to be cleaned, moved

## 2023-11-14 NOTE — ED Provider Triage Note (Signed)
 Emergency Medicine Provider Triage Evaluation Note  Holly Banks , a 89 y.o. female  was evaluated in triage.  Pt complains of decubitus ulcers. New since being in respite care . Limited mobility, hx of same. No fevers  Review of Systems  Positive: wounds Negative: fever  Physical Exam  BP (!) 168/87 (BP Location: Left Arm)   Pulse 62   Temp 98.5 F (36.9 C) (Oral)   Resp 18   SpO2 100%  Gen:   Awake, no distress   Resp:  Normal effort  MSK:   Moves extremities without difficulty  Other:    Medical Decision Making  Medically screening exam initiated at 6:40 PM.  Appropriate orders placed.  Holly Banks was informed that the remainder of the evaluation will be completed by another provider, this initial triage assessment does not replace that evaluation, and the importance of remaining in the ED until their evaluation is complete.     Tama Fails, PA-C 11/14/23 [phone]

## 2023-11-14 NOTE — Assessment & Plan Note (Addendum)
  Multiple new ulcers - see pictures She is currently residing at [facility], planning to move back in with her daughter [name].  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 Will need a new care level now. Will send to [facility] ER - unable to send pt back home.... [name] ill likely need a wound care now/NHP later

## 2023-11-14 NOTE — ED Notes (Signed)
 This RN and ED tech Julianna at bedside to clean pt up and perform wound care. Pt had bowel movement, pt was cleaned up and had fresh brief and chux pad placed underneath her. This RN placed mepilex on the pts wounds per MD. L and R hip, underneath the L and R buttock crease where it meets the thigh, and center sacral. Pt was then repositioned comfortably in the bed.

## 2023-11-14 NOTE — Care Management (Signed)
 ED RNCM  received TOC consult for assistance with [facility] for wound care. Patient is apparently from a respite home with sacral wounds. Consult was placed for Wound Nurse for a wound eval. In the am.  Patient has had HH services in the past with [facility]. Once WOC eval is completed referral for [facility] can be arranged.  TOC will continue to follow for discharge planning.

## 2023-11-14 NOTE — Assessment & Plan Note (Addendum)
 She is currently residing at [facility], planning to move back in with her daughter Holly Banks.  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 Will need a new care level now. Will send to [facility] ER - unable to send pt back home.... [name] ill likely need a NHP

## 2023-11-14 NOTE — Progress Notes (Signed)
 Subjective:  Patient ID: Holly Banks, female    DOB: Jan 09, 1929  Age: 89 y.o. MRN: [id number]  CC: Medical Management of Chronic Issues (3 MNTH F/U)    Holly Banks presents for FTT, pressure sores   Outpatient Medications Prior to Visit  Medication Sig Dispense Refill   aspirin  EC 81 MG tablet Take 81 mg by mouth daily. Swallow whole.     b complex vitamins capsule Take 1 capsule by mouth daily. 100 capsule 3   Cholecalciferol  (VITAMIN D ) 50 MCG (2000 UT) CAPS Take 2,000 Units by mouth daily.     ferrous sulfate  325 (65 FE) MG tablet Take 1 tablet (325 mg total) by mouth daily. 90 tablet 1   hydrochlorothiazide (HYDRODIURIL) 12.5 MG tablet Take 12.5 mg by mouth daily.     losartan  (COZAAR ) 50 MG tablet TAKE 1 TABLET EVERY DAY  FOR  HYPERTENSION 90 tablet 3   metFORMIN  (GLUCOPHAGE ) 500 MG tablet Take 1 tablet (500 mg total) by mouth 2 (two) times daily with a meal. Must keep scheduled appt for future refills (Patient taking differently: Take 500 mg by mouth daily.) 180 tablet 3   No facility-administered medications prior to visit.    ROS: Review of Systems  Constitutional:  Positive for fatigue. Negative for activity change, appetite change, chills and unexpected weight change.  HENT:  Negative for congestion, mouth sores and sinus pressure.   Eyes:  Negative for visual disturbance.  Respiratory:  Negative for cough and chest tightness.   Gastrointestinal:  Negative for abdominal pain and nausea.  Genitourinary:  Negative for difficulty urinating, frequency, genital sores and vaginal pain.  Musculoskeletal:  Positive for arthralgias, back pain, gait problem and neck stiffness.  Skin:  Positive for rash and wound. Negative for pallor.  Neurological:  Negative for dizziness, tremors, weakness, numbness and headaches.  Hematological:  Does not bruise/bleed easily.  Psychiatric/Behavioral:  Negative for confusion, self-injury, sleep disturbance and suicidal ideas. The  patient is not nervous/anxious.     Objective:  Ht 5' 3.78" (1.62 m)   BMI 27.74 kg/m        BP Readings from Last 3 Encounters:  04/07/23 118/62  03/16/23 (!) 146/72  02/24/23 (!) 179/77    Wt Readings from Last 3 Encounters:  02/20/23 160 lb 7.9 oz (72.8 kg)  01/30/19 188 lb (85.3 kg)  07/12/18 202 lb (91.6 kg)    Physical Exam Constitutional:      General: She is not in acute distress.    Appearance: She is well-developed. She is obese.  HENT:     Head: Normocephalic.     Right Ear: External ear normal.     Left Ear: External ear normal.     Nose: Nose normal.  Eyes:     General:        Right eye: No discharge.        Left eye: No discharge.     Conjunctiva/sclera: Conjunctivae normal.     Pupils: Pupils are equal, round, and reactive to light.  Neck:     Thyroid : No thyromegaly.     Vascular: No JVD.     Trachea: No tracheal deviation.  Cardiovascular:     Rate and Rhythm: Normal rate and regular rhythm.     Heart sounds: Normal heart sounds.  Pulmonary:     Effort: No respiratory distress.     Breath sounds: No stridor. No wheezing.  Abdominal:     General: Bowel sounds are normal. There is no  distension.     Palpations: Abdomen is soft. There is no mass.     Tenderness: There is no abdominal tenderness. There is no guarding or rebound.  Musculoskeletal:        General: Tenderness present.     Cervical back: Normal range of motion and neck supple. No rigidity.     Right lower leg: No edema.     Left lower leg: No edema.  Lymphadenopathy:     Cervical: No cervical adenopathy.  Skin:    Findings: Lesion present. No erythema or rash.  Neurological:     Mental Status: Mental status is at baseline.     Cranial Nerves: No cranial nerve deficit.     Motor: Weakness present. No abnormal muscle tone.     Coordination: Coordination abnormal.     Gait: Gait abnormal.     Deep Tendon Reflexes: Reflexes abnormal.  Psychiatric:        Behavior: Behavior  normal.        Thought Content: Thought content normal.    In a w/c Pt had a large BM in the exam room - cleaned    A total time of 45 minutes was spent preparing to see the patient, reviewing tests, x-rays and other medical records.  Also, obtaining history and performing comprehensive physical exam.  Additionally, counseling the patient regarding the above listed issues.   Finally, documenting clinical information in the health records, coordination of care, educating the patient and her dtr re: FTT, ulcers. It is a complex case.The pt was sent to [facility] ER.   Lab Results  Component Value Date   WBC 7.5 04/07/2023   HGB 8.3 Repeated and verified X2. (L) 04/07/2023   HCT 26.7 Repeated and verified X2. (L) 04/07/2023   PLT 262.0 04/07/2023   GLUCOSE 132 (H) 04/07/2023   CHOL 198 05/04/2011   TRIG 116.0 05/04/2011   HDL 62.30 05/04/2011   LDLDIRECT 126.1 03/31/2010   LDLCALC 113 (H) 05/04/2011   ALT 8 04/07/2023   AST 16 04/07/2023   NA 141 04/07/2023   K 4.9 04/07/2023   CL 105 04/07/2023   CREATININE 1.15 04/07/2023   BUN 27 (H) 04/07/2023   CO2 29 04/07/2023   TSH 4.53 04/07/2023   INR 2.2 (H) 10/25/2007   HGBA1C 5.6 04/07/2023   MICROALBUR 11.9 (H) 08/23/2016    CT CERVICAL SPINE WO CONTRAST Result Date: 03/16/2023 CLINICAL DATA:  89 year old female status post fall from bed. EXAM: CT CERVICAL SPINE WITHOUT CONTRAST TECHNIQUE: Multidetector CT imaging of the cervical spine was performed without intravenous contrast. Multiplanar CT image reconstructions were also generated. RADIATION DOSE REDUCTION: This exam was performed according to the departmental dose-optimization program which includes automated exposure control, adjustment of the mA and/or kV according to patient size and/or use of iterative reconstruction technique. COMPARISON:  Head CT today.  Cervical spine CT 05/26/2009. FINDINGS: Alignment: Mildly improved cervical lordosis compared to 2010. Cervicothoracic junction  alignment is within normal limits. Bilateral posterior element alignment is within normal limits. Skull base and vertebrae: Bone mineralization is within normal limits for age. Visualized skull base is intact. No atlanto-occipital dissociation. C1 and C2 appear highly degenerated, but intact and aligned. Widespread degenerative appearing cervical spinal ankylosis, detailed below. No acute osseous abnormality identified. Soft tissues and spinal canal: No prevertebral fluid or swelling. No visible canal hematoma. Negative visible noncontrast neck soft tissues, calcified carotid bifurcation atherosclerosis. Disc levels: Development of degenerative cervical ankylosis since 2010 at C2-C3, C3-C4, C6-C7, C7-T1.  Underlying advanced chronic disc and endplate degeneration, partially calcified ligament flavum hypertrophy throughout. Multilevel degenerative cervical spinal stenosis, up to moderate. Upper chest: Partially visible thoracic spine interbody ankylosis beginning at T3 from flowing endplate osteophytes. Maintained upper thoracic vertebral height. Upper lungs are clear. Calcified aortic atherosclerosis. Partially visible severe left glenohumeral and moderate bilateral sternoclavicular joint degeneration. IMPRESSION: 1. No acute traumatic injury identified in the cervical spine. 2. Advanced cervical spine degeneration superimposed on widespread degenerative cervical ankylosis. Multilevel cervical spinal stenosis, probably moderate. 3. Partially visible thoracic spine ankylosis. Aortic Atherosclerosis (ICD10-I70.0). Electronically Signed   By: Marlise Simpers M.D.   On: 03/16/2023 08:29   DG Pelvis Portable Result Date: 03/16/2023 CLINICAL DATA:  89 year old female status post fall from bed. EXAM: PORTABLE PELVIS 1-2 VIEWS COMPARISON:  CT Abdomen and Pelvis 02/16/2017. FINDINGS: Portable AP supine view at 0731 hours. Rotated to the right. Chronic bilateral hip arthroplasty. Pelvis osteopenia. Large chronic calcified uterine  fibroids. Nonobstructed visible bowel gas pattern. Retained stool in the pelvis. No acute osseous abnormality identified. Calcified iliofemoral atherosclerosis. IMPRESSION: 1. No acute fracture or dislocation identified on AP rotated view of the pelvis. Partially visible chronic bilateral hip arthroplasty. If there is lateralizing hip pain, then recommend dedicated hip series. 2. Large calcified chronic uterine fibroids. Calcified femoral artery atherosclerosis. Electronically Signed   By: Marlise Simpers M.D.   On: 03/16/2023 08:25   DG Chest Portable 1 View Result Date: 03/16/2023 CLINICAL DATA:  89 year old female status post fall from bed. EXAM: PORTABLE CHEST 1 VIEW COMPARISON:  Portable chest 02/19/2023 and earlier. FINDINGS: Portable AP semi upright view at 0734 hours. Stable mediastinal contours with mild tortuosity of the thoracic aorta. Lower lung volumes. But Allowing for portable technique the lungs are clear. No pneumothorax or pleural effusion. Advanced chronic appearing changes at both shoulders. Bone mineralization is within normal limits for age. No acute osseous abnormality identified. IMPRESSION: No acute cardiopulmonary abnormality or acute traumatic injury identified. Electronically Signed   By: Marlise Simpers M.D.   On: 03/16/2023 08:24   CT HEAD WO CONTRAST ( ) Result Date: 03/16/2023 CLINICAL DATA:  89 year old female status post fall from bed. EXAM: CT HEAD WITHOUT CONTRAST TECHNIQUE: Contiguous axial images were obtained from the base of the skull through the vertex without intravenous contrast. RADIATION DOSE REDUCTION: This exam was performed according to the departmental dose-optimization program which includes automated exposure control, adjustment of the mA and/or kV according to patient size and/or use of iterative reconstruction technique. COMPARISON:  Head CT 03/17/2011. FINDINGS: Brain: New but chronic appearing lacunar infarct of the left corona radiata and lentiform since 2012. No  midline shift, ventriculomegaly, mass effect, evidence of mass lesion, intracranial hemorrhage or evidence of cortically based acute infarction. Gray-white matter differentiation elsewhere is within normal limits. New anterior, mesial temporal lobe atrophy since 2012. Vascular: No suspicious intracranial vascular hyperdensity. Calcified atherosclerosis at the skull base. Skull: Stable.  No acute osseous abnormality identified. Sinuses/Orbits: Paranasal sinuses remain well aerated. Tympanic cavities and mastoids appear clear. Other: Bilateral forehead, periorbital scalp soft tissue hematoma and contusion greater on the right (series 4 image 32). Intraorbital soft tissues appear to remain intact, chronic postoperative changes to the globes. No scalp soft tissue gas. IMPRESSION: 1. Right greater than left forehead, periorbital scalp soft tissue injury. No skull fracture or acute intracranial abnormality identified. 2. Chronic appearing lacunar infarct of the left corona radiata and lentiform, new since 2012. Electronically Signed   By: Marlise Simpers M.D.   On:  03/16/2023 08:23    Assessment & Plan:   Problem List Items Addressed This Visit     Weakness   Worse Pt needs a lot of assistance to be cleaned, moved      Decubitus ulcers - Primary    Multiple new ulcers - see pictures She is currently residing at [facility], planning to move back in with her daughter [name].  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 No place in the house yet - will need to build. Will need a new care level now. Will send to [facility] ER - unable to send pt back home.... Pierrette ill likely need a wound care now/NHP later      FTT (failure to thrive) in adult   She is currently residing at [facility], planning to move back in with her daughter [name].  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 No place in the house yet - will need to build. Will need a new care  level now. Will send to [facility] ER - unable to send pt back home.... Nafisa ill likely need a NHP       CKD (chronic kidney disease) stage 3, GFR 30-59 ml/min (HCC)   Hydrate well.  Monitor GFR         No orders of the defined types were placed in this encounter.     Follow-up: No follow-ups on file.  Anitra Barn, MD

## 2023-11-14 NOTE — ED Provider Notes (Signed)
 [place] EMERGENCY DEPARTMENT AT [facility] Provider Note   CSN: [id number] Arrival date & time: 11/14/23  1614     History {Add pertinent medical, surgical, social history, OB history to HPI:1} Chief Complaint  Patient presents with   Wound Check    Elda Dunkerson is a 89 y.o. female.  Patient is a 89 year old female with a history of diabetes, GERD, hypertension, bedbound or in a wheelchair and nonambulatory who is currently living in a respite care home because her daughter has to travel for work and being brought in today due to new wounds on her buttocks.  Her daughter reports when she went into respite care in November she did not have any wounds.  She was told just a few days ago that she had several wounds and they took her to her doctor today who sent her here for evaluation.  She reports that the respite home states they cannot take care of her if she has any wounds.  She has not been receiving any wound care to the area.  Her daughter reports otherwise she has been her normal self.  She has not had any confusion, vomiting or fevers.  She has been compliant with her medications except she has not had losartan  for the last week because the pharmacy did not have it.  She did have breakfast and lunch today but has not had dinner.  Her daughter did notice that she was having some loose stool today which was different from her baseline but otherwise no other acute issues.  She reports in the past she has had some deeper wounds especially over the coccyx but she had no wounds when she went into the facility.  Patient denies chest pain, shortness of breath, abdominal pain.  Her daughter reports she never drinks enough and does not think she urinates very often.  The history is provided by the patient, a relative and medical records.  Wound Check       Home Medications Prior to Admission medications   Medication Sig Start Date End Date Taking? Authorizing Provider   aspirin  EC 81 MG tablet Take 81 mg by mouth daily. Swallow whole.    [provider]  b complex vitamins capsule Take 1 capsule by mouth daily. 04/11/23   Plotnikov, Oakley Bellman, MD  Cholecalciferol  (VITAMIN D ) 50 MCG (2000 UT) CAPS Take 2,000 Units by mouth daily.    [provider]  ferrous sulfate  325 (65 FE) MG tablet Take 1 tablet (325 mg total) by mouth daily. 05/13/23 05/12/24  Plotnikov, Aleksei V, MD  hydrochlorothiazide (HYDRODIURIL) 12.5 MG tablet Take 12.5 mg by mouth daily. 07/27/23   [provider]  losartan  (COZAAR ) 50 MG tablet TAKE 1 TABLET EVERY DAY  FOR  HYPERTENSION 11/09/23   Plotnikov, Aleksei V, MD  metFORMIN  (GLUCOPHAGE ) 500 MG tablet Take 1 tablet (500 mg total) by mouth 2 (two) times daily with a meal. Must keep scheduled appt for future refills Patient taking differently: Take 500 mg by mouth daily. 07/12/18   Plotnikov, Aleksei V, MD      Allergies    Ditropan [oxybutynin] and Penicillins    Review of Systems   Review of Systems  Physical Exam Updated Vital Signs BP (!) 217/77   Pulse (!) 59   Temp 98.5 F (36.9 C) (Oral)   Resp 18   SpO2 99%  Physical Exam Vitals and nursing note reviewed.  Constitutional:      General: She is not in  acute distress.    Appearance: She is well-developed.  HENT:     Head: Normocephalic and atraumatic.  Eyes:     Pupils: Pupils are equal, round, and reactive to light.  Cardiovascular:     Rate and Rhythm: Normal rate and regular rhythm.     Heart sounds: Normal heart sounds. No murmur heard.    No friction rub.  Pulmonary:     Effort: Pulmonary effort is normal.     Breath sounds: Normal breath sounds. No wheezing or rales.  Abdominal:     General: Bowel sounds are normal. There is no distension.     Palpations: Abdomen is soft.     Tenderness: There is no abdominal tenderness. There is no guarding or rebound.  Genitourinary:    Comments: Loose stool present in the diaper.  Stage II decubitus  wounds over the coccyx and bilaterally at the gluteal fold.  No deep wounds present or tunneling.  No erythema, drainage or signs of infection Musculoskeletal:        General: No tenderness. Normal range of motion.     Right lower leg: No edema.     Left lower leg: No edema.     Comments: No edema  Skin:    General: Skin is warm and dry.     Findings: No rash.  Neurological:     Mental Status: She is alert and oriented to person, place, and time. Mental status is at baseline.     Cranial Nerves: No cranial nerve deficit.  Psychiatric:        Mood and Affect: Mood normal.        Behavior: Behavior normal.     ED Results / Procedures / Treatments   Labs (all labs ordered are listed, but only abnormal results are displayed) Labs Reviewed  COMPREHENSIVE METABOLIC PANEL WITH GFR  CBC WITH DIFFERENTIAL/PLATELET  URINALYSIS, W/ REFLEX TO CULTURE (INFECTION SUSPECTED)  SEDIMENTATION RATE  C-REACTIVE PROTEIN    EKG None  Radiology DG Pelvis 1-2 Views Result Date: 11/14/2023 CLINICAL DATA:  Decubitus wounds EXAM: PELVIS - 1 VIEW COMPARISON:  03/16/2019 FINDINGS: Multiple calcified uterine fibroids are seen. Bilateral hip replacement is again identified. Bony structures appear within normal limits. The sacrum of somewhat obscured by the calcified fibroids. No other focal abnormality is seen. IMPRESSION: Chronic changes stable from the prior exam. Electronically Signed   By: Violeta Grey M.D.   On: 11/14/2023 19:47    Procedures Procedures  {Document cardiac monitor, telemetry assessment procedure when appropriate:1}  Medications Ordered in ED Medications  losartan  (COZAAR ) tablet 50 mg (50 mg Oral Incomplete 11/14/23 2020)  acetaminophen  (TYLENOL ) tablet 1,000 mg (1,000 mg Oral Incomplete 11/14/23 2020)  lactated ringers  bolus 250 mL (has no administration in time range)    ED Course/ Medical Decision Making/ A&P   {   Click here for ABCD2, HEART and other calculatorsREFRESH Note  before signing :1}                              Medical Decision Making Amount and/or Complexity of Data Reviewed Labs: ordered.  Risk OTC drugs. Prescription drug management.   Pt with multiple medical problems and comorbidities and presenting today with a complaint that caries a high risk for morbidity and mortality.  Here today due to wounds in her buttocks and coccyx area.  Patient has stage II ulcers but no deep tunneling wounds or signs of infection  today.  Some of the wounds appeared to almost be skin tears from where the diaper is rubbing her.  She does have some loose stool which her daughter states is new today but otherwise patient is well-appearing and there are no further complaints.  Patient is noted to be hypertensive here today but most likely because she has not had her losartan  for the last 1 week.  Patient's daughter reports that the respite care place will not take the patient back because they do not provide any wound care.  Will check labs and ensure there is no need for admission.  Will then consult transition of care team.   {Document critical care time when appropriate:1} {Document review of labs and clinical decision tools ie heart score, Chads2Vasc2 etc:1}  {Document your independent review of radiology images, and any outside records:1} {Document your discussion with family members, caretakers, and with consultants:1} {Document social determinants of health affecting pt's care:1} {Document your decision making why or why not admission, treatments were needed:1} Final Clinical Impression(s) / ED Diagnoses Final diagnoses:  None    Rx / DC Orders ED Discharge Orders     None

## 2023-11-14 NOTE — Assessment & Plan Note (Signed)
Hydrate well ?Monitor GFR ?

## 2023-11-14 NOTE — Progress Notes (Signed)
 CM contacted patients. daughter Cheron Corning. CSW left a message requesting a call back.    CSW contacted [facility] with no answer.

## 2023-11-14 NOTE — ED Triage Notes (Signed)
 Pt arrives via POV with her Daughter. Pt resides at [facility]. Pt was seen by her pcp today and has multiple pressure injuries (uploaded pictures are present in epic from PCP). Pt is either wheelchair or bedbound all day. Pt denies any other symptoms. She is AxOx3, which is her baseline.

## 2023-11-14 NOTE — ED Provider Notes (Incomplete)
 [place] EMERGENCY DEPARTMENT AT [facility] Provider Note   CSN: [id number] Arrival date & time: 11/14/23  1614     History {Add pertinent medical, surgical, social history, OB history to HPI:1} Chief Complaint  Patient presents with  . Wound Check    Holly Banks is a 89 y.o. female.  Patient is a 89 year old female with a history of diabetes, GERD, hypertension, bedbound or in a wheelchair and nonambulatory who is currently living in a respite care home because her daughter has to travel for work and being brought in today due to new wounds on her buttocks.  Her daughter reports when she went into respite care in November she did not have any wounds.  She was told just a few days ago that she had several wounds and they took her to her doctor today who sent her here for evaluation.  She reports that the respite home states they cannot take care of her if she has any wounds.  She has not been receiving any wound care to the area.  Her daughter reports otherwise she has been her normal self.  She has not had any confusion, vomiting or fevers.  She has been compliant with her medications except she has not had losartan  for the last week because the pharmacy did not have it.  She did have breakfast and lunch today but has not had dinner.  Her daughter did notice that she was having some loose stool today which was different from her baseline but otherwise no other acute issues.  She reports in the past she has had some deeper wounds especially over the coccyx but she had no wounds when she went into the facility.  Patient denies chest pain, shortness of breath, abdominal pain.  Her daughter reports she never drinks enough and does not think she urinates very often.  The history is provided by the patient, a relative and medical records.  Wound Check       Home Medications Prior to Admission medications   Medication Sig Start Date End Date Taking? Authorizing Provider   aspirin  EC 81 MG tablet Take 81 mg by mouth daily. Swallow whole.    [provider]  b complex vitamins capsule Take 1 capsule by mouth daily. 04/11/23   Plotnikov, Oakley Bellman, MD  Cholecalciferol  (VITAMIN D ) 50 MCG (2000 UT) CAPS Take 2,000 Units by mouth daily.    [provider]  ferrous sulfate  325 (65 FE) MG tablet Take 1 tablet (325 mg total) by mouth daily. 05/13/23 05/12/24  Plotnikov, Aleksei V, MD  hydrochlorothiazide (HYDRODIURIL) 12.5 MG tablet Take 12.5 mg by mouth daily. 07/27/23   [provider]  losartan  (COZAAR ) 50 MG tablet TAKE 1 TABLET EVERY DAY  FOR  HYPERTENSION 11/09/23   Plotnikov, Aleksei V, MD  metFORMIN  (GLUCOPHAGE ) 500 MG tablet Take 1 tablet (500 mg total) by mouth 2 (two) times daily with a meal. Must keep scheduled appt for future refills Patient taking differently: Take 500 mg by mouth daily. 07/12/18   Plotnikov, Aleksei V, MD      Allergies    Ditropan [oxybutynin] and Penicillins    Review of Systems   Review of Systems  Physical Exam Updated Vital Signs BP (!) 167/77   Pulse 60   Temp 98 F (36.7 C) (Oral)   Resp 18   SpO2 100%  Physical Exam Vitals and nursing note reviewed.  Constitutional:      General: She is not in acute  distress.    Appearance: She is well-developed.  HENT:     Head: Normocephalic and atraumatic.  Eyes:     Pupils: Pupils are equal, round, and reactive to light.  Cardiovascular:     Rate and Rhythm: Normal rate and regular rhythm.     Heart sounds: Normal heart sounds. No murmur heard.    No friction rub.  Pulmonary:     Effort: Pulmonary effort is normal.     Breath sounds: Normal breath sounds. No wheezing or rales.  Abdominal:     General: Bowel sounds are normal. There is no distension.     Palpations: Abdomen is soft.     Tenderness: There is no abdominal tenderness. There is no guarding or rebound.  Genitourinary:    Comments: Loose stool present in the diaper.  Stage II decubitus  wounds over the coccyx and bilaterally at the gluteal fold.  No deep wounds present or tunneling.  No erythema, drainage or signs of infection Musculoskeletal:        General: No tenderness. Normal range of motion.     Right lower leg: No edema.     Left lower leg: No edema.     Comments: No edema  Skin:    General: Skin is warm and dry.     Findings: No rash.  Neurological:     Mental Status: She is alert and oriented to person, place, and time. Mental status is at baseline.     Cranial Nerves: No cranial nerve deficit.  Psychiatric:        Mood and Affect: Mood normal.        Behavior: Behavior normal.     ED Results / Procedures / Treatments   Labs (all labs ordered are listed, but only abnormal results are displayed) Labs Reviewed  COMPREHENSIVE METABOLIC PANEL WITH GFR - Abnormal; Notable for the following components:      Result Value   Glucose, Bld 142 (*)    BUN 29 (*)    Creatinine, Ser 1.21 (*)    GFR, Estimated 42 (*)    All other components within normal limits  CBC WITH DIFFERENTIAL/PLATELET - Abnormal; Notable for the following components:   Hemoglobin 10.0 (*)    HCT 32.5 (*)    MCH 25.6 (*)    All other components within normal limits  URINALYSIS, W/ REFLEX TO CULTURE (INFECTION SUSPECTED) - Abnormal; Notable for the following components:   Color, Urine STRAW (*)    APPearance HAZY (*)    Leukocytes,Ua LARGE (*)    Bacteria, UA FEW (*)    All other components within normal limits  SEDIMENTATION RATE - Abnormal; Notable for the following components:   Sed Rate 39 (*)    All other components within normal limits  URINE CULTURE  C-REACTIVE PROTEIN    EKG None  Radiology DG Pelvis 1-2 Views Result Date: 11/14/2023 CLINICAL DATA:  Decubitus wounds EXAM: PELVIS - 1 VIEW COMPARISON:  03/16/2019 FINDINGS: Multiple calcified uterine fibroids are seen. Bilateral hip replacement is again identified. Bony structures appear within normal limits. The sacrum of  somewhat obscured by the calcified fibroids. No other focal abnormality is seen. IMPRESSION: Chronic changes stable from the prior exam. Electronically Signed   By: Violeta Grey M.D.   On: 11/14/2023 19:47    Procedures Procedures  {Document cardiac monitor, telemetry assessment procedure when appropriate:1}  Medications Ordered in ED Medications  losartan  (COZAAR ) tablet 50 mg (50 mg Oral Given 11/14/23 2020)  acetaminophen  (TYLENOL ) tablet 1,000 mg (1,000 mg Oral Given 11/14/23 2020)  lactated ringers  bolus 250 mL (0 mLs Intravenous Stopped 11/14/23 2102)    ED Course/ Medical Decision Making/ A&P   {   Click here for ABCD2, HEART and other calculatorsREFRESH Note before signing :1}                              Medical Decision Making Amount and/or Complexity of Data Reviewed Labs: ordered.  Risk OTC drugs. Prescription drug management.   Pt with multiple medical problems and comorbidities and presenting today with a complaint that caries a high risk for morbidity and mortality.  Here today due to wounds in her buttocks and coccyx area.  Patient has stage II ulcers but no deep tunneling wounds or signs of infection today.  Some of the wounds appeared to almost be skin tears from where the diaper is rubbing her.  She does have some loose stool which her daughter states is new today but otherwise patient is well-appearing and there are no further complaints.  Patient is noted to be hypertensive here today but most likely because she has not had her losartan  for the last 1 week.  Patient's daughter reports that the respite care place will not take the patient back because they do not provide any wound care.  Will check labs and ensure there is no need for admission.  Will then consult transition of care team.  11:46 PM I independently interpreted patient's labs and UA does show large leukocytes and greater than 50 white cells but only few bacteria and patient was having loose stool in her  diaper.  She denies any urinary symptoms at this time.  Sed rate age-adjusted is normal, CRP is normal, CMP with stable lab values with creatinine without significant changes at 1.2, CBC with normal white count and unchanged hemoglobin.  I have independently visualized and interpreted pt's images today. Pelvic films without acute findings.  At this time urine culture was done but low suspicion for UTI.  After patient's losartan  blood pressure is significantly improved.  She has had no change in her baseline and has no complaints at this time.  Discussed with transition of care and they recommended that patient have a wound consult in the morning and they will reach back out to her facility and see where she needs to go for ongoing care.   {Document critical care time when appropriate:1} {Document review of labs and clinical decision tools ie heart score, Chads2Vasc2 etc:1}  {Document your independent review of radiology images, and any outside records:1} {Document your discussion with family members, caretakers, and with consultants:1} {Document social determinants of health affecting pt's care:1} {Document your decision making why or why not admission, treatments were needed:1} Final Clinical Impression(s) / ED Diagnoses Final diagnoses:  Pressure injury of buttock, stage 2, unspecified laterality (HCC)    Rx / DC Orders ED Discharge Orders     None

## 2023-11-15 ENCOUNTER — Telehealth: Payer: Self-pay | Admitting: Internal Medicine

## 2023-11-15 MED ORDER — ASPIRIN 81 MG PO TBEC
81.0000 mg | DELAYED_RELEASE_TABLET | Freq: Every day | ORAL | Status: DC
  Administered 2023-11-15: 81 mg via ORAL
  Filled 2023-11-15: qty 1

## 2023-11-15 MED ORDER — B COMPLEX-C PO TABS
1.0000 | ORAL_TABLET | Freq: Every day | ORAL | Status: DC
  Administered 2023-11-15: 1 via ORAL
  Filled 2023-11-15: qty 1

## 2023-11-15 MED ORDER — VITAMIN D 25 MCG (1000 UNIT) PO TABS
2000.0000 [IU] | ORAL_TABLET | Freq: Every day | ORAL | Status: DC
  Administered 2023-11-15: 2000 [IU] via ORAL
  Filled 2023-11-15: qty 2

## 2023-11-15 MED ORDER — HYDROCHLOROTHIAZIDE 12.5 MG PO TABS
12.5000 mg | ORAL_TABLET | Freq: Every day | ORAL | Status: DC
  Administered 2023-11-15: 12.5 mg via ORAL
  Filled 2023-11-15: qty 1

## 2023-11-15 MED ORDER — METFORMIN HCL 500 MG PO TABS
500.0000 mg | ORAL_TABLET | Freq: Every day | ORAL | Status: DC
  Administered 2023-11-15: 500 mg via ORAL
  Filled 2023-11-15: qty 1

## 2023-11-15 MED ORDER — FERROUS SULFATE 325 (65 FE) MG PO TABS
325.0000 mg | ORAL_TABLET | Freq: Every day | ORAL | Status: DC
  Administered 2023-11-15: 325 mg via ORAL
  Filled 2023-11-15: qty 1

## 2023-11-15 MED ORDER — LOSARTAN POTASSIUM 50 MG PO TABS
50.0000 mg | ORAL_TABLET | Freq: Every day | ORAL | Status: DC
  Administered 2023-11-15: 50 mg via ORAL
  Filled 2023-11-15: qty 1

## 2023-11-15 MED ORDER — METFORMIN HCL 500 MG PO TABS: 500.0000 mg | ORAL_TABLET | Freq: Every day | ORAL | Status: DC

## 2023-11-15 NOTE — TOC Initial Note (Addendum)
 Transition of Care [facility]) - Initial/Assessment Note    Patient Details  Name: Holly Banks MRN: [id number] Date of Birth: 1928/07/26  Transition of [facility]) CM/SW Contact:    Joanette Moynahan, RN Phone Number: 11/15/2023, 7:48 AM  Clinical Narrative:                 RNCM consulted regarding assisting pt with home health services to help care for stage 2 pressure injuries.         Patient Goals and CMS Choice  Return home with home health.          Expected Discharge Plan and Services    Pt previously active with [facility] for Home Health services RN as confirmed by [facility] with Randel Buss of [place].  Pt will resume HH services of RN. No DME needs identified at this time.                                          Prior Living Arrangements/Services  From [facility] (long term resident).                                Admission diagnosis:  Pressure Injury Back,Buttock, Hip; Digestive Issue Patient Active Problem List   Diagnosis Date Noted   Falls 04/07/2023   CKD (chronic kidney disease) stage 3, GFR 30-59 ml/min (HCC) 04/07/2023   Edema of right lower extremity 02/20/2023   Chronic anemia 02/20/2023   COVID-19 02/19/2023   FTT (failure to thrive) in adult 05/07/2019   Red eye 01/30/2019   Pain due to onychomycosis of toenails of both feet 12/26/2018   Diabetic neuropathy (HCC) 12/26/2018   Pain in toes of both feet 10/11/2018   Arthritis 07/12/2018   Localized swelling of right foot 05/26/2018   Change of skin color 05/26/2018   Gait disorder 10/05/2017   Decubitus ulcers 10/05/2017   Tremor 07/07/2017   Weakness 04/05/2017   Chills (without fever) 08/23/2016   Neck pain 05/24/2016   Ganglion cyst of flexor tendon sheath of finger 08/22/2015   Paresthesia 11/16/2013   Acute blood loss anemia 07/19/2013   Fracture of femur, right, closed (HCC) 07/11/2013   URI (upper respiratory infection) 06/15/2013   Cough 03/15/2013    Vision loss, right eye 01/05/2012   UTI (urinary tract infection) 03/31/2011   Rash 11/05/2010   SHOULDER PAIN 04/17/2009   TOBACCO USE, QUIT 04/17/2009   Gastritis and gastroduodenitis 12/25/2008   ABNORMAL EXAM-BILIARY TRACT 10/16/2008   GASTROINTESTINAL XRAY, ABNORMAL 10/16/2008   ANAL PRURITUS 09/17/2008   Edema 09/17/2008   OTHER DYSPHAGIA 09/17/2008   HAND PAIN 09/07/2007   Vitamin B 12 deficiency 07/05/2007   PARESTHESIA 07/05/2007   Tinnitus 03/23/2007   URINARY INCONTINENCE 03/21/2007   Non-insulin  dependent type 2 diabetes mellitus (HCC) 01/16/2007   Vitamin D  deficiency 01/16/2007   Gout 01/16/2007   HYPERKALEMIA 01/16/2007   OBESITY, MORBID 01/16/2007   Essential hypertension 01/16/2007   GERD 01/16/2007   Osteoarthritis 01/16/2007   SYMPTOM, INCONTINENCE, URINARY NEC 01/16/2007   History of colonic polyps 01/16/2007   PCP:  Genia Kettering, MD Pharmacy:   [facility] - [place], [place] - [address] [place] [address] Phone: [phone] Fax: [phone]  [id number] [place], [place] - [address] AT [facility] & [address] [place] [address] Phone: [phone] Fax: [phone]  [id number] [place], [place] - [address] [place] [address] Phone: [phone] Fax: [phone]     Social Drivers of Health (SDOH) Social History: SDOH Screenings   Food Insecurity: No Food Insecurity (02/20/2023)  Housing: Low Risk  (02/20/2023)  Transportation Needs: No Transportation Needs (02/20/2023)  Utilities: Not At Risk (02/20/2023)  Depression (PHQ2-9): Low Risk  (04/07/2023)  Tobacco Use: Medium Risk (11/14/2023)

## 2023-11-15 NOTE — ED Notes (Addendum)
 Pt xeroform dressing changed, pt changed into a clean gown and readjusted in the bed into a position of comfort, depend changed.

## 2023-11-15 NOTE — ED Notes (Signed)
 Pt given food tray. Pt stated that she wasn't [place] at this time.

## 2023-11-15 NOTE — Consult Note (Signed)
 WOC Nurse Consult Note: Reason for Consult: sacral/buttocks wounds  Wound type: Stage 2 Pressure Injuries sacrum/buttocks and B ischium  Pressure Injury POA: Yes Measurement: see nursing flowsheet; 4 small pink areas noted  Wound bed: pink  Drainage (amount, consistency, odor) see nursing flowsheet  Periwound: some pink healed areas  Dressing procedure/placement/frequency:  Cleanse sacrum/buttocks/ischium with soap and water, dry and cover any open areas with Xeroform gauze Timm Foot [phone]) and secure with silicone foam.   POC discussed with bedside nurse. WOC team will not follow. Re-consult if further needs arise.   Thank you,    Ronni Colace MSN, RN-BC, [employer] [phone]

## 2023-11-15 NOTE — Discharge Planning (Signed)
 Deadra Everts, RN from [facility] called regarding pt return. She can be reached at [phone] to receive report and notification of pt return to facility.    Connell Bognar J. Rachel Budds, BSN, RN, NCM  Transitions of Care  Nurse Case Manager  [facility] Emergency Departments  Operative Services  [phone]

## 2023-11-15 NOTE — Progress Notes (Addendum)
 CSW spoke with pt's daughter to explain that pt will return to [facility] with [facility] RN Gasper Karst) to assist with wound care. Daughter informed pt has been at [facility] since October because she is unable to provide care. Daughter expressed that pt does not ambulate and CSW informed that certain criteria must be met for rehab/wound care at [facility]. Daughter verbalized understanding.   RNCM spoke with RN at [facility] who stated pt can return. Report info provided to bedside RN. PTAR to transport - daughter stated she does not have a van.

## 2023-11-15 NOTE — Discharge Planning (Signed)
 RNCM placed call to pt daughter, Holly Banks regarding discharge plan. Holly Banks was told mom could be placed in SNF for wound care and that's what she was planning on. RNCM relayed information to bedside team.    [place]under [place] J. Rachel Budds, BSN, RN, NCM  Transitions of Care  Nurse Case Manager  [facility] Emergency Departments  Operative Services  [phone]

## 2023-11-15 NOTE — ED Provider Notes (Signed)
 Emergency Medicine Observation Re-evaluation Note  Holly Banks is a 89 y.o. female, seen on rounds today.  Pt initially presented to the ED for complaints of Wound Check Currently, the patient is asleep.  Physical Exam  BP 136/69   Pulse (!) 57   Temp 98.7 F (37.1 C)   Resp 16   SpO2 100%  Physical Exam General: asleep Cardiac: asleep Lungs: asleep Psych: asleep  ED Course / MDM  EKG:   I have reviewed the labs performed to date as well as medications administered while in observation.  Recent changes in the last 24 hours include home meds being ordered.  Plan  Current plan is for wound care recommendations and TOC placement.    Jerilynn Montenegro, MD 11/15/23 [phone]

## 2023-11-15 NOTE — Discharge Planning (Signed)
 RNCM placed call to [facility] regarding pt returning after discharge.  RN in a meeting.  Will return call when she can.  Baley Lorimer J. Rachel Budds, BSN, RN, NCM  Transitions of Care  Nurse Case Manager  [facility] Emergency Departments  Operative Services  [phone]

## 2023-11-15 NOTE — Telephone Encounter (Signed)
 Copied from CRM [id number]. Topic: General - Other >> Nov 15, 2023  8:27 AM Bambi Bonine D wrote: Reason for CRM: Pt's daughter [name] called and stated that the pt was taken to the hospital due to pressure wounds on the bottom. [name] stated that Dr.Plotnikov informed them that the pt would be admitted into the hospital but the hospital is stating that the pt was not admitted. [name] stated that the hospital informed her that they are unable to admit the pt because the pressure wounds are not big enough and the pt would have to be discharged from the hospital today.Pt was living at [facility] senior living but they stated they are unable to treat the wounds there and [name] stated that she is not able to care for the pt and needs advice on how to move forward. [name] is requesting to speak with Shelagh Derrick today if possible.

## 2023-11-16 NOTE — Telephone Encounter (Signed)
 Per ER SW note:    CSW spoke with pt's daughter to explain that pt will return to [facility] with [facility] RN Gasper Karst) to assist with wound care. Daughter informed pt has been at [facility] since October because she is unable to provide care. Daughter expressed that pt does not ambulate and CSW informed that certain criteria must be met for rehab/wound care at [facility]. Daughter verbalized understanding.    RNCM spoke with RN at [facility] who stated pt can return. Report info provided to bedside RN. PTAR to transport - daughter stated she does not have a van.

## 2023-11-16 NOTE — ED Notes (Signed)
 Pt left with PTAR on day shift, pt was never taken out of system by staff.

## 2023-11-17 LAB — URINE CULTURE: Culture: >=100000 — AB

## 2023-11-18 ENCOUNTER — Other Ambulatory Visit: Payer: Self-pay | Admitting: Internal Medicine

## 2023-11-18 MED ORDER — HYDROCHLOROTHIAZIDE 12.5 MG PO TABS: 12.5000 mg | ORAL_TABLET | Freq: Every day | ORAL | 3 refills | Status: AC

## 2023-11-18 MED ORDER — METFORMIN HCL 500 MG PO TABS: 500.0000 mg | ORAL_TABLET | Freq: Two times a day (BID) | ORAL | 3 refills | Status: AC

## 2023-11-18 NOTE — Telephone Encounter (Signed)
 Copied from CRM [id number]. Topic: Clinical - Medication Refill >> Nov 18, 2023 10:07 AM Alyse July wrote: Medication: metFORMIN  (GLUCOPHAGE ) 500 MG tablet hydrochlorothiazide  (HYDRODIURIL ) 12.5 MG tablet   Has the patient contacted their pharmacy? Yes  [id number] [place],  - [address] [place] [address] Phone: [phone] Fax: [phone]  Is this the correct pharmacy for this prescription? Yes If no, delete pharmacy and type the correct one.   Has the prescription been filled recently? No  Is the patient out of the medication? No  Has the patient been seen for an appointment in the last year OR does the patient have an upcoming appointment? Yes  Can we respond through MyChart? Yes  Agent: Please be advised that Rx refills may take up to 3 business days. We ask that you follow-up with your pharmacy.

## 2023-11-19 ENCOUNTER — Telehealth (HOSPITAL_BASED_OUTPATIENT_CLINIC_OR_DEPARTMENT_OTHER): Payer: Self-pay | Admitting: *Deleted

## 2023-11-19 NOTE — Telephone Encounter (Signed)
 Post ED Visit - Positive Culture Follow-up  Culture report reviewed by antimicrobial stewardship pharmacist: [facility] Team []  Court [place], Pharm.D. []  Skeet Duke, Pharm.D., BCPS AQ-ID []  Leslee Rase, Pharm.D., BCPS []  Garland Junk, Pharm.D., BCPS []  [place], [address].D., BCPS, AAHIVP []  Alcide Aly, Pharm.D., BCPS, AAHIVP []  Jerri Morale, PharmD, BCPS []  Graham Laws, PharmD, BCPS []  Cleda Curly, PharmD, BCPS []  Tamar Fairly, PharmD []  Ballard Levels, PharmD, BCPS []  Ollen Beverage, PharmD  [facility]acy Team []  Arlyne Bering, PharmD []  Sherryle Don, PharmD []  Van Gelinas, PharmD []  Delila Felty, Rph []  [place]) Cleora Daft, PharmD [x]  Augustina Block, PharmD []  Arie Kurtz, PharmD []  Sharlyn Deaner, PharmD []  Agnes Hose, PharmD []  Kendall Pauls, PharmD []  Gladstone Lamer, PharmD []  Armanda Bern, PharmD []  Tera Fellows, PharmD   Positive urine culture Asymptomatic bacteruria, no antibiotics necessary at this time  Georgine Kitchens 11/19/2023, 12:07 PM

## 2024-02-08 ENCOUNTER — Ambulatory Visit: Admitting: Internal Medicine

## 2024-02-08 ENCOUNTER — Encounter: Payer: Self-pay | Admitting: Internal Medicine

## 2024-02-08 ENCOUNTER — Ambulatory Visit

## 2024-02-08 ENCOUNTER — Ambulatory Visit (INDEPENDENT_AMBULATORY_CARE_PROVIDER_SITE_OTHER): Admitting: Internal Medicine

## 2024-02-08 ENCOUNTER — Other Ambulatory Visit: Payer: Self-pay | Admitting: Internal Medicine

## 2024-02-08 ENCOUNTER — Ambulatory Visit: Payer: Self-pay

## 2024-02-08 VITALS — BP 140/78 | HR 83 | Temp 97.9°F | Ht 64.0 in | Wt 160.0 lb

## 2024-02-08 VITALS — BP 140/78 | HR 83 | Ht 64.0 in | Wt 160.0 lb

## 2024-02-08 DIAGNOSIS — E559 Vitamin D deficiency, unspecified: Secondary | ICD-10-CM | POA: Diagnosis not present

## 2024-02-08 DIAGNOSIS — E538 Deficiency of other specified B group vitamins: Secondary | ICD-10-CM | POA: Diagnosis not present

## 2024-02-08 DIAGNOSIS — E119 Type 2 diabetes mellitus without complications: Secondary | ICD-10-CM

## 2024-02-08 DIAGNOSIS — N183 Chronic kidney disease, stage 3 unspecified: Secondary | ICD-10-CM | POA: Diagnosis not present

## 2024-02-08 DIAGNOSIS — E673 Hypervitaminosis D: Secondary | ICD-10-CM | POA: Insufficient documentation

## 2024-02-08 DIAGNOSIS — M79671 Pain in right foot: Secondary | ICD-10-CM

## 2024-02-08 DIAGNOSIS — N1832 Chronic kidney disease, stage 3b: Secondary | ICD-10-CM

## 2024-02-08 DIAGNOSIS — Z0000 Encounter for general adult medical examination without abnormal findings: Secondary | ICD-10-CM | POA: Diagnosis not present

## 2024-02-08 DIAGNOSIS — G8929 Other chronic pain: Secondary | ICD-10-CM

## 2024-02-08 DIAGNOSIS — M25511 Pain in right shoulder: Secondary | ICD-10-CM

## 2024-02-08 DIAGNOSIS — E1122 Type 2 diabetes mellitus with diabetic chronic kidney disease: Secondary | ICD-10-CM

## 2024-02-08 DIAGNOSIS — M79672 Pain in left foot: Secondary | ICD-10-CM | POA: Diagnosis not present

## 2024-02-08 LAB — BASIC METABOLIC PANEL WITH GFR
BUN: 33 mg/dL — ABNORMAL HIGH (ref 6–23)
CO2: 26 meq/L (ref 19–32)
Calcium: 9.2 mg/dL (ref 8.4–10.5)
Chloride: 106 meq/L (ref 96–112)
Creatinine, Ser: 1.29 mg/dL — ABNORMAL HIGH (ref 0.40–1.20)
GFR: 35.77 mL/min — ABNORMAL LOW (ref >60.00)
Glucose, Bld: 170 mg/dL — ABNORMAL HIGH (ref 70–99)
Potassium: 3.9 meq/L (ref 3.5–5.1)
Sodium: 140 meq/L (ref 135–145)

## 2024-02-08 LAB — CBC WITH DIFFERENTIAL/PLATELET
Basophils Absolute: 0 K/uL (ref 0.0–0.1)
Basophils Relative: 0.5 % (ref 0.0–3.0)
Eosinophils Absolute: 0.1 K/uL (ref 0.0–0.7)
Eosinophils Relative: 2.4 % (ref 0.0–5.0)
HCT: 31.3 % — ABNORMAL LOW (ref 36.0–46.0)
Hemoglobin: 9.8 g/dL — ABNORMAL LOW (ref 12.0–15.0)
Lymphocytes Relative: 34.2 % (ref 12.0–46.0)
Lymphs Abs: 2 K/uL (ref 0.7–4.0)
MCHC: 31.2 g/dL (ref 30.0–36.0)
MCV: 82 fl (ref 78.0–100.0)
Monocytes Absolute: 0.5 K/uL (ref 0.1–1.0)
Monocytes Relative: 8.6 % (ref 3.0–12.0)
Neutro Abs: 3.1 K/uL (ref 1.4–7.7)
Neutrophils Relative %: 54.3 % (ref 43.0–77.0)
Platelets: 223 K/uL (ref 150.0–400.0)
RBC: 3.81 Mil/uL — ABNORMAL LOW (ref 3.87–5.11)
RDW: 13.4 % (ref 11.5–15.5)
WBC: 5.7 K/uL (ref 4.0–10.5)

## 2024-02-08 LAB — COMPREHENSIVE METABOLIC PANEL WITH GFR
ALT: 7 U/L (ref 0–35)
AST: 14 U/L (ref 0–37)
Albumin: 3.4 g/dL — ABNORMAL LOW (ref 3.5–5.2)
Alkaline Phosphatase: 51 U/L (ref 39–117)
BUN: 33 mg/dL — ABNORMAL HIGH (ref 6–23)
CO2: 26 meq/L (ref 19–32)
Calcium: 9.2 mg/dL (ref 8.4–10.5)
Chloride: 106 meq/L (ref 96–112)
Creatinine, Ser: 1.29 mg/dL — ABNORMAL HIGH (ref 0.40–1.20)
GFR: 35.77 mL/min — ABNORMAL LOW (ref >60.00)
Glucose, Bld: 170 mg/dL — ABNORMAL HIGH (ref 70–99)
Potassium: 3.9 meq/L (ref 3.5–5.1)
Sodium: 140 meq/L (ref 135–145)
Total Bilirubin: 0.3 mg/dL (ref 0.2–1.2)
Total Protein: 6.8 g/dL (ref 6.0–8.3)

## 2024-02-08 LAB — TSH: TSH: 2.77 u[IU]/mL (ref 0.35–5.50)

## 2024-02-08 LAB — HEMOGLOBIN A1C: Hgb A1c MFr Bld: 7.2 % — ABNORMAL HIGH (ref 4.6–6.5)

## 2024-02-08 LAB — VITAMIN B12: Vitamin B-12: 364 pg/mL (ref 211–911)

## 2024-02-08 LAB — VITAMIN D 25 HYDROXY (VIT D DEFICIENCY, FRACTURES): VITD: 108.55 ng/mL (ref 30.00–100.00)

## 2024-02-08 NOTE — Assessment & Plan Note (Signed)
ROM exercises 

## 2024-02-08 NOTE — Telephone Encounter (Signed)
 Spoke with patient and patient's daughter, Dalton. Per Dr. Lula recommendations: Stop taking all vitamin D .Need to follow up with Dr. Garald, to have levels rechecked.   Patient and pt's daughter verbalized understanding.

## 2024-02-08 NOTE — Assessment & Plan Note (Signed)
 ADVISED TO STOP ALL VITAMIN D  SUPPLEMENTS DUE TO VIT D LEVEL > 100.  SHOULD RECHECK IN 3 MONTHS

## 2024-02-08 NOTE — Assessment & Plan Note (Signed)
Continue with vitamin B12.  Obtain vitamin B12 level

## 2024-02-08 NOTE — Telephone Encounter (Signed)
 This RN received results for a critical high Vitamin D  108.55.

## 2024-02-08 NOTE — Assessment & Plan Note (Signed)
Continue on vitamin D.  Obtain vitamin D level

## 2024-02-08 NOTE — Patient Instructions (Addendum)
 Ms. Holly Banks , Thank you for taking time out of your busy schedule to complete your Annual Wellness Visit with me. I enjoyed our conversation and look forward to speaking with you again next year. I, as well as your care team,  appreciate your ongoing commitment to your health goals. Please review the following plan we discussed and let me know if I can assist you in the future. Your Game plan/ To Do List    Referrals: If you haven't heard from the office you've been referred to, please reach out to them at the phone provided.   Follow up Visits: We will see or speak with you next year for your Next Medicare AWV with our clinical staff Have you seen your provider in the last 6 months (3 months if uncontrolled diabetes)? No  Clinician Recommendations:  Aim for 30 minutes of exercise or brisk walking, 6-8 glasses of water, and 5 servings of fruits and vegetables each day. Educated and advised on getting the Tdap vaccine in 2025.      This is a list of the screenings recommended for you:  Health Maintenance  Topic Date Due   Eye exam for diabetics  11/22/2019   DTaP/Tdap/Td vaccine (2 - Tdap) 04/03/2020   Complete foot exam   08/07/2020   Hemoglobin A1C  10/05/2023   Flu Shot  01/06/2024   COVID-19 Vaccine (1 - 2024-25 season) Never done   Medicare Annual Wellness Visit  02/07/2025   Pneumococcal Vaccine for age over 41  Completed   HPV Vaccine  Aged Out   Meningitis B Vaccine  Aged Out   Zoster (Shingles) Vaccine  Discontinued    Advanced directives: (In Chart) A copy of your advanced directives are scanned into your chart should your provider ever need it. Advance Care Planning is important because it:  [x]  Makes sure you receive the medical care that is consistent with your values, goals, and preferences  [x]  It provides guidance to your family and loved ones and reduces their decisional burden about whether or not they are making the right decisions based on your wishes.  Follow  the link provided in your after visit summary or read over the paperwork we have mailed to you to help you started getting your Advance Directives in place. If you need assistance in completing these, please reach out to us  so that we can help you!

## 2024-02-08 NOTE — Assessment & Plan Note (Signed)
Hydrate well ?Monitor GFR ?

## 2024-02-08 NOTE — Telephone Encounter (Signed)
 This RN contact on call MD, Verneita Kettering. Informed MD of critical high vitamin D  level 108.55.  Dr. Lula recommendations: Call patient and have her to stop taking all vitamin D . She will need to follow up with Dr. Garald, to have levels rechecked. MD states will send Dr. Garald a message.

## 2024-02-08 NOTE — Progress Notes (Signed)
 Subjective:  Patient ID: Holly Banks, female    DOB: 10/01/1928  Age: 89 y.o. MRN: [id number]  CC: Follow-up (No concerns)   HPI Paislea Hatton presents for pressure sores - healed F/u on DM, OA Here w/dtr Naomie  Outpatient Medications Prior to Visit  Medication Sig Dispense Refill   aspirin  EC 81 MG tablet Take 81 mg by mouth daily. Swallow whole.     b complex vitamins capsule Take 1 capsule by mouth daily. 100 capsule 3   Cholecalciferol  (VITAMIN D ) 50 MCG (2000 UT) CAPS Take 2,000 Units by mouth daily.     ferrous sulfate  325 (65 FE) MG tablet Take 1 tablet (325 mg total) by mouth daily. 90 tablet 1   hydrochlorothiazide  (HYDRODIURIL ) 12.5 MG tablet Take 1 tablet (12.5 mg total) by mouth daily. 90 tablet 3   metFORMIN  (GLUCOPHAGE ) 500 MG tablet Take 1 tablet (500 mg total) by mouth 2 (two) times daily with a meal. Must keep scheduled appt for future refills 180 tablet 3   losartan  (COZAAR ) 50 MG tablet TAKE 1 TABLET EVERY DAY  FOR  HYPERTENSION (Patient not taking: Reported on 02/08/2024) 90 tablet 3   No facility-administered medications prior to visit.    ROS: Review of Systems  Constitutional:  Positive for fatigue. Negative for activity change, appetite change, chills and unexpected weight change.  HENT:  Negative for congestion, mouth sores and sinus pressure.   Eyes:  Negative for visual disturbance.  Respiratory:  Negative for cough and chest tightness.   Gastrointestinal:  Negative for abdominal pain and nausea.  Genitourinary:  Negative for difficulty urinating, frequency and vaginal pain.  Musculoskeletal:  Positive for arthralgias, back pain and gait problem.  Skin:  Negative for pallor and rash.  Neurological:  Negative for dizziness, tremors, weakness, numbness and headaches.  Psychiatric/Behavioral:  Positive for decreased concentration. Negative for confusion, sleep disturbance and suicidal ideas. The patient is not nervous/anxious.     Objective:   BP (!) 140/78   Pulse 83   Temp 97.9 F (36.6 C)   Ht 5' 4 (1.626 m)   Wt 160 lb (72.6 kg)   SpO2 98%   BMI 27.46 kg/m   BP Readings from Last 3 Encounters:  02/08/24 (!) 140/78  02/08/24 (!) 140/78  11/15/23 (!) 157/69    Wt Readings from Last 3 Encounters:  02/08/24 160 lb (72.6 kg)  02/08/24 160 lb (72.6 kg)  02/20/23 160 lb 7.9 oz (72.8 kg)    Physical Exam Constitutional:      General: She is not in acute distress.    Appearance: She is well-developed. She is obese.  HENT:     Head: Normocephalic.     Right Ear: External ear normal.     Left Ear: External ear normal.     Nose: Nose normal.  Eyes:     General:        Right eye: No discharge.        Left eye: No discharge.     Conjunctiva/sclera: Conjunctivae normal.     Pupils: Pupils are equal, round, and reactive to light.  Neck:     Thyroid : No thyromegaly.     Vascular: No JVD.     Trachea: No tracheal deviation.  Cardiovascular:     Rate and Rhythm: Normal rate and regular rhythm.     Heart sounds: Normal heart sounds.  Pulmonary:     Effort: No respiratory distress.     Breath sounds: No stridor. No  wheezing.  Chest:     Chest wall: No tenderness.  Abdominal:     General: Bowel sounds are normal. There is no distension.     Palpations: Abdomen is soft. There is no mass.     Tenderness: There is no abdominal tenderness. There is no guarding or rebound.  Musculoskeletal:        General: No tenderness.     Cervical back: Normal range of motion and neck supple. No rigidity.     Right lower leg: Edema present.     Left lower leg: Edema present.  Lymphadenopathy:     Cervical: No cervical adenopathy.  Skin:    Findings: No erythema or rash.  Neurological:     Mental Status: Mental status is at baseline.     Cranial Nerves: No cranial nerve deficit.     Motor: No abnormal muscle tone.     Coordination: Coordination abnormal.     Gait: Gait abnormal.     Deep Tendon Reflexes: Reflexes normal.   Psychiatric:        Behavior: Behavior normal.        Thought Content: Thought content normal.        Judgment: Judgment normal.   In a w/c B feet are w/swelling R>L [place] w/pain  Lab Results  Component Value Date   WBC 7.0 11/14/2023   HGB 10.0 (L) 11/14/2023   HCT 32.5 (L) 11/14/2023   PLT 214 11/14/2023   GLUCOSE 142 (H) 11/14/2023   CHOL 198 05/04/2011   TRIG 116.0 05/04/2011   HDL 62.30 05/04/2011   LDLDIRECT 126.1 03/31/2010   LDLCALC 113 (H) 05/04/2011   ALT 14 11/14/2023   AST 25 11/14/2023   NA 139 11/14/2023   K 4.7 11/14/2023   CL 103 11/14/2023   CREATININE 1.21 (H) 11/14/2023   BUN 29 (H) 11/14/2023   CO2 27 11/14/2023   TSH 4.53 04/07/2023   INR 2.2 (H) 10/25/2007   HGBA1C 5.6 04/07/2023   MICROALBUR 26.2 (H) 06/30/2010    DG Pelvis 1-2 Views Result Date: 11/14/2023 CLINICAL DATA:  Decubitus wounds EXAM: PELVIS - 1 VIEW COMPARISON:  03/16/2019 FINDINGS: Multiple calcified uterine fibroids are seen. Bilateral hip replacement is again identified. Bony structures appear within normal limits. The sacrum of somewhat obscured by the calcified fibroids. No other focal abnormality is seen. IMPRESSION: Chronic changes stable from the prior exam. Electronically Signed   By: Oneil Devonshire M.D.   On: 11/14/2023 19:47    Assessment & Plan:   Problem List Items Addressed This Visit     CKD (chronic kidney disease) stage 3, GFR 30-59 ml/min (HCC)   Hydrate well.  Monitor GFR      Relevant Orders   CBC with Differential/Platelet   Comprehensive metabolic panel with GFR   Hemoglobin A1c   TSH   Vitamin B12   VITAMIN D  25 Hydroxy (Vit-D Deficiency, Fractures)   HAND PAIN   Relevant Orders   Ambulatory referral to Podiatry   Non-insulin  dependent type 2 diabetes mellitus (HCC) - Primary   Obtain hemoglobin A1c      Relevant Orders   CBC with Differential/Platelet   Comprehensive metabolic panel with GFR   Hemoglobin A1c   TSH   Vitamin B12   VITAMIN D   25 Hydroxy (Vit-D Deficiency, Fractures)   Shoulder pain   ROM exercises      Vitamin B 12 deficiency   Continue with vitamin B12.  Obtain vitamin B12 level  Relevant Orders   Vitamin B12   Vitamin D  deficiency   Continue on vitamin D .  Obtain vitamin D  level      Relevant Orders   VITAMIN D  25 Hydroxy (Vit-D Deficiency, Fractures)      No orders of the defined types were placed in this encounter.     Follow-up: Return in about 3 months (around 05/09/2024) for a follow-up visit.  Marolyn Noel, MD

## 2024-02-08 NOTE — Progress Notes (Addendum)
 Subjective:   Holly Banks is a 89 y.o. who presents for a Medicare Wellness preventive visit.  As a reminder, Annual Wellness Visits don't include a physical exam, and some assessments may be limited, especially if this visit is performed virtually. We may recommend an in-person follow-up visit with your provider if needed.  Visit Complete: In person  Persons Participating in Visit: Patient.  AWV Questionnaire: No: Patient Medicare AWV questionnaire was not completed prior to this visit.  Cardiac Risk Factors include: advanced age (>53men, >67 women);diabetes mellitus;hypertension     Objective:    Today's Vitals   02/08/24 1406  BP: (!) 140/78  Pulse: 83  SpO2: 98%  Weight: 160 lb (72.6 kg)  Height: 5' 4 (1.626 m)   Body mass index is 27.46 kg/m.     02/08/2024    2:06 PM 11/14/2023    4:34 PM 02/20/2023    2:33 AM 01/06/2017   11:46 AM 07/11/2013   11:00 PM 07/11/2013    2:50 PM  Advanced Directives  Does Patient Have a Medical Advance Directive? Yes Yes No No  Patient does not have advance directive;Patient would not like information  Patient does not have advance directive;Patient would not like information   Type of Public librarian Power of [place];Living will Healthcare Power of [place];Living will      Does patient want to make changes to medical advance directive? No - Patient declined       Copy of Healthcare Power of Attorney in Chart? Yes - validated most recent copy scanned in chart (See row information)       Would patient like information on creating a medical advance directive?   No - Patient declined Yes (ED - Information included in AVS)     Pre-existing out of facility DNR order (yellow form or pink MOST form)      No      Data saved with a previous flowsheet row definition    Current Medications (verified) Outpatient Encounter Medications as of 02/08/2024  Medication Sig   aspirin  EC 81 MG tablet Take 81 mg by mouth daily. Swallow whole.    b complex vitamins capsule Take 1 capsule by mouth daily.   Cholecalciferol  (VITAMIN D ) 50 MCG (2000 UT) CAPS Take 2,000 Units by mouth daily.   ferrous sulfate  325 (65 FE) MG tablet Take 1 tablet (325 mg total) by mouth daily.   hydrochlorothiazide  (HYDRODIURIL ) 12.5 MG tablet Take 1 tablet (12.5 mg total) by mouth daily.   metFORMIN  (GLUCOPHAGE ) 500 MG tablet Take 1 tablet (500 mg total) by mouth 2 (two) times daily with a meal. Must keep scheduled appt for future refills   losartan  (COZAAR ) 50 MG tablet TAKE 1 TABLET EVERY DAY  FOR  HYPERTENSION (Patient not taking: Reported on 02/08/2024)   No facility-administered encounter medications on file as of 02/08/2024.    Allergies (verified) Ditropan [oxybutynin] and Penicillins   History: Past Medical History:  Diagnosis Date   Diabetes mellitus    type II   Diverticulosis    Gastritis    w/ hx of h. pylori   GERD (gastroesophageal reflux disease)    Gout    Hyperkalemia    Hypertension    Leiomyoma 2-02   colonic polypoid    Morbid obesity (HCC)    Osteoarthritis    Pain in joint, lower leg    Unspecified vitamin D  deficiency    Urinary incontinence    Past Surgical History:  Procedure Laterality Date  CHOLECYSTECTOMY     LITHOTRIPSY     ORIF FEMUR FRACTURE N/A 07/12/2013   Procedure: OPEN REDUCTION INTERNAL FIXATION (ORIF) DISTAL FEMUR FRACTURE;  Surgeon: Maude KANDICE Herald, MD;  Location: [facility] OR;  Service: Orthopedics;  Laterality: N/A;   right tkr     thr left     thr left 3/4     TOTAL HIP ARTHROPLASTY  2009   right   TOTAL HIP ARTHROPLASTY     left   Family History  Problem Relation Age of Onset   Cancer Sister        breast, stomach   Diabetes Sister    Diabetes Other    Colon cancer Neg Hx    Stomach cancer Neg Hx    Esophageal cancer Neg Hx    Social History   Socioeconomic History   Marital status: Widowed    Spouse name: Not on file   Number of children: Not on file   Years of education: Not on file    Highest education level: Not on file  Occupational History   Not on file  Tobacco Use   Smoking status: Former    Current packs/day: 0.00    Types: Cigarettes    Quit date: 06/07/1960    Years since quitting: 63.7   Smokeless tobacco: Never  Substance and Sexual Activity   Alcohol use: No   Drug use: No   Sexual activity: Never  Other Topics Concern   Not on file  Social History Narrative   Not on file   Social Drivers of Health   Financial Resource Strain: Low Risk  (02/08/2024)   Overall Financial Resource Strain (CARDIA)    Difficulty of Paying Living Expenses: Not hard at all  Food Insecurity: No Food Insecurity (02/08/2024)   Hunger Vital Sign    Worried About Running Out of Food in the Last Year: Never true    Ran Out of Food in the Last Year: Never true  Transportation Needs: No Transportation Needs (02/08/2024)   PRAPARE - Administrator, Civil Service (Medical): No    Lack of Transportation (Non-Medical): No  Physical Activity: Inactive (02/08/2024)   Exercise Vital Sign    Days of Exercise per Week: 0 days    Minutes of Exercise per Session: 0 min  Stress: No Stress Concern Present (02/08/2024)   [employer] of Occupational Health - Occupational Stress Questionnaire    Feeling of Stress: Not at all  Social Connections: Socially Isolated (02/08/2024)   Social Connection and Isolation Panel    Frequency of Communication with Friends and Family: More than three times a week    Frequency of Social Gatherings with Friends and Family: Once a week    Attends Religious Services: Never    Database administrator or Organizations: No    Attends Banker Meetings: Never    Marital Status: Widowed    Tobacco Counseling Counseling given: Not Answered    Clinical Intake:  Pre-visit preparation completed: Yes  Pain : No/denies pain     BMI - recorded: 27.46 Nutritional Status: BMI 25 -29 Overweight Nutritional Risks: None Diabetes:  No  Lab Results  Component Value Date   HGBA1C 5.6 04/07/2023   HGBA1C 5.6 02/20/2023   HGBA1C 6.7 (H) 05/07/2019     Banks often do you need to have someone help you when you read instructions, pamphlets, or other written materials from your doctor or pharmacy?: 5 - Always (Nurse/Dtr)  Interpreter Needed?:  No  Comments: [facility] [place], [place] Information entered by :: Verdie Saba, CMA   Activities of Daily Living     02/08/2024    2:51 PM 02/20/2023    2:41 AM  In your present state of health, do you have any difficulty performing the following activities:  Hearing? 0   Vision? 0   Difficulty concentrating or making decisions? 0   Walking or climbing stairs? 1   Comment uses a wheelchair   Dressing or bathing? 1   Comment CNA assists   Doing errands, shopping? 1 1  Comment resides at [employer] and eating ? N   Using the Toilet? Y   Comment CNA assists   In the past six months, have you accidently leaked urine? Y   Comment wears a depend   Do you have problems with loss of bowel control? Y   Comment wears a depend   Managing your Medications? Y   Comment RN at [employer]? Y   Comment Dtr assists   Housekeeping or managing your Housekeeping? Y   Comment resides at [facility]     Patient Care Team: Plotnikov, Karlynn GAILS, MD as PCP - General I have updated your Care Teams any recent Medical Services you may have received from other providers in the past year.     Assessment:   This is a routine wellness examination for [facility].  Hearing/Vision screen Hearing Screening - Comments:: Denies hearing difficulties   Vision Screening - Comments:: Daughter will schedule an appt w/[facility] for a Diabetic eye exam.    Goals Addressed               This Visit's Progress     Patient Stated (pt-stated)        Patient stated she has no interest in exercising       Depression  Screen     02/08/2024    2:15 PM 04/07/2023    1:42 PM 01/06/2017   11:48 AM 11/28/2015    8:12 AM 11/08/2014    9:56 AM  PHQ 2/9 Scores  PHQ - 2 Score 0 0 2 0 0  PHQ- 9 Score 3  8      Fall Risk     02/08/2024    2:14 PM 04/07/2023    1:42 PM 04/06/2018    1:46 PM 01/06/2017   11:48 AM 11/28/2015    8:12 AM  Fall Risk   Falls in the past year? 0 1 Yes  Yes  No   Number falls in past yr: 0 1 1  2  or more    Injury with Fall? 0 1 No     Risk for fall due to : Impaired balance/gait;History of fall(s);No Fall Risks Impaired balance/gait;Impaired mobility  Impaired balance/gait;Impaired mobility;Impaired vision    Follow up Falls evaluation completed;Falls prevention discussed Falls evaluation completed Falls evaluation completed  Falls prevention discussed;Education provided       Data saved with a previous flowsheet row definition    MEDICARE RISK AT HOME:  Medicare Risk at Home Any stairs in or around the home?: No If so, are there any without handrails?: No Home free of loose throw rugs in walkways, pet beds, electrical cords, etc?: Yes Adequate lighting in your home to reduce risk of falls?: Yes Life alert?: Yes Use of a cane, walker or w/c?: Yes (wheelchair) Grab bars in the bathroom?: Yes Shower chair  or bench in shower?: Yes Elevated toilet seat or a handicapped toilet?: Yes  TIMED UP AND GO:  Was the test performed?  No  Cognitive Function: Declined/Normal: No cognitive concerns noted by patient or family. Patient alert, oriented, able to answer questions appropriately and recall recent events. No signs of memory loss or confusion.    02/08/2024    2:34 PM 01/06/2017   11:50 AM  MMSE - Mini Mental State Exam  Not completed: Unable to complete   Orientation to time  5   Orientation to Place  5   Registration  3   Attention/ Calculation  4   Recall  1   Language- name 2 objects  2   Language- repeat  1  Language- follow 3 step command  3   Language- read & follow  direction  1   Write a sentence  1   Copy design  1   Total score  27      Data saved with a previous flowsheet row definition        Immunizations Immunization History  Administered Date(s) Administered   Fluad Quad(high Dose 65+) 01/30/2019, 03/17/2019   INFLUENZA, HIGH DOSE SEASONAL PF 02/27/2016, 04/05/2017, 04/06/2018   Influenza Split 04/10/2012   Influenza Whole 04/06/2006, 03/05/2008, 04/17/2009, 04/03/2010   Influenza,inj,Quad PF,6+ Mos 03/15/2013, 02/22/2014, 02/14/2015   PPD Test 07/17/2013   Pneumococcal Conjugate-13 03/15/2013   Pneumococcal Polysaccharide-23 06/08/2004   Td 04/03/2010    Screening Tests Health Maintenance  Topic Date Due   OPHTHALMOLOGY EXAM  11/22/2019   DTaP/Tdap/Td (2 - Tdap) 04/03/2020   FOOT EXAM  08/07/2020   HEMOGLOBIN A1C  10/05/2023   INFLUENZA VACCINE  01/06/2024   COVID-19 Vaccine (1 - 2024-25 season) Never done   Medicare Annual Wellness (AWV)  02/07/2025   Pneumococcal Vaccine: 50+ Years  Completed   HPV VACCINES  Aged Out   Meningococcal B Vaccine  Aged Out   Zoster Vaccines- Shingrix  Discontinued    Health Maintenance  Health Maintenance Due  Topic Date Due   OPHTHALMOLOGY EXAM  11/22/2019   DTaP/Tdap/Td (2 - Tdap) 04/03/2020   FOOT EXAM  08/07/2020   HEMOGLOBIN A1C  10/05/2023   INFLUENZA VACCINE  01/06/2024   COVID-19 Vaccine (1 - 2024-25 season) Never done   Health Maintenance Items Addressed:  Diabetic Foot Exam recommended, Labs Ordered: Hemoglobin A1C level  Referral to [facility] - Dr Cordella Bold for a Diabetes foot exam  Additional Screening:  Vision Screening: Recommended annual ophthalmology exams for early detection of glaucoma and other disorders of the eye. Would you like a referral to an eye doctor? No  Daughter will schedule an appt w/[facility] for a Diabetic eye exam.  Dental Screening: Recommended annual dental exams for proper oral hygiene  Community Resource Referral /  Chronic Care Management: CRR required this visit?  No   CCM required this visit?  No   Plan:    I have personally reviewed and noted the following in the patient's chart:   Medical and social history Use of alcohol, tobacco or illicit drugs  Current medications and supplements including opioid prescriptions. Patient is not currently taking opioid prescriptions. Functional ability and status Nutritional status Physical activity Advanced directives List of other physicians Hospitalizations, surgeries, and ER visits in previous 12 months Vitals Screenings to include cognitive, depression, and falls Referrals and appointments  In addition, I have reviewed and discussed with patient certain preventive protocols, quality metrics, and best  practice recommendations. A written personalized care plan for preventive services as well as general preventive health recommendations were provided to patient.   Verdie CHRISTELLA Saba, CMA   02/08/2024   After Visit Summary: (In Person-Declined) Patient declined AVS at this time.  Notes: Please refer to Routing Comments.  Referral to [facility] - Dr Cordella Bold for a Diabetes foot exam.  Daughter will schedule an appt w/[facility] for a Diabetic eye exam.  Medical screening examination/treatment/procedure(s) were performed by non-physician practitioner and as supervising physician I was immediately available for consultation/collaboration.  I agree with above. Karlynn Noel, MD

## 2024-02-08 NOTE — Assessment & Plan Note (Signed)
Obtain hemoglobin A1c 

## 2024-02-09 ENCOUNTER — Telehealth: Payer: Self-pay

## 2024-02-09 NOTE — Telephone Encounter (Signed)
 Copied from CRM [id number]. Topic: Clinical - Prescription Issue >> Feb 09, 2024 10:12 AM Farrel B wrote: Reason for CRM: Ms. Ramonita, the patients daughter called stating that the vitamin D  needs to be discontinued due to the labs. The assisted living facility needs a fax from provider stating the vitamin D  intake needs to be discontinued. Please call patient's daughter if you have any questions. >> Feb 09, 2024 11:10 AM Graeme ORN wrote: Called back to provide fax number to Senior living facility Regarding labs - Vitamin D  levels. Fax number: [phone]

## 2024-02-09 NOTE — Telephone Encounter (Signed)
 Copied from CRM [id number]. Topic: Clinical - Prescription Issue >> Feb 09, 2024 10:12 AM Farrel B wrote: Reason for CRM: Ms. Holly Banks, the patients daughter called stating that the vitamin D  needs to be discontinued due to the labs. The assisted living facility needs a fax from provider stating the vitamin D  intake needs to be discontinued. Please call patient's daughter if you have any questions.

## 2024-02-10 ENCOUNTER — Encounter: Payer: Self-pay | Admitting: Internal Medicine

## 2024-02-10 NOTE — Telephone Encounter (Signed)
LVM for patients daughter to return call

## 2024-02-10 NOTE — Telephone Encounter (Signed)
 Yes, please. Thank you.

## 2024-02-10 NOTE — Telephone Encounter (Signed)
 Letter printed & faxed to phone number in separate encounter

## 2024-02-10 NOTE — Telephone Encounter (Signed)
 Copied from CRM [id number]. Topic: General - Other >> Feb 10, 2024  3:33 PM Thersia C wrote: Reason for CRM: Patient called in regarding a missed call from [place], patient would like a callback

## 2024-02-14 ENCOUNTER — Ambulatory Visit: Payer: Self-pay | Admitting: Internal Medicine

## 2024-02-19 NOTE — Telephone Encounter (Signed)
 Vitamin D  was discontinued.  Thanks

## 2024-03-12 ENCOUNTER — Encounter: Payer: Self-pay | Admitting: Podiatry

## 2024-03-12 ENCOUNTER — Ambulatory Visit: Admitting: Podiatry

## 2024-03-12 DIAGNOSIS — M79674 Pain in right toe(s): Secondary | ICD-10-CM | POA: Diagnosis not present

## 2024-03-12 DIAGNOSIS — E1142 Type 2 diabetes mellitus with diabetic polyneuropathy: Secondary | ICD-10-CM | POA: Diagnosis not present

## 2024-03-12 DIAGNOSIS — M79675 Pain in left toe(s): Secondary | ICD-10-CM | POA: Diagnosis not present

## 2024-03-12 DIAGNOSIS — B351 Tinea unguium: Secondary | ICD-10-CM

## 2024-03-12 NOTE — Addendum Note (Signed)
 Addended by: LOREDA HACKER on: 03/12/2024 04:53 PM   Modules accepted: Level of Service

## 2024-03-12 NOTE — Progress Notes (Signed)
This patient returns to my office for at risk foot care.  This patient requires this care by a professional since this patient will be at risk due to having diabetes and CKD.  This patient is unable to cut nails herself since the patient cannot reach her nails.These nails are painful walking and wearing shoes.  This patient presents for at risk foot care today.  General Appearance  Alert, conversant and in no acute stress.  Vascular  Dorsalis pedis and posterior tibial  pulses are  weakly palpable  bilaterally.  Capillary return is within normal limits  bilaterally. Temperature is within normal limits  bilaterally.  Neurologic  Senn-Weinstein monofilament wire test within normal limits  bilaterally. Muscle power within normal limits bilaterally.  Nails Thick disfigured discolored nails with subungual debris  from hallux to fifth toes bilaterally. No evidence of bacterial infection or drainage bilaterally.  Orthopedic  No limitations of motion  feet .  No crepitus or effusions noted.  No bony pathology or digital deformities noted.  Skin  normotropic skin with no porokeratosis noted bilaterally.  No signs of infections or ulcers noted.     Onychomycosis  Pain in right toes  Pain in left toes  Consent was obtained for treatment procedures.   Mechanical debridement of nails 1-5  bilaterally performed with a nail nipper.  Filed with dremel without incident.    Return office visit     3 months                 Told patient to return for periodic foot care and evaluation due to potential at risk complications.   Gardiner Barefoot DPM

## 2024-05-09 ENCOUNTER — Encounter: Payer: Self-pay | Admitting: Internal Medicine

## 2024-05-09 ENCOUNTER — Ambulatory Visit: Admitting: Internal Medicine

## 2024-05-09 VITALS — BP 110/60 | HR 65 | Temp 97.2°F | Ht 64.0 in | Wt 160.0 lb

## 2024-05-09 DIAGNOSIS — E538 Deficiency of other specified B group vitamins: Secondary | ICD-10-CM

## 2024-05-09 DIAGNOSIS — N1832 Chronic kidney disease, stage 3b: Secondary | ICD-10-CM

## 2024-05-09 DIAGNOSIS — N1831 Chronic kidney disease, stage 3a: Secondary | ICD-10-CM

## 2024-05-09 DIAGNOSIS — E559 Vitamin D deficiency, unspecified: Secondary | ICD-10-CM

## 2024-05-09 LAB — COMPREHENSIVE METABOLIC PANEL WITH GFR
ALT: 7 U/L (ref 0–35)
AST: 14 U/L (ref 0–37)
Albumin: 3.5 g/dL (ref 3.5–5.2)
Alkaline Phosphatase: 54 U/L (ref 39–117)
BUN: 32 mg/dL — ABNORMAL HIGH (ref 6–23)
CO2: 28 meq/L (ref 19–32)
Calcium: 9.2 mg/dL (ref 8.4–10.5)
Chloride: 104 meq/L (ref 96–112)
Creatinine, Ser: 1.19 mg/dL (ref 0.40–1.20)
GFR: 39.34 mL/min — ABNORMAL LOW (ref >60.00)
Glucose, Bld: 151 mg/dL — ABNORMAL HIGH (ref 70–99)
Potassium: 4.3 meq/L (ref 3.5–5.1)
Sodium: 140 meq/L (ref 135–145)
Total Bilirubin: 0.4 mg/dL (ref 0.2–1.2)
Total Protein: 6.7 g/dL (ref 6.0–8.3)

## 2024-05-09 LAB — TSH: TSH: 3.88 u[IU]/mL (ref 0.35–5.50)

## 2024-05-09 NOTE — Assessment & Plan Note (Signed)
Continue with vitamin B12.  Obtain vitamin B12 level

## 2024-05-09 NOTE — Assessment & Plan Note (Signed)
Hydrate well ?Monitor GFR ?

## 2024-05-09 NOTE — Progress Notes (Unsigned)
 Subjective:  Patient ID: Holly Banks, female    DOB: 12/31/1928  Age: 89 y.o. MRN: [id number]  CC: Medical Management of Chronic Issues (35mo follow up; no concerns)   HPI Holly Banks presents for HTN,  Pt is at [facility]  Outpatient Medications Prior to Visit  Medication Sig Dispense Refill   aspirin  EC 81 MG tablet Take 81 mg by mouth daily. Swallow whole.     b complex vitamins capsule Take 1 capsule by mouth daily. 100 capsule 3   ferrous sulfate  325 (65 FE) MG tablet Take 1 tablet (325 mg total) by mouth daily. 90 tablet 1   hydrochlorothiazide  (HYDRODIURIL ) 12.5 MG tablet Take 1 tablet (12.5 mg total) by mouth daily. 90 tablet 3   losartan  (COZAAR ) 50 MG tablet TAKE 1 TABLET EVERY DAY  FOR  HYPERTENSION 90 tablet 3   metFORMIN  (GLUCOPHAGE ) 500 MG tablet Take 1 tablet (500 mg total) by mouth 2 (two) times daily with a meal. Must keep scheduled appt for future refills 180 tablet 3   No facility-administered medications prior to visit.    ROS: Review of Systems  Constitutional:  Positive for fatigue. Negative for activity change, appetite change, chills and unexpected weight change.  HENT:  Negative for congestion, mouth sores and sinus pressure.   Eyes:  Negative for visual disturbance.  Respiratory:  Negative for cough and chest tightness.   Gastrointestinal:  Negative for abdominal pain and nausea.  Genitourinary:  Positive for difficulty urinating and frequency. Negative for vaginal pain.  Musculoskeletal:  Positive for gait problem. Negative for back pain.  Skin:  Negative for pallor and rash.  Neurological:  Negative for dizziness, tremors, weakness, numbness and headaches.  Psychiatric/Behavioral:  Positive for decreased concentration. Negative for behavioral problems, confusion and sleep disturbance.     Objective:  BP 110/60   Pulse 65   Temp (!) 97.2 F (36.2 C)   Ht 5' 4 (1.626 m)   Wt 160 lb (72.6 kg)   SpO2 95%   BMI 27.46 kg/m   BP Readings  from Last 3 Encounters:  05/09/24 110/60  02/08/24 (!) 140/78  02/08/24 (!) 140/78    Wt Readings from Last 3 Encounters:  05/09/24 160 lb (72.6 kg)  02/08/24 160 lb (72.6 kg)  02/08/24 160 lb (72.6 kg)    Physical Exam Constitutional:      General: She is not in acute distress.    Appearance: She is well-developed.  HENT:     Head: Normocephalic.     Right Ear: External ear normal.     Left Ear: External ear normal.     Nose: Nose normal.  Eyes:     General:        Right eye: No discharge.        Left eye: No discharge.     Conjunctiva/sclera: Conjunctivae normal.     Pupils: Pupils are equal, round, and reactive to light.  Neck:     Thyroid : No thyromegaly.     Vascular: No JVD.     Trachea: No tracheal deviation.  Cardiovascular:     Rate and Rhythm: Normal rate and regular rhythm.     Heart sounds: Normal heart sounds.  Pulmonary:     Effort: No respiratory distress.     Breath sounds: No stridor. No wheezing.  Abdominal:     General: Bowel sounds are normal. There is no distension.     Palpations: Abdomen is soft. There is no mass.  Tenderness: There is no abdominal tenderness. There is no guarding or rebound.  Musculoskeletal:        General: No tenderness.     Cervical back: Normal range of motion and neck supple. No rigidity.  Lymphadenopathy:     Cervical: No cervical adenopathy.  Skin:    Findings: No erythema or rash.  Neurological:     Cranial Nerves: No cranial nerve deficit.     Motor: No abnormal muscle tone.     Coordination: Coordination normal.     Deep Tendon Reflexes: Reflexes normal.  Psychiatric:        Behavior: Behavior normal.        Thought Content: Thought content normal.        Judgment: Judgment normal.     Lab Results  Component Value Date   WBC 5.7 02/08/2024   HGB 9.8 (L) 02/08/2024   HCT 31.3 (L) 02/08/2024   PLT 223.0 02/08/2024   GLUCOSE 170 (H) 02/08/2024   GLUCOSE 170 (H) 02/08/2024   CHOL 198 05/04/2011    TRIG 116.0 05/04/2011   HDL 62.30 05/04/2011   LDLDIRECT 126.1 03/31/2010   LDLCALC 113 (H) 05/04/2011   ALT 7 02/08/2024   AST 14 02/08/2024   NA 140 02/08/2024   NA 140 02/08/2024   K 3.9 02/08/2024   K 3.9 02/08/2024   CL 106 02/08/2024   CL 106 02/08/2024   CREATININE 1.29 (H) 02/08/2024   CREATININE 1.29 (H) 02/08/2024   BUN 33 (H) 02/08/2024   BUN 33 (H) 02/08/2024   CO2 26 02/08/2024   CO2 26 02/08/2024   TSH 2.77 02/08/2024   INR 2.2 (H) 10/25/2007   HGBA1C 7.2 (H) 02/08/2024   MICROALBUR 26.2 (H) 06/30/2010    DG Pelvis 1-2 Views Result Date: 11/14/2023 CLINICAL DATA:  Decubitus wounds EXAM: PELVIS - 1 VIEW COMPARISON:  03/16/2019 FINDINGS: Multiple calcified uterine fibroids are seen. Bilateral hip replacement is again identified. Bony structures appear within normal limits. The sacrum of somewhat obscured by the calcified fibroids. No other focal abnormality is seen. IMPRESSION: Chronic changes stable from the prior exam. Electronically Signed   By: Oneil Devonshire M.D.   On: 11/14/2023 19:47    Assessment & Plan:   Problem List Items Addressed This Visit     CKD (chronic kidney disease) stage 3, GFR 30-59 ml/min (HCC) - Primary   Hydrate well.  Monitor GFR      Relevant Orders   Comprehensive metabolic panel with GFR   Hemoglobin A1c   TSH   Vitamin B 12 deficiency   Continue with vitamin B12.  Obtain vitamin B12 level      Relevant Orders   Comprehensive metabolic panel with GFR   Hemoglobin A1c   TSH   Vitamin D  deficiency   Continue on vitamin D .  Obtain vitamin D  level      Relevant Orders   Comprehensive metabolic panel with GFR   Hemoglobin A1c   TSH      No orders of the defined types were placed in this encounter.     Follow-up: Return in about 4 months (around 09/07/2024) for a follow-up visit.  Marolyn Noel, MD

## 2024-05-09 NOTE — Assessment & Plan Note (Signed)
Continue on vitamin D.  Obtain vitamin D level

## 2024-05-10 LAB — HEMOGLOBIN A1C: Hgb A1c MFr Bld: 6.1 % (ref 4.6–6.5)

## 2024-05-11 LAB — OPHTHALMOLOGY REPORT-SCANNED

## 2024-05-14 ENCOUNTER — Telehealth: Payer: Self-pay | Admitting: Internal Medicine

## 2024-05-14 ENCOUNTER — Ambulatory Visit: Payer: Self-pay | Admitting: Internal Medicine

## 2024-05-14 NOTE — Telephone Encounter (Signed)
 Patient's daughter left form for a handicap license plate.  Form placed in box up front. Dr. Patricia box

## 2024-05-15 NOTE — Telephone Encounter (Signed)
 Document has been placed on PCP desk to be reviewed and signed.

## 2024-05-23 NOTE — Telephone Encounter (Signed)
 Pts daughter has been made aware of paperwork being ready for pick-up and placed up front when she is ready.

## 2024-05-25 ENCOUNTER — Telehealth: Payer: Self-pay | Admitting: Internal Medicine

## 2024-05-25 NOTE — Telephone Encounter (Signed)
 Document placed on PCP desk.

## 2024-05-25 NOTE — Telephone Encounter (Signed)
 Patient dropped off document Handicap Placard, to be filled out by provider. Patient requested to send it back via Fax within 7-days. Document is located in providers tray at front office.Please advise at Mobile [phone] (mobile)

## 2024-06-12 ENCOUNTER — Ambulatory Visit (INDEPENDENT_AMBULATORY_CARE_PROVIDER_SITE_OTHER): Admitting: Podiatry

## 2024-06-12 ENCOUNTER — Encounter: Payer: Self-pay | Admitting: Podiatry

## 2024-06-12 DIAGNOSIS — B351 Tinea unguium: Secondary | ICD-10-CM

## 2024-06-12 DIAGNOSIS — M79674 Pain in right toe(s): Secondary | ICD-10-CM

## 2024-06-12 DIAGNOSIS — L02611 Cutaneous abscess of right foot: Secondary | ICD-10-CM | POA: Diagnosis not present

## 2024-06-12 DIAGNOSIS — E1142 Type 2 diabetes mellitus with diabetic polyneuropathy: Secondary | ICD-10-CM

## 2024-06-12 DIAGNOSIS — M79675 Pain in left toe(s): Secondary | ICD-10-CM

## 2024-06-12 DIAGNOSIS — L03031 Cellulitis of right toe: Secondary | ICD-10-CM | POA: Diagnosis not present

## 2024-06-12 NOTE — Progress Notes (Signed)
 This patient returns to my office for at risk foot care.  This patient requires this care by a professional since this patient will be at risk due to having diabetes and CKD.  This patient is unable to cut nails herself since the patient cannot reach her nails.These nails are painful walking and wearing shoes.  She presents to the office with female caregiver.This patient presents for at risk foot care today.  General Appearance  Alert, conversant and in no acute stress.  Vascular  Dorsalis pedis and posterior tibial  pulses are  weakly palpable  bilaterally.  Capillary return is within normal limits  bilaterally. Temperature is within normal limits  bilaterally.  Neurologic  Senn-Weinstein monofilament wire test within normal limits  bilaterally. Muscle power within normal limits bilaterally.  Nails Thick disfigured discolored nails with subungual debris  from hallux to fifth toes bilaterally. No evidence of bacterial infection or drainage bilaterally.  Orthopedic  No limitations of motion  feet .  No crepitus or effusions noted.  No bony pathology or digital deformities noted.  Skin  normotropic skin with no porokeratosis noted bilaterally.  Distal aspect fourth toe right foot has abscess with pus draining from the toe during nail debridement.   There is no redness, swelling noted to toe.  This is a superficial abscess with pus noted under necrotic tissue but not affecting the fourth toe.   Onychomycosis  Pain in right toes  Pain in left toes  Consent was obtained for treatment procedures.   Mechanical debridement of nails 1-5  bilaterally performed with a nail nipper.  Filed with dremel without incident. Incision and drainage superficial abscess/ DSD.  No antibiotics ordered  this visit since I drained all the pus from the toe.  She was instructed if this condition worsens to call the office.   Return office visit    3 months                  Told patient to return for periodic foot care and  evaluation due to potential at risk complications.   Cordella Bold DPM

## 2024-06-13 NOTE — Telephone Encounter (Unsigned)
 Copied from CRM [id number]. Topic: General - Other >> Jun 13, 2024 12:15 PM Alexandria E wrote: Reason for CRM: Patient's daughter stated she missed a phone call today from the office, maybe in regards to a handicap placard form. Agent could not locate if this was completed and ready for pick up yet, please reach out to daughter [phone] with clarification.

## 2024-06-14 NOTE — Telephone Encounter (Signed)
 Please inform pts daughter that corrected Handicap Placard form is ready for pick up.

## 2024-06-15 NOTE — Telephone Encounter (Signed)
 Pt's daughter has been informed.

## 2024-09-11 ENCOUNTER — Ambulatory Visit: Admitting: Podiatry

## 2025-02-14 ENCOUNTER — Ambulatory Visit
